# Patient Record
Sex: Male | Born: 1955 | Race: Black or African American | Hispanic: No | Marital: Married | Smoking: Never smoker
Health system: Southern US, Community
[De-identification: ages and names within clinical notes are randomized; demographics above are authoritative.]

## PROBLEM LIST (undated history)

## (undated) DIAGNOSIS — N200 Calculus of kidney: Secondary | ICD-10-CM

## (undated) DIAGNOSIS — R12 Heartburn: Secondary | ICD-10-CM

## (undated) DIAGNOSIS — F419 Anxiety disorder, unspecified: Secondary | ICD-10-CM

## (undated) DIAGNOSIS — M1990 Unspecified osteoarthritis, unspecified site: Secondary | ICD-10-CM

## (undated) DIAGNOSIS — I10 Essential (primary) hypertension: Secondary | ICD-10-CM

## (undated) DIAGNOSIS — R319 Hematuria, unspecified: Secondary | ICD-10-CM

## (undated) DIAGNOSIS — K859 Acute pancreatitis without necrosis or infection, unspecified: Secondary | ICD-10-CM

## (undated) DIAGNOSIS — F329 Major depressive disorder, single episode, unspecified: Secondary | ICD-10-CM

## (undated) DIAGNOSIS — F32A Depression, unspecified: Secondary | ICD-10-CM

## (undated) DIAGNOSIS — E785 Hyperlipidemia, unspecified: Secondary | ICD-10-CM

## (undated) HISTORY — PX: COLONOSCOPY: SHX174

## (undated) HISTORY — DX: Acute pancreatitis without necrosis or infection, unspecified: K85.90

## (undated) HISTORY — DX: Heartburn: R12

## (undated) HISTORY — DX: Calculus of kidney: N20.0

## (undated) HISTORY — DX: Anxiety disorder, unspecified: F41.9

## (undated) HISTORY — DX: Hematuria, unspecified: R31.9

## (undated) HISTORY — PX: NO PAST SURGERIES: SHX2092

## (undated) HISTORY — DX: Hyperlipidemia, unspecified: E78.5

## (undated) HISTORY — DX: Major depressive disorder, single episode, unspecified: F32.9

## (undated) HISTORY — DX: Unspecified osteoarthritis, unspecified site: M19.90

## (undated) HISTORY — DX: Depression, unspecified: F32.A

---

## 2012-03-15 ENCOUNTER — Emergency Department: Payer: Self-pay | Admitting: Emergency Medicine

## 2013-03-08 DIAGNOSIS — N433 Hydrocele, unspecified: Secondary | ICD-10-CM | POA: Insufficient documentation

## 2013-03-08 DIAGNOSIS — N403 Nodular prostate with lower urinary tract symptoms: Secondary | ICD-10-CM | POA: Insufficient documentation

## 2013-03-08 DIAGNOSIS — N138 Other obstructive and reflux uropathy: Secondary | ICD-10-CM | POA: Insufficient documentation

## 2013-03-22 DIAGNOSIS — N411 Chronic prostatitis: Secondary | ICD-10-CM | POA: Insufficient documentation

## 2013-03-22 DIAGNOSIS — K4020 Bilateral inguinal hernia, without obstruction or gangrene, not specified as recurrent: Secondary | ICD-10-CM | POA: Insufficient documentation

## 2014-12-06 DIAGNOSIS — R1906 Epigastric swelling, mass or lump: Secondary | ICD-10-CM | POA: Insufficient documentation

## 2015-08-25 DIAGNOSIS — M19012 Primary osteoarthritis, left shoulder: Secondary | ICD-10-CM

## 2015-08-25 DIAGNOSIS — M19011 Primary osteoarthritis, right shoulder: Secondary | ICD-10-CM | POA: Insufficient documentation

## 2015-08-27 DIAGNOSIS — R7303 Prediabetes: Secondary | ICD-10-CM | POA: Insufficient documentation

## 2015-10-03 DIAGNOSIS — R768 Other specified abnormal immunological findings in serum: Secondary | ICD-10-CM | POA: Insufficient documentation

## 2015-11-29 DIAGNOSIS — R339 Retention of urine, unspecified: Secondary | ICD-10-CM | POA: Insufficient documentation

## 2016-08-12 ENCOUNTER — Emergency Department: Payer: BLUE CROSS/BLUE SHIELD

## 2016-08-12 ENCOUNTER — Encounter: Payer: Self-pay | Admitting: Emergency Medicine

## 2016-08-12 ENCOUNTER — Emergency Department
Admission: EM | Admit: 2016-08-12 | Discharge: 2016-08-12 | Disposition: A | Discharge status: Home / Self Care | Payer: BLUE CROSS/BLUE SHIELD | Attending: Emergency Medicine | Admitting: Emergency Medicine

## 2016-08-12 DIAGNOSIS — I10 Essential (primary) hypertension: Secondary | ICD-10-CM | POA: Insufficient documentation

## 2016-08-12 DIAGNOSIS — F17210 Nicotine dependence, cigarettes, uncomplicated: Secondary | ICD-10-CM | POA: Insufficient documentation

## 2016-08-12 DIAGNOSIS — J390 Retropharyngeal and parapharyngeal abscess: Secondary | ICD-10-CM | POA: Insufficient documentation

## 2016-08-12 DIAGNOSIS — J36 Peritonsillar abscess: Secondary | ICD-10-CM

## 2016-08-12 DIAGNOSIS — Z79899 Other long term (current) drug therapy: Secondary | ICD-10-CM | POA: Insufficient documentation

## 2016-08-12 HISTORY — DX: Essential (primary) hypertension: I10

## 2016-08-12 LAB — CBC WITH DIFFERENTIAL/PLATELET
Basophils Absolute: 0 10*3/uL (ref 0–0.1)
Basophils Relative: 0 %
Eosinophils Absolute: 0.2 10*3/uL (ref 0–0.7)
Eosinophils Relative: 2 %
HCT: 43.6 % (ref 40.0–52.0)
Hemoglobin: 14.9 g/dL (ref 13.0–18.0)
Lymphocytes Relative: 19 %
Lymphs Abs: 1.5 10*3/uL (ref 1.0–3.6)
MCH: 30.3 pg (ref 26.0–34.0)
MCHC: 34.1 g/dL (ref 32.0–36.0)
MCV: 88.8 fL (ref 80.0–100.0)
Monocytes Absolute: 0.9 10*3/uL (ref 0.2–1.0)
Monocytes Relative: 11 %
Neutro Abs: 5.4 10*3/uL (ref 1.4–6.5)
Neutrophils Relative %: 68 %
Platelets: 282 10*3/uL (ref 150–440)
RBC: 4.91 MIL/uL (ref 4.40–5.90)
RDW: 14.2 % (ref 11.5–14.5)
WBC: 8.1 10*3/uL (ref 3.8–10.6)

## 2016-08-12 LAB — COMPREHENSIVE METABOLIC PANEL
ALT: 23 U/L (ref 17–63)
AST: 21 U/L (ref 15–41)
Albumin: 3.8 g/dL (ref 3.5–5.0)
Alkaline Phosphatase: 57 U/L (ref 38–126)
Anion gap: 7 (ref 5–15)
BUN: 17 mg/dL (ref 6–20)
CO2: 26 mmol/L (ref 22–32)
Calcium: 8.9 mg/dL (ref 8.9–10.3)
Chloride: 103 mmol/L (ref 101–111)
Creatinine, Ser: 1.04 mg/dL (ref 0.61–1.24)
GFR calc Af Amer: >60 mL/min (ref >60)
GFR calc non Af Amer: >60 mL/min (ref >60)
Glucose, Bld: 101 mg/dL — ABNORMAL HIGH (ref 65–99)
Potassium: 3.7 mmol/L (ref 3.5–5.1)
Sodium: 136 mmol/L (ref 135–145)
Total Bilirubin: 0.8 mg/dL (ref 0.3–1.2)
Total Protein: 7 g/dL (ref 6.5–8.1)

## 2016-08-12 MED ORDER — METHYLPREDNISOLONE SODIUM SUCC 125 MG IJ SOLR
125.0000 mg | Freq: Once | INTRAMUSCULAR | Status: AC
  Administered 2016-08-12: 125 mg via INTRAVENOUS

## 2016-08-12 MED ORDER — SODIUM CHLORIDE 0.9 % IV SOLN
3.0000 g | Freq: Once | INTRAVENOUS | Status: AC
  Administered 2016-08-12: 3 g via INTRAVENOUS
  Filled 2016-08-12: qty 3

## 2016-08-12 MED ORDER — IOPAMIDOL (ISOVUE-300) INJECTION 61%
75.0000 mL | Freq: Once | INTRAVENOUS | Status: AC | PRN
  Administered 2016-08-12: 75 mL via INTRAVENOUS
  Filled 2016-08-12: qty 75

## 2016-08-12 MED ORDER — AMOXICILLIN-POT CLAVULANATE 875-125 MG PO TABS: 1.0000 | ORAL_TABLET | Freq: Two times a day (BID) | ORAL | 0 refills | Status: AC

## 2016-08-12 MED ORDER — METHYLPREDNISOLONE SODIUM SUCC 125 MG IJ SOLR
125.0000 mg | Freq: Once | INTRAMUSCULAR | Status: DC
  Filled 2016-08-12: qty 2

## 2016-08-12 MED ORDER — AMOXICILLIN 500 MG PO CAPS: 500.0000 mg | ORAL_CAPSULE | Freq: Two times a day (BID) | ORAL | 0 refills | Status: AC

## 2016-08-12 MED ORDER — LIDOCAINE VISCOUS 2 % MT SOLN
15.0000 mL | Freq: Once | OROMUCOSAL | Status: AC
  Administered 2016-08-12: 15 mL via OROMUCOSAL
  Filled 2016-08-12: qty 15

## 2016-08-12 NOTE — ED Triage Notes (Signed)
Patient presents to the ED with severe sore throat since yesterday.  Patient is speaking clearly and maintaining secretions but appears uncomfortable.  Patient states, "it hurts to eat ice cream."

## 2016-08-12 NOTE — ED Provider Notes (Signed)
[facility] Emergency Department Provider Note  ____________________________________________  Time seen: Approximately 11:15 PM  I have reviewed the triage vital signs and the nursing notes.   HISTORY  Chief Complaint Sore Throat    HPI Carlos Reynolds is a 61 y.o. male presents to the emergency department with 10 out of 10 pharyngitis that became noticeable suddenly while patient was driving home last night. Patient states that he has had difficulty swallowing. He has noticed no voice changes. She is speaking in complete sentences and managing his own secretions. Patient's daughter states that patient had chills last night. Patient denies associated chest pain, chest tightness, shortness of breath, nausea, vomiting and abdominal pain. No alleviating measures have been attempted.   Past Medical History:  Diagnosis Date  . Hypertension     There are no active problems to display for this patient.   History reviewed. No pertinent surgical history.  Prior to Admission medications   Medication Sig Start Date End Date Taking? Authorizing Provider  hydrochlorothiazide (HYDRODIURIL) 25 MG tablet Take 25 mg by mouth daily.   Yes [provider]  meloxicam (MOBIC) 7.5 MG tablet Take 7.5 mg by mouth daily.   Yes [provider]  omeprazole (PRILOSEC) 40 MG capsule Take 40 mg by mouth daily.   Yes [provider]  tamsulosin (FLOMAX) 0.4 MG CAPS capsule Take 0.4 mg by mouth.   Yes [provider]  amoxicillin (AMOXIL) 500 MG capsule Take 1 capsule (500 mg total) by mouth 2 (two) times daily. 08/12/16 08/22/16  Orvil Feil, PA-C  amoxicillin-clavulanate (AUGMENTIN) 875-125 MG tablet Take 1 tablet by mouth 2 (two) times daily. 08/12/16 08/22/16  Orvil Feil, PA-C    Allergies Patient has no known allergies.  No family history on file.  Social History Social History  Substance Use Topics  . Smoking status: Current Every  Day Smoker    Packs/day: 0.50    Types: Cigarettes  . Smokeless tobacco: Never Used  . Alcohol use Yes     Comment: occasionally     Review of Systems  Constitutional: Patient has chills.  Eyes: No visual changes. No discharge ENT: Patient has pharyngitis.  Cardiovascular: no chest pain. Respiratory: no cough. No SOB. Gastrointestinal: No abdominal pain.  No nausea, no vomiting.  No diarrhea.  No constipation. Musculoskeletal: Negative for musculoskeletal pain. Skin: Negative for rash, abrasions, lacerations, ecchymosis. Neurological: Negative for headaches, focal weakness or numbness. ____________________________________________   PHYSICAL EXAM:  VITAL SIGNS: ED Triage Vitals  Enc Vitals Group     BP 08/12/16 1845 134/85     Pulse Rate 08/12/16 1845 83     Resp 08/12/16 1845 20     Temp 08/12/16 1845 98.8 F (37.1 C)     Temp Source 08/12/16 1845 Oral     SpO2 08/12/16 1845 98 %     Weight 08/12/16 1832 196 lb (88.9 kg)     Height 08/12/16 1832 5\' 9"  (1.753 m)     Head Circumference --      Peak Flow --      Pain Score 08/12/16 1832 10     Pain Loc --      Pain Edu? --      Excl. in GC? --     Constitutional: Alert and oriented. Well appearing and in no acute distress. Eyes: Conjunctivae are normal. PERRL. EOMI. Head: Atraumatic. ENT:      Mouth/Throat:  Posterior pharynx is mildly erythematous. Patient's left tonsil is  slightly larger than right. Uvula is midline. Cardiovascular: Normal rate, regular rhythm. Normal S1 and S2.  Good peripheral circulation. Respiratory: Normal respiratory effort without tachypnea or retractions. Lungs CTAB. Good air entry to the bases with no decreased or absent breath sounds. Musculoskeletal: Full range of motion to all extremities. No gross deformities appreciated. Neurologic:  Normal speech and language. No gross focal neurologic deficits are appreciated.  Skin:  Skin is warm, dry and intact. No rash noted. Psychiatric: Mood  and affect are normal. Speech and behavior are normal. Patient exhibits appropriate insight and judgement.  ____________________________________________   LABS (all labs ordered are listed, but only abnormal results are displayed)  Labs Reviewed  COMPREHENSIVE METABOLIC PANEL - Abnormal; Notable for the following:       Result Value   Glucose, Bld 101 (*)    All other components within normal limits  CBC WITH DIFFERENTIAL/PLATELET   ____________________________________________  EKG   ____________________________________________  RADIOLOGY Geraldo Pitter, personally viewed and evaluated these images (plain radiographs) as part of my medical decision making, as well as reviewing the written report by the radiologist.  Ct Soft Tissue Neck W Contrast  Result Date: 08/12/2016 CLINICAL DATA:  Severe sore throat for 2 days. Assess for retropharyngeal abscess. EXAM: CT NECK WITH CONTRAST TECHNIQUE: Multidetector CT imaging of the neck was performed using the standard protocol following the bolus administration of intravenous contrast. CONTRAST:  75mL ISOVUE-300 IOPAMIDOL (ISOVUE-300) INJECTION 61% COMPARISON:  None. FINDINGS: PHARYNX AND LARYNX: Enhancing 17 x 18 x 31 mm LEFT palatine tonsil mucosal to submucosal density extending to lingual tonsil. Mild stranding LEFT parapharyngeal fat tissue planes. 6 mm rim enhancing nodule axial 35/106. LEFT hypopharyngeal edema extending to LEFT glossopharyngeal old with effacement LEFT piriform sinus and, LEFT aryepiglottic fold edema. 10 x 11 mm nodule LEFT paraglottic fat. Small LEFT lateral neck and retropharyngeal effusion. Normal larynx. SALIVARY GLANDS: Normal. THYROID: Normal. LYMPH NODES: No lymphadenopathy by CT size criteria. VASCULAR: Mild calcific atherosclerosis of the aortic arch. LIMITED INTRACRANIAL: Normal. VISUALIZED ORBITS: Normal. MASTOIDS AND VISUALIZED PARANASAL SINUSES: Trace paranasal sinus mucosal thickening. Mastoid air cells are  well aerated. SKELETON: Nonacute.  Poor dentition. UPPER CHEST: Centrilobular emphysema, incompletely evaluated. No superior mediastinal lymphadenopathy. OTHER: None. IMPRESSION: LEFT tonsillitis and LEFT pharyngitis. 6 mm LEFT peritonsillar abscess. Small retropharyngeal and LEFT neck effusion. Recommend follow-up to exclude obstructing LEFT tonsillar mass. Patent airway. Electronically Signed   By: Awilda Metro M.D.   On: 08/12/2016 21:32    ____________________________________________    PROCEDURES  Procedure(s) performed:    Procedures    Medications  methylPREDNISolone sodium succinate (SOLU-MEDROL) 125 mg/2 mL injection 125 mg (125 mg Intravenous Given 08/12/16 1933)  iopamidol (ISOVUE-300) 61 % injection 75 mL (75 mLs Intravenous Contrast Given 08/12/16 2039)  lidocaine (XYLOCAINE) 2 % viscous mouth solution 15 mL (15 mLs Mouth/Throat Given 08/12/16 2105)  Ampicillin-Sulbactam (UNASYN) 3 g in sodium chloride 0.9 % 100 mL IVPB (0 g Intravenous Stopped 08/12/16 2249)     ____________________________________________   INITIAL IMPRESSION / ASSESSMENT AND PLAN / ED COURSE  Pertinent labs & imaging results that were available during my care of the patient were reviewed by me and considered in my medical decision making (see chart for details).  Review of the [place] CSRS was performed in accordance of the [facility] prior to dispensing any controlled drugs.     Assessment and plan: Retropharyngeal abscess Peritonsillar abscess Patient presents to the emergency department with pharyngitis. CBC and CMP were  reassuring. CT soft tissue neck reveals small peritonsillar and retropharyngeal abscesses. Dr. Elenore RotaJuengel was consulted. Dr. Elenore RotaJuengel reviewed the patient's case. Patient was discharged with amoxicillin and Augmentin and advised to follow-up with Dr.Juengel in 10 days. Patient was given Unasyn and Solu-Medrol in the emergency department. Vitals were reassuring prior to discharge. All  patient questions were answered.  ____________________________________________  FINAL CLINICAL IMPRESSION(S) / ED DIAGNOSES  Final diagnoses:  Retropharyngeal abscess  Peritonsillar abscess      NEW MEDICATIONS STARTED DURING THIS VISIT:  New Prescriptions   AMOXICILLIN (AMOXIL) 500 MG CAPSULE    Take 1 capsule (500 mg total) by mouth 2 (two) times daily.   AMOXICILLIN-CLAVULANATE (AUGMENTIN) 875-125 MG TABLET    Take 1 tablet by mouth 2 (two) times daily.        This chart was dictated using voice recognition software/Dragon. Despite best efforts to proofread, errors can occur which can change the meaning. Any change was purely unintentional.    Gasper LloydWoods, Maika Kaczmarek M, PA-C 08/12/16 2334    Phineas SemenGoodman, Gray[place], MD 08/12/16 [phone]

## 2016-08-12 NOTE — ED Notes (Signed)
Pt discharged to home.  Family member driving.  Discharge instructions reviewed.  Verbalized understanding.  No questions or concerns at this time.  Teach back verified.  Pt in NAD.  No items left in ED.   

## 2016-08-12 NOTE — ED Notes (Signed)
See triage note.states he developed sore throat since last pm   States pain is worse today  Having some diff swallowing fluids at this time

## 2016-11-18 ENCOUNTER — Ambulatory Visit (INDEPENDENT_AMBULATORY_CARE_PROVIDER_SITE_OTHER): Payer: Self-pay | Admitting: Nurse Practitioner

## 2016-11-18 ENCOUNTER — Encounter: Payer: Self-pay | Admitting: Nurse Practitioner

## 2016-11-18 VITALS — BP 116/71 | HR 59 | Temp 98.1°F | Ht 70.0 in | Wt 209.8 lb

## 2016-11-18 DIAGNOSIS — M19012 Primary osteoarthritis, left shoulder: Secondary | ICD-10-CM

## 2016-11-18 DIAGNOSIS — R3914 Feeling of incomplete bladder emptying: Secondary | ICD-10-CM

## 2016-11-18 DIAGNOSIS — F321 Major depressive disorder, single episode, moderate: Secondary | ICD-10-CM

## 2016-11-18 DIAGNOSIS — L309 Dermatitis, unspecified: Secondary | ICD-10-CM

## 2016-11-18 DIAGNOSIS — N529 Male erectile dysfunction, unspecified: Secondary | ICD-10-CM

## 2016-11-18 DIAGNOSIS — Z7689 Persons encountering health services in other specified circumstances: Secondary | ICD-10-CM

## 2016-11-18 DIAGNOSIS — N401 Enlarged prostate with lower urinary tract symptoms: Secondary | ICD-10-CM

## 2016-11-18 MED ORDER — FLUOXETINE HCL 20 MG PO CAPS: 20.0000 mg | ORAL_CAPSULE | Freq: Every day | ORAL | 3 refills | Status: DC

## 2016-11-18 MED ORDER — TAMSULOSIN HCL 0.4 MG PO CAPS: 0.8000 mg | ORAL_CAPSULE | Freq: Every day | ORAL | 5 refills | Status: DC

## 2016-11-18 MED ORDER — TRIAMCINOLONE ACETONIDE 0.025 % EX OINT: 1.0000 "application " | TOPICAL_OINTMENT | Freq: Two times a day (BID) | CUTANEOUS | 0 refills | Status: DC

## 2016-11-18 NOTE — Patient Instructions (Addendum)
Arnett, Thank you for coming in to clinic today.  1. For your prostate: - I have sent a referral to [facility]. [facility] [address] [place] [address] Phone: [phone]  2. For your shoulder:  - I would recommend orthopedics for consideration of surgery. - In the meantime, we will send you for pain management. [facility] Address: [address] [place], [place], [place] [address] Phone: [phone]  - Continue your diclofenac and acetaminophen only and rubs.  PSYCHIATRY / THERAPY-COUSENLING [facility]) [place] [address], [place], [place] [address] Phone: [phone]  [employer], available walk-in 9am-4pm M-F [address] [place], [place] [address] Hours: 9am - 4pm (M-F, walk in available) [phone]  [facility] Department [facility]   Address: [address] [place], [place], [place] [address] Hours: Tues 4:15PM-8PM // Weds 9am - 12pm // Magdalene Molly 1pm - 8pm (Closed other days) Phone: [phone]  Please schedule a follow-up appointment with Wilhelmina Mcardle, AGNP. Return in about 3 months (around 02/17/2017) for Hypertension.   If you have any other questions or concerns, please feel free to call the clinic or send a message through MyChart. You may also schedule an earlier appointment if necessary.  You will receive a survey after today's visit either digitally by e-mail or paper by [employer]. Your experiences and feedback matter to [place].  Please respond so we know how we are doing as we provide care for you.   Wilhelmina Mcardle, DNP, AGNP-BC Adult Gerontology Nurse Practitioner [facility], [facility]

## 2016-11-18 NOTE — Progress Notes (Signed)
Subjective:    Patient ID: Carlos Reynolds, male    DOB: 1955/07/25, 61 y.o.   MRN: [id number]  Carlos Reynolds is a 61 y.o. male presenting on 11/18/2016 for Establish Care (bilateral shoulder and hand pain, the pt think its associated w/ arthritis ) and Prostatitis (chronic prostatis, BPH. pt would like a referral to Urology )   HPI Establish Care New Provider Pt last seen by PCP about 1 year ago.  Obtain records from [facility].   Shoulder L>R and Hand Pain - Bilateral Pt believes pain is associated w/ his arthritis.  - Cortisone injections not helpful. - Has had difficulty w/ this for several years.   - Something wrong w/ elbow - can hear grinding, has had xray in past w/ "chipped bones." DIfficult to make fist in hands. - Has tried multiple rubs, Icy Hot, voltaren gel, lidocaine creams/sprays/patches.  No relief of pain. 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  He has worked recently as a Health visitor.  He previously worked Engineer, production. - Affects ability to use arms/cannot currently work.  Has not worked in about 10 months.  Has trouble buttoning top collar, doing manual work at home. - Sleep disrupted w/ pain and when moves, his arm pops (w/in joint) not out of socket (a "catch" that prevents movement).  This causes severe pain and takes time to resolve before sleep onset. Often needs wife to massage shoulder for resolution of pain.  Rash on neck: in different places - only during fall/winter (no change in products)  Prostatitis/ED Has had BPH and would like a referral to Urology. No erection w/o pain, and even then symptom onset about 3 years ago.   - After cystoscopy, has had symptoms. - gets up 2-3 times at night to void w/ limited flow, dribbling, double voiding. - Occurs every night. - has frequency and urgency (occasional incontinence) - Wears gym shorts/pajamas at home.     Depression Screening Patient notes  significant difficulty since being out of work related to shoulder pain, w/ ED/painful erections.  Does have days where he feels he would be better off dead, but has not developed a plan to kill himself.  Depression screen PHQ 2/9 11/18/2016  Decreased Interest 3  Down, Depressed, Hopeless 3  PHQ - 2 Score 6  Altered sleeping 3  Tired, decreased energy 3  Change in appetite 3  Feeling bad or failure about yourself  3  Trouble concentrating 3  Moving slowly or fidgety/restless 2  Suicidal thoughts 3  PHQ-9 Score 26  Difficult doing work/chores Very difficult   GAD 7 : Generalized Anxiety Score 11/18/2016  Nervous, Anxious, on Edge 3  Control/stop worrying 3  Worry too much - different things 3  Trouble relaxing 3  Restless 3  Easily annoyed or irritable 3  Afraid - awful might happen 3  Total GAD 7 Score 21  Anxiety Difficulty Very difficult    Past Medical History:  Diagnosis Date  . Hypertension    History reviewed. No pertinent surgical history. Social History   Social History  . Marital status: Married    Spouse name: N/A  . Number of children: N/A  . Years of education: N/A   Occupational History  . Not on file.   Social History Main Topics  . Smoking status: Current Every Day Smoker    Packs/day: 0.50    Years: 40.00    Types: Cigarettes  . Smokeless tobacco: Never Used  .  Alcohol use Yes     Comment: occasionally  . Drug use: No  . Sexual activity: Not Currently   Other Topics Concern  . Not on file   Social History Narrative  . No narrative on file   Family History  Problem Relation Age of Onset  . Heart disease Mother   . Colon cancer Mother   . Diabetes Father   . Prostate cancer Father   . Colon cancer Brother   . Heart disease Brother    Current Outpatient Prescriptions on File Prior to Visit  Medication Sig  . hydrochlorothiazide (HYDRODIURIL) 25 MG tablet Take 25 mg by mouth daily.   No current facility-administered medications on  file prior to visit.     Review of Systems  All other systems reviewed and are negative.  Per HPI unless specifically indicated above     Objective:    BP 116/71 (BP Location: Right Arm, Patient Position: Sitting, Cuff Size: Normal)   Pulse (!) 59   Temp 98.1 F (36.7 C) (Oral)   Ht  (1.778 m)   Wt 209 lb 12.8 oz (95.2 kg)   BMI 30.10 kg/m    Wt Readings from Last 3 Encounters:  11/18/16 209 lb 12.8 oz (95.2 kg)  08/12/16 196 lb (88.9 kg)    Physical Exam General - healthy, well-appearing, NAD HEENT - Normocephalic, atraumatic, PERRL, EOMI, patent nares w/o congestion, oropharynx clear, MMM Neck - supple, non-tender, no LAD, pustular rash w/ flakiness on left neck (c/w eczema) Heart - RRR, no murmurs heard Lungs - Clear throughout all lobes, no wheezing, crackles, or rhonchi. Normal work of breathing. Musculoskeletal - Bilateral Shoulder Inspection: Left shoulder w/ muscle atrophy (at rest, left shoulder lower than right)  Palpation: Non-tender to palpation over anterior, lateral, or posterior shoulder ROM: left shoulder w/ severely limited  ROM forward flexion/abduction at 90 degrees, posterior flexion 0 degrees, internal / external rotation normal.  Right shoulder w/ minimal limitation of ROM in all directions, but pain at extremes. Strength: Normal strength 4/5 flex/ext, ext rot / int rot, grip, pincer grasp 5/5 strength. Neurovascular: Distally intact pulses, sensation to light touch Extremeties - non-tender, no edema, cap refill < 2 seconds, peripheral pulses intact +2 bilaterally Skin - warm, dry Neuro - awake, alert, oriented x3, CN II-X intact, intact muscle strength 5/5 bilaterally, intact distal sensation to light touch, normal coordination, normal gait Psych - Depressed mood and normal affect, normal behavior   Results for orders placed or performed during the hospital encounter of 08/12/16  CBC with Differential  Result Value Ref Range   WBC 8.1 3.8 -  10.6 K/uL   RBC 4.91 4.40 - 5.90 MIL/uL   Hemoglobin 14.9 13.0 - 18.0 g/dL   HCT 16.1 09.6 - 04.5 %   MCV 88.8 80.0 - 100.0 fL   MCH 30.3 26.0 - 34.0 pg   MCHC 34.1 32.0 - 36.0 g/dL   RDW 40.9 81.1 - 91.4 %   Platelets 282 150 - 440 K/uL   Neutrophils Relative % 68 %   Neutro Abs 5.4 1.4 - 6.5 K/uL   Lymphocytes Relative 19 %   Lymphs Abs 1.5 1.0 - 3.6 K/uL   Monocytes Relative 11 %   Monocytes Absolute 0.9 0.2 - 1.0 K/uL   Eosinophils Relative 2 %   Eosinophils Absolute 0.2 0 - 0.7 K/uL   Basophils Relative 0 %   Basophils Absolute 0.0 0 - 0.1 K/uL  Comprehensive metabolic panel  Result  Value Ref Range   Sodium 136 135 - 145 mmol/L   Potassium 3.7 3.5 - 5.1 mmol/L   Chloride 103 101 - 111 mmol/L   CO2 26 22 - 32 mmol/L   Glucose, Bld 101 (H) 65 - 99 mg/dL   BUN 17 6 - 20 mg/dL   Creatinine, Ser 1.61 0.61 - 1.24 mg/dL   Calcium 8.9 8.9 - 09.6 mg/dL   Total Protein 7.0 6.5 - 8.1 g/dL   Albumin 3.8 3.5 - 5.0 g/dL   AST 21 15 - 41 U/L   ALT 23 17 - 63 U/L   Alkaline Phosphatase 57 38 - 126 U/L   Total Bilirubin 0.8 0.3 - 1.2 mg/dL   GFR calc non Af Amer >60 >60 mL/min   GFR calc Af Amer >60 >60 mL/min   Anion gap 7 5 - 15      Assessment & Plan:   Problem List Items Addressed This Visit      Musculoskeletal and Integument   Osteoarthritis of left shoulder - Primary    Severe arthritis in left shoulder w/ severely limited ROM and constant pain preventing activity and interfering with sleep.  Patient is likely out of viable treatment options for pain outside of shoulder joint replacement.  Has previously been evaluated by orthopedics and is not currently desiring surgery.  Plan: 1. Pain management clinic referral.  Pt currently declines orthopedic management.  May benefit from epidural procedures for pain. 2. Continue oral diclofenac and acetaminophen.  Advised to avoid all other NSAIDs. 3. Continue topical diclofenac.  4. Follow up as needed.      Relevant  Medications   diclofenac (VOLTAREN) 75 MG EC tablet   Other Relevant Orders   Ambulatory referral to Pain Clinic   Eczema    Rash and skin changes c/w eczema.  Mild reaction at this time.    Plan: 1. Start triamcinolone ointment 0.025% topically bid prn for eruptions of eczema. 2. Follow up as needed.        Genitourinary   Benign prostatic hyperplasia with incomplete bladder emptying    Worsening of symptoms since last urology visit.  Pt now w/ LUTS symptoms persistently throughout day and at night.  Has incomplete emptying, urgency, frequency, occasional incontinence, nocturia.  Plan: 1. Continue flomax. 2. Referral to [facility] to Dr. Apolinar Junes.  Pt requested male provider.  Has previously been seen by [facility], but desires care closer to his home in [place]. 3. Followup as needed.      Relevant Medications   tamsulosin (FLOMAX) 0.4 MG CAPS capsule   Other Relevant Orders   Ambulatory referral to Urology   Erectile dysfunction    Pt w/ painful erection and incomplete ability to obtain erection.  Is significant dissatisfier to his wife for sexual intimacy.   Plan: 1. Referral to urology.  Encouraged ongoing management of BPH and depression as possible causes.        Other   Current moderate episode of major depressive disorder without prior episode (HCC)    Moderate depression.  Complicating factors are inability to work r/t left shoulder arthritis, ED, BPH and incontinence symptoms. Pt states he has experienced SI, but has had no plan to carry out.  Significant anhedonia expressed w/ sleep disruption from multiple factors. PHQ9 and GAD7 scores indicate severe depression and severe anxiety.  Plan: 1. START fluoxetine 20 mg once daily. 2. Encouraged stress management strategies. 3. Recommended psychiatry for walk-in if experiencing SI regularly.  Also discussed  need to call 9-1-1 if develops SI w/ plan. 4. Recommend counseling also to assist pt w/ coping strategies for  life stressors - references provided. 5. Follow up 4 weeks.      Relevant Medications   FLUoxetine (PROZAC) 20 MG capsule    Other Visit Diagnoses    Encounter to establish care       Patient history reviewed.  Recent records available in [facility] Everywhere.      Meds ordered this encounter  Medications  . diclofenac (VOLTAREN) 75 MG EC tablet    Sig: TAKE 1 TABLET BY MOUTH TWICE DAILY WITH FOOD AS NEEDED FOR PAIN  . omeprazole (PRILOSEC) 20 MG capsule    Sig: TAKE 1 CAPSULE (20 MG) BY MOUTH ONCE DAILY  . DISCONTD: tamsulosin (FLOMAX) 0.4 MG CAPS capsule    Sig: Take 2 capsules (0.8 mg total) by mouth daily.    Dispense:  60 capsule    Refill:  5  . tamsulosin (FLOMAX) 0.4 MG CAPS capsule    Sig: Take 2 capsules (0.8 mg total) by mouth daily.    Dispense:  60 capsule    Refill:  5  . triamcinolone (KENALOG) 0.025 % ointment    Sig: Apply 1 application topically 2 (two) times daily.    Dispense:  30 g    Refill:  0  . FLUoxetine (PROZAC) 20 MG capsule    Sig: Take 1 capsule (20 mg total) by mouth daily.    Dispense:  30 capsule    Refill:  3     Follow up plan: Return in about 3 months (around 02/17/2017) for Hypertension. and 4 weeks depression.   Wilhelmina Mcardle, DNP, AGPCNP-BC Adult Gerontology Primary Care Nurse Practitioner [facility] 11/21/2016, 11:56 PM

## 2016-11-21 ENCOUNTER — Other Ambulatory Visit: Payer: Self-pay

## 2016-11-21 DIAGNOSIS — N401 Enlarged prostate with lower urinary tract symptoms: Secondary | ICD-10-CM | POA: Insufficient documentation

## 2016-11-21 DIAGNOSIS — L309 Dermatitis, unspecified: Secondary | ICD-10-CM | POA: Insufficient documentation

## 2016-11-21 DIAGNOSIS — R3914 Feeling of incomplete bladder emptying: Principal | ICD-10-CM

## 2016-11-21 DIAGNOSIS — F321 Major depressive disorder, single episode, moderate: Secondary | ICD-10-CM | POA: Insufficient documentation

## 2016-11-21 DIAGNOSIS — N529 Male erectile dysfunction, unspecified: Secondary | ICD-10-CM | POA: Insufficient documentation

## 2016-11-21 DIAGNOSIS — M19012 Primary osteoarthritis, left shoulder: Secondary | ICD-10-CM | POA: Insufficient documentation

## 2016-11-21 NOTE — Assessment & Plan Note (Addendum)
Rash and skin changes c/w eczema.  Mild reaction at this time.    Plan: 1. Start triamcinolone ointment 0.025% topically bid prn for eruptions of eczema. 2. Follow up as needed.

## 2016-11-21 NOTE — Assessment & Plan Note (Signed)
Worsening of symptoms since last urology visit.  Pt now w/ LUTS symptoms persistently throughout day and at night.  Has incomplete emptying, urgency, frequency, occasional incontinence, nocturia.  Plan: 1. Continue flomax. 2. Referral to [facility] to Dr. Apolinar Junes.  Pt requested male provider.  Has previously been seen by [facility], but desires care closer to his home in [place]. 3. Followup as needed.

## 2016-11-21 NOTE — Assessment & Plan Note (Signed)
Moderate depression.  Complicating factors are inability to work r/t left shoulder arthritis, ED, BPH and incontinence symptoms. Pt states he has experienced SI, but has had no plan to carry out.  Significant anhedonia expressed w/ sleep disruption from multiple factors. PHQ9 and GAD7 scores indicate severe depression and severe anxiety.  Plan: 1. START fluoxetine 20 mg once daily. 2. Encouraged stress management strategies. 3. Recommended psychiatry for walk-in if experiencing SI regularly.  Also discussed need to call 9-1-1 if develops SI w/ plan. 4. Recommend counseling also to assist pt w/ coping strategies for life stressors - references provided. 5. Follow up 4 weeks.

## 2016-11-21 NOTE — Telephone Encounter (Signed)
Open in error

## 2016-11-21 NOTE — Assessment & Plan Note (Signed)
Severe arthritis in left shoulder w/ severely limited ROM and constant pain preventing activity and interfering with sleep.  Patient is likely out of viable treatment options for pain outside of shoulder joint replacement.  Has previously been evaluated by orthopedics and is not currently desiring surgery.  Plan: 1. Pain management clinic referral.  Pt currently declines orthopedic management.  May benefit from epidural procedures for pain. 2. Continue oral diclofenac and acetaminophen.  Advised to avoid all other NSAIDs. 3. Continue topical diclofenac.  4. Follow up as needed.

## 2016-11-21 NOTE — Assessment & Plan Note (Signed)
Pt w/ painful erection and incomplete ability to obtain erection.  Is significant dissatisfier to his wife for sexual intimacy.   Plan: 1. Referral to urology.  Encouraged ongoing management of BPH and depression as possible causes.

## 2016-11-29 NOTE — Progress Notes (Signed)
12/02/2016 2:22 PM   Carlos Reynolds [birth year] [id number]  Referring provider: Galen Manila, NP [address] [place], [place] [address]  Chief Complaint  Patient presents with  . New Patient (Initial Visit)    BPH referred by Wilhelmina Mcardle NP    HPI: Patient is a 61 year old African-American male with BPH and LUT S and erectile dysfunction who is referred by Orson Ape NP for further evaluation and management.  BPH WITH LUTS  (prostate and/or bladder) His IPSS score today is 17, which is moderate lower urinary tract symptomatology.  He is unhappy with his quality life due to his urinary symptoms.  His PVR is 27 mL.  His major complaints today are frequency, urgency, nocturia x 2-3, urge incontinence, intermittency, hesitancy, straining to urinate and a weak urinary stream  He has had these symptoms for two months.  He denies any dysuria, hematuria or suprapubic pain.   He currently taking tamsulosin 0.4 mg.   He also denies any recent fevers, chills, nausea or vomiting.  Father was was 53 yo when diagnosed with fatal prostate cancer.        IPSS    Row Name 12/02/16 1300         International Prostate Symptom Score   How often have you had the sensation of not emptying your bladder? About half the time     How often have you had to urinate less than every two hours? Less than half the time     How often have you found you stopped and started again several times when you urinated? Less than half the time     How often have you found it difficult to postpone urination? Almost always     How often have you had a weak urinary stream? About half the time     How often have you had to strain to start urination? Not at All     How many times did you typically get up at night to urinate? 2 Times     Total IPSS Score 17       Quality of Life due to urinary symptoms   If you were to spend the rest of your life with your urinary condition just the way it is now how  would you feel about that? Unhappy        Score:  1-7 Mild 8-19 Moderate 20-35 Severe   Erectile dysfunction His SHIM score is 17, which is mild ED.   He has been having difficulty with erections for two months.   His major complaint is no erections.  His libido is preserved.   His risk factors for ED are age, BPH, HTN, depression and smoking.   He endorses painful erections.   The pain is located in the suprapubic region.  He is not having curvatures with his erections.   He is no longer having spontaneous erections.       SHIM    Row Name 12/02/16 1333         SHIM: Over the last 6 months:   How do you rate your confidence that you could get and keep an erection? Low     When you had erections with sexual stimulation, how often were your erections hard enough for penetration (entering your partner)? Sometimes (about half the time)     During sexual intercourse, how often were you able to maintain your erection after you had penetrated (entered) your partner? Sometimes (  about half the time)     During sexual intercourse, how difficult was it to maintain your erection to completion of intercourse? Slightly Difficult     When you attempted sexual intercourse, how often was it satisfactory for you? Almost Always or Always       SHIM Total Score   SHIM 17        Score: 1-7 Severe ED 8-11 Moderate ED 12-16 Mild-Moderate ED 17-21 Mild ED 22-25 No ED    Reviewed referral notes.    Worsening of symptoms since last urology visit.  Pt now w/ LUTS symptoms persistently throughout day and at night.  Has incomplete emptying, urgency, frequency, occasional incontinence, nocturia.  Plan: 1. Continue Flomax. 2. Referral to BUA to Dr. Apolinar Junes.  Pt requested male provider.  Has previously been seen by [facility], but desires care closer to his home in [place]. 3. Followup as needed.  Pt w/ painful erection and incomplete ability to obtain erection.  Is significant dissatisfier  to his wife for sexual intimacy.   Plan: 1. Referral to urology.  Encouraged ongoing management of BPH and depression as possible causes.  Last saw Dr. Achilles Dunk in 11/2015 He has continued to do very well with the use of the alpha-blocker. Ongoing use the medication is all that is indicated at this point. A follow-up has been scheduled in one year with repeat DRE and PSA as well as bladder scan. If he develops any significant worsening or change of voiding symptoms in the meantime, he is to contact us for further intervention. He was encouraged to maintain fluid intake. He is to notify us if there are any further problems or questions in the interim.  The primary encounter diagnosis was Nodular prostate with urinary obstruction. Diagnoses of Chronic prostatitis, Orchalgia, and Incomplete bladder emptying were also pertinent to this visit.     PMH: Past Medical History:  Diagnosis Date  . Anxiety   . Arthritis   . Depression   . Heartburn   . HLD (hyperlipidemia)   . Hypertension   . Kidney stone     Surgical History: History reviewed. No pertinent surgical history.  Home Medications:  Allergies as of 12/02/2016      Reactions   Shellfish Allergy Hives   Amlodipine Other (See Comments)   Possible hand swelling   Lisinopril Other (See Comments)   Cough      Medication List       Accurate as of 12/02/16  2:22 PM. Always use your most recent med list.          diclofenac 75 MG EC tablet Commonly known as:  VOLTAREN TAKE 1 TABLET BY MOUTH TWICE DAILY WITH FOOD AS NEEDED FOR PAIN   FLUoxetine 20 MG capsule Commonly known as:  PROZAC Take 1 capsule (20 mg total) by mouth daily.   hydrochlorothiazide 25 MG tablet Commonly known as:  HYDRODIURIL Take 25 mg by mouth daily.   omeprazole 20 MG capsule Commonly known as:  PRILOSEC TAKE 1 CAPSULE (20 MG) BY MOUTH ONCE DAILY   sildenafil 20 MG tablet Commonly known as:  REVATIO Take 3 to 5 tablets two hours before  intercouse on an empty stomach.  Do not take with nitrates.   tamsulosin 0.4 MG Caps capsule Commonly known as:  FLOMAX Take 2 capsules (0.8 mg total) by mouth daily.   triamcinolone 0.025 % ointment Commonly known as:  KENALOG Apply 1 application topically 2 (two) times daily.  Allergies:  Allergies  Allergen Reactions  . Shellfish Allergy Hives  . Amlodipine Other (See Comments)    Possible hand swelling  . Lisinopril Other (See Comments)    Cough    Family History: Family History  Problem Relation Age of Onset  . Heart disease Mother   . Colon cancer Mother   . Diabetes Father   . Prostate cancer Father   . Colon cancer Brother   . Heart disease Brother   . Kidney cancer Neg Hx   . Bladder Cancer Neg Hx     Social History:  reports that he has been smoking Cigarettes.  He has a 20.00 pack-year smoking history. He has never used smokeless tobacco. He reports that he drinks alcohol. He reports that he does not use drugs.  ROS: UROLOGY Frequent Urination?: Yes Hard to postpone urination?: Yes Burning/pain with urination?: No Get up at night to urinate?: Yes Leakage of urine?: Yes Urine stream starts and stops?: Yes Trouble starting stream?: Yes Do you have to strain to urinate?: Yes Blood in urine?: No Urinary tract infection?: No Sexually transmitted disease?: No Injury to kidneys or bladder?: No Painful intercourse?: Yes Weak stream?: Yes Erection problems?: Yes Penile pain?: Yes  Gastrointestinal Nausea?: No Vomiting?: No Indigestion/heartburn?: Yes Diarrhea?: No Constipation?: Yes  Constitutional Fever: No Night sweats?: No Weight loss?: No Fatigue?: Yes  Skin Skin rash/lesions?: Yes Itching?: Yes  Eyes Blurred vision?: Yes Double vision?: No  Ears/Nose/Throat Sore throat?: No Sinus problems?: Yes  Hematologic/Lymphatic Swollen glands?: No Easy bruising?: No  Cardiovascular Leg swelling?: No Chest pain?:  No  Respiratory Cough?: No Shortness of breath?: Yes  Endocrine Excessive thirst?: Yes  Musculoskeletal Back pain?: Yes Joint pain?: Yes  Neurological Headaches?: Yes Dizziness?: No  Psychologic Depression?: Yes Anxiety?: Yes  Physical Exam: BP 130/82   Pulse 80   Ht  (1.778 m)   Wt 210 lb 8 oz (95.5 kg)   BMI 30.20 kg/m   Constitutional: Well nourished. Alert and oriented, No acute distress. HEENT: [place] AT, moist mucus membranes. Trachea midline, no masses. Cardiovascular: No clubbing, cyanosis, or edema. Respiratory: Normal respiratory effort, no increased work of breathing. GI: Abdomen is soft, non tender, non distended, no abdominal masses. Liver and spleen not palpable.  No hernias appreciated.  Stool sample for occult testing is not indicated.   GU: No CVA tenderness.  No bladder fullness or masses.  Patient with circumcised phallus.   Urethral meatus is patent.  No penile discharge. No penile lesions or rashes. Scrotum without lesions, cysts, rashes and/or edema.  Testicles are located scrotally bilaterally. No masses are appreciated in the testicles. Left and right epididymis are normal. Rectal: Patient with  normal sphincter tone. Anus and perineum without scarring or rashes. No rectal masses are appreciated. Prostate is approximately 45 grams, no nodules are appreciated. Seminal vesicles are normal. Skin: No rashes, bruises or suspicious lesions. Lymph: No cervical or inguinal adenopathy. Neurologic: Grossly intact, no focal deficits, moving all 4 extremities. Psychiatric: Normal mood and affect.  Laboratory Data: PSA History  1.1 in 01/15  0.6 in 10/15 Lab Results  Component Value Date   WBC 8.1 08/12/2016   HGB 14.9 08/12/2016   HCT 43.6 08/12/2016   MCV 88.8 08/12/2016   PLT 282 08/12/2016    Lab Results  Component Value Date   CREATININE 1.04 08/12/2016    Lab Results  Component Value Date   AST 21 08/12/2016   Lab Results  Component  Value  Date   ALT 23 08/12/2016    I have reviewed the labs.   Pertinent Imaging: Results for JAHZIAH, SIMONIN (MRN [id number]) as of 12/02/2016 14:14  Ref. Range 12/02/2016 13:42  Scan Result Unknown 27   I have independently reviewed the films.    Assessment & Plan:    1. BPH with LUTS  - IPSS score is 17/5  - Continue conservative management, avoiding bladder irritants and timed voiding's  - most bothersome symptoms is/are urgency  - Continue tamsulosin 0.4 mg daily  - RTC pending PSA  2. Erectile dysfunction  - SHIM score is 17  - I explained to the patient that in order to achieve an erection it takes good functioning of the nervous system (parasympathetic and rs, sympathetic, sensory and motor), good blood flow into the erectile tissue of the penis and a desire to have sex  - I explained that conditions like diabetes, hypertension, coronary artery disease, peripheral vascular disease, smoking, alcohol consumption, age, sleep apnea and BPH can diminish the ability to have an erection  - we will obtain a serum testosterone level at this time; if it is abnormal we will need to repeat the study for confirmation  - A recent study published in Sex Med 2018 Apr 13 revealed moderate to vigorous aerobic exercise for 40 minutes 4 times per week can decrease erectile problems caused by physical inactivity, obesity, hypertension, metabolic syndrome and/or cardiovascular diseases  - We discussed trying a PDE5 inhibitor - he would like to try Sildenafil 20 mg, 3 to 5 tablets two hours prior to intercourse on an empty stomach, # 50; he is warned not to take medications that contain nitrates.  I also advised him of the side effects, such as: headache, flushing, dyspepsia, abnormal vision, nasal congestion, back pain, myalgia, nausea, dizziness, and rash.  - RTC pending testosterone level  3. Pelvic pain  - will reassess after blood work has returned  Return for pending PSA and testosterone  results.  These notes generated with voice recognition software. I apologize for typographical errors.  Michiel Cowboy, PA-C  [facility] [address], [address] [place], [place] [address] [phone]

## 2016-12-02 ENCOUNTER — Ambulatory Visit (INDEPENDENT_AMBULATORY_CARE_PROVIDER_SITE_OTHER): Payer: Self-pay | Admitting: Urology

## 2016-12-02 ENCOUNTER — Encounter: Payer: Self-pay | Admitting: Urology

## 2016-12-02 VITALS — BP 130/82 | HR 80 | Ht 70.0 in | Wt 210.5 lb

## 2016-12-02 DIAGNOSIS — R102 Pelvic and perineal pain: Secondary | ICD-10-CM

## 2016-12-02 DIAGNOSIS — N138 Other obstructive and reflux uropathy: Secondary | ICD-10-CM

## 2016-12-02 DIAGNOSIS — N529 Male erectile dysfunction, unspecified: Secondary | ICD-10-CM

## 2016-12-02 DIAGNOSIS — N401 Enlarged prostate with lower urinary tract symptoms: Secondary | ICD-10-CM

## 2016-12-02 LAB — BLADDER SCAN AMB NON-IMAGING: Scan Result: 27

## 2016-12-02 MED ORDER — SILDENAFIL CITRATE 20 MG PO TABS: ORAL_TABLET | ORAL | 3 refills | Status: DC

## 2016-12-03 ENCOUNTER — Telehealth: Payer: Self-pay

## 2016-12-03 DIAGNOSIS — E291 Testicular hypofunction: Secondary | ICD-10-CM

## 2016-12-03 LAB — TESTOSTERONE: TESTOSTERONE: 208 ng/dL — AB (ref 264–916)

## 2016-12-03 LAB — PSA: Prostate Specific Ag, Serum: 1.1 ng/mL (ref 0.0–4.0)

## 2016-12-03 NOTE — Telephone Encounter (Signed)
-----   Message from Harle Battiest, PA-C sent at 12/03/2016  7:38 AM EDT ----- Please let Mr. Dileonardo know that his PSA is normal, but his testosterone is low.  He will need two morning testosterone draws at least two days apart before 10 AM for confirmation.

## 2016-12-03 NOTE — Telephone Encounter (Signed)
Spoke with pt in reference to lab results. Made pt aware will need 2 more testosterones. Pt voiced understanding. Lab appt made and orders placed.

## 2016-12-04 ENCOUNTER — Other Ambulatory Visit: Payer: Self-pay

## 2016-12-04 DIAGNOSIS — E291 Testicular hypofunction: Secondary | ICD-10-CM

## 2016-12-05 ENCOUNTER — Other Ambulatory Visit: Payer: Self-pay

## 2016-12-05 LAB — TESTOSTERONE: Testosterone: 439 ng/dL (ref 264–916)

## 2016-12-11 ENCOUNTER — Telehealth: Payer: Self-pay

## 2016-12-11 NOTE — Telephone Encounter (Signed)
-----   Message from Harle BattiestShannon A McGowan, PA-C sent at 12/05/2016  7:45 AM EDT ----- Please let Mr. Sitar know that his testosterone is normal.

## 2016-12-11 NOTE — Telephone Encounter (Signed)
LMOM- labs are normal. 

## 2016-12-12 ENCOUNTER — Telehealth: Payer: Self-pay | Admitting: Urology

## 2016-12-12 NOTE — Telephone Encounter (Signed)
Please let Carlos Reynolds know that I would like to see him in one month for his ED and LU TS.

## 2016-12-13 NOTE — Telephone Encounter (Signed)
App made and mailed  Called patient and had to leave a message for him to call back  michelle

## 2016-12-19 ENCOUNTER — Ambulatory Visit: Payer: No Typology Code available for payment source | Admitting: Physical Therapy

## 2016-12-19 ENCOUNTER — Telehealth: Payer: Self-pay | Admitting: Nurse Practitioner

## 2016-12-19 NOTE — Telephone Encounter (Signed)
Pt called to check status of referral to pain clinic [phone]

## 2017-01-01 ENCOUNTER — Telehealth: Payer: Self-pay | Admitting: Nurse Practitioner

## 2017-01-01 NOTE — Telephone Encounter (Signed)
Pt called to check status of referral to pain management [phone]

## 2017-01-01 NOTE — Telephone Encounter (Signed)
The pt was notified that pain management have attempted to contact him several time. I gave him all the contact information so he could reach back out to them.

## 2017-01-14 NOTE — Progress Notes (Signed)
01/15/2017 11:40 AM   Carlos Reynolds 1956/02/14 [id number]  Referring provider: Galen Reynolds, Carlos Renee, NP [address] [place], [place] [id number]  Chief Complaint  Patient presents with  . Benign Prostatic Hypertrophy    HPI: Patient is a 61 year old African-American male with BPH and LUT S and erectile dysfunction and chronic pelvic pain who presents for a one month follow up with his wife, Carlos FurbishShante.    BPH WITH LUTS  (prostate and/or bladder) His IPSS score today is 16, which is moderate lower urinary tract symptomatology.  He is unhappy with his quality life due to his urinary symptoms.  His PVR is 55 mL.  His major complaints today are frequency, urgency, nocturia x 2-3, urge incontinence, intermittency and hesitancy.  He has had these symptoms for two months.  He denies any dysuria, hematuria or suprapubic pain.   He currently taking tamsulosin 0.4 mg.   He also denies any recent fevers, chills, nausea or vomiting.  Father was 61 yo when diagnosed with fatal prostate cancer.  His UA is negative.   IPSS    Row Name 12/02/16 1300 01/15/17 1000       International Prostate Symptom Score   How often have you had the sensation of not emptying your bladder?  About half the time  Less than half the time    How often have you had to urinate less than every two hours?  Less than half the time  About half the time    How often have you found you stopped and started again several times when you urinated?  Less than half the time  More than half the time    How often have you found it difficult to postpone urination?  Almost always  Not at All    How often have you had a weak urinary stream?  About half the time  About half the time    How often have you had to strain to start urination?  Not at All  Less than half the time    How many times did you typically get up at night to urinate?  2 Times  2 Times    Total IPSS Score  17  16      Quality of Life due to urinary symptoms   If you were to  spend the rest of your life with your urinary condition just the way it is now how would you feel about that?  Unhappy  Unhappy       Score:  1-7 Mild 8-19 Moderate 20-35 Severe   Erectile dysfunction His SHIM score is 12, which is mild to moderate ED.  His previous SHIM score was 17.  He has been having difficulty with erections for two months.   His major complaint is no erections.  His libido is preserved.   His risk factors for ED are age, BPH, HTN, depression and smoking.   He endorses painful erections.   The pain is located in the suprapubic region.  He is not having curvatures with his erections.   He is no longer having spontaneous erections.  His testosterone was found to be normal.  He was given a script for sildenafil one month ago.  He has not taken the medication as he states his testicles hurt too badly to have sexual intercourse.  His testosterone is normal.   SHIM    Row Name 12/02/16 1333 01/15/17 1001       SHIM: Over  the last 6 months:   How do you rate your confidence that you could get and keep an erection?  Low  Low    When you had erections with sexual stimulation, how often were your erections hard enough for penetration (entering your partner)?  Sometimes (about half the time)  Sometimes (about half the time)    During sexual intercourse, how often were you able to maintain your erection after you had penetrated (entered) your partner?  Sometimes (about half the time)  A Few Times (much less than half the time)    During sexual intercourse, how difficult was it to maintain your erection to completion of intercourse?  Slightly Difficult  Very Difficult    When you attempted sexual intercourse, how often was it satisfactory for you?  Almost Always or Always  Sometimes (about half the time)      SHIM Total Score   SHIM  17  12       Score: 1-7 Severe ED 8-11 Moderate ED 12-16 Mild-Moderate ED 17-21 Mild ED 22-25 No ED    Reviewed referral notes.     Worsening of symptoms since last urology visit.  Pt now w/ LUTS symptoms persistently throughout day and at night.  Has incomplete emptying, urgency, frequency, occasional incontinence, nocturia.  Plan: 1. Continue Flomax. 2. Referral to BUA to Dr. Apolinar Junes.  Pt requested male provider.  Has previously been seen by [facility], but desires care closer to his home in [place]. 3. Followup as needed.  Pt w/ painful erection and incomplete ability to obtain erection.  Is significant dissatisfier to his wife for sexual intimacy.   Plan: 1. Referral to urology.  Encouraged ongoing management of BPH and depression as possible causes.  Last saw Dr. Achilles Dunk in 11/2015 He has continued to do very well with the use of the alpha-blocker. Ongoing use the medication is all that is indicated at this point. A follow-up has been scheduled in one year with repeat DRE and PSA as well as bladder scan. If he develops any significant worsening or change of voiding symptoms in the meantime, he is to contact us for further intervention. He was encouraged to maintain fluid intake. He is to notify us if there are any further problems or questions in the interim.  The primary encounter diagnosis was Nodular prostate with urinary obstruction. Diagnoses of Chronic prostatitis, Orchalgia, and Incomplete bladder emptying were also pertinent to this visit.     PMH: Past Medical History:  Diagnosis Date  . Anxiety   . Arthritis   . Depression   . Heartburn   . HLD (hyperlipidemia)   . Hypertension   . Kidney stone     Surgical History: Past Surgical History:  Procedure Laterality Date  . NO PAST SURGERIES     verified with patient on 01/15/17    Home Medications:  Allergies as of 01/15/2017      Reactions   Shellfish Allergy Hives   Amlodipine Other (See Comments)   Possible hand swelling   Lisinopril Other (See Comments)   Cough      Medication List        Accurate as of 01/15/17 11:40 AM.  Always use your most recent med list.          diclofenac 75 MG EC tablet Commonly known as:  VOLTAREN TAKE 1 TABLET BY MOUTH TWICE DAILY WITH FOOD AS NEEDED FOR PAIN   FLUoxetine 20 MG capsule Commonly known as:  PROZAC Take 1  capsule (20 mg total) by mouth daily.   hydrochlorothiazide 25 MG tablet Commonly known as:  HYDRODIURIL Take 25 mg by mouth daily.   naproxen 250 MG tablet Commonly known as:  NAPROSYN Take 250 mg by mouth 2 (two) times daily with a meal.   omeprazole 20 MG capsule Commonly known as:  PRILOSEC TAKE 1 CAPSULE (20 MG) BY MOUTH ONCE DAILY   sildenafil 20 MG tablet Commonly known as:  REVATIO Take 3 to 5 tablets two hours before intercouse on an empty stomach.  Do not take with nitrates.   tamsulosin 0.4 MG Caps capsule Commonly known as:  FLOMAX Take 2 capsules (0.8 mg total) by mouth daily.       Allergies:  Allergies  Allergen Reactions  . Shellfish Allergy Hives  . Amlodipine Other (See Comments)    Possible hand swelling  . Lisinopril Other (See Comments)    Cough    Family History: Family History  Problem Relation Age of Onset  . Heart disease Mother   . Colon cancer Mother   . Diabetes Father   . Prostate cancer Father   . Colon cancer Brother   . Heart disease Brother   . Kidney cancer Neg Hx   . Bladder Cancer Neg Hx     Social History:  reports that he has been smoking cigarettes.  He has a 23.00 pack-year smoking history. he has never used smokeless tobacco. He reports that he drinks alcohol. He reports that he does not use drugs.  ROS: UROLOGY Frequent Urination?: Yes Hard to postpone urination?: Yes Burning/pain with urination?: No Get up at night to urinate?: Yes Leakage of urine?: No Urine stream starts and stops?: Yes Trouble starting stream?: Yes Do you have to strain to urinate?: No Blood in urine?: No Urinary tract infection?: No Sexually transmitted disease?: No Injury to kidneys or bladder?:  No Painful intercourse?: Yes Weak stream?: No Erection problems?: Yes Penile pain?: No  Gastrointestinal Nausea?: No Vomiting?: No Indigestion/heartburn?: Yes Diarrhea?: No Constipation?: No  Constitutional Fever: No Night sweats?: Yes Weight loss?: No Fatigue?: Yes  Skin Skin rash/lesions?: Yes Itching?: Yes  Eyes Blurred vision?: No Double vision?: No  Ears/Nose/Throat Sore throat?: No Sinus problems?: Yes  Hematologic/Lymphatic Swollen glands?: No Easy bruising?: No  Cardiovascular Leg swelling?: No Chest pain?: No  Respiratory Cough?: No Shortness of breath?: Yes  Endocrine Excessive thirst?: No  Musculoskeletal Back pain?: Yes Joint pain?: Yes  Neurological Headaches?: No Dizziness?: No  Psychologic Depression?: Yes Anxiety?: Yes  Physical Exam: BP 137/89   Pulse 60   Ht 5\' 10"  (1.778 m)   Wt 215 lb 11.2 oz (97.8 kg)   BMI 30.95 kg/m   Constitutional: Well nourished. Alert and oriented, No acute distress. HEENT: [place] AT, moist mucus membranes. Trachea midline, no masses. Cardiovascular: No clubbing, cyanosis, or edema. Respiratory: Normal respiratory effort, no increased work of breathing. Skin: No rashes, bruises or suspicious lesions. Lymph: No cervical or inguinal adenopathy. Neurologic: Grossly intact, no focal deficits, moving all 4 extremities. Psychiatric: Normal mood and affect.  Laboratory Data: PSA History  1.1 in 02/2013  0.6 in 11/2013  1.1 in 11/2016 Lab Results  Component Value Date   WBC 8.1 08/12/2016   HGB 14.9 08/12/2016   HCT 43.6 08/12/2016   MCV 88.8 08/12/2016   PLT 282 08/12/2016    Lab Results  Component Value Date   CREATININE 1.04 08/12/2016    Lab Results  Component Value Date   AST 21  08/12/2016   Lab Results  Component Value Date   ALT 23 08/12/2016    Urinalysis Negative.  See Epic.   Results for orders placed or performed in visit on 01/15/17  Bladder Scan (Post Void Residual)  in office  Result Value Ref Range   Scan Result 55 ml     I have reviewed the labs.  Assessment & Plan:    1. BPH with LUTS  - IPSS score is 16/5, slight improvement  - Continue conservative management, avoiding bladder irritants and timed voiding's  - most bothersome symptoms is/are urgency  - Continue tamsulosin 0.4 mg daily    2. Erectile dysfunction  - SHIM score is 12, it is worsening  - patient did not take the sildenafil due to pelvic pain  3. Pelvic pain  - will reassess after blood work has returned  - scrotal ultrasound is pending  Return for scrotal ultrasound reprort.  These notes generated with voice recognition software. I apologize for typographical errors.  Michiel Cowboy, PA-C  [facility] [address] [place], [place] [address] [phone]

## 2017-01-15 ENCOUNTER — Ambulatory Visit (INDEPENDENT_AMBULATORY_CARE_PROVIDER_SITE_OTHER): Payer: Self-pay | Admitting: Urology

## 2017-01-15 ENCOUNTER — Encounter: Payer: Self-pay | Admitting: Urology

## 2017-01-15 VITALS — BP 137/89 | HR 60 | Ht 70.0 in | Wt 215.7 lb

## 2017-01-15 DIAGNOSIS — R102 Pelvic and perineal pain: Secondary | ICD-10-CM

## 2017-01-15 DIAGNOSIS — N138 Other obstructive and reflux uropathy: Secondary | ICD-10-CM

## 2017-01-15 DIAGNOSIS — N529 Male erectile dysfunction, unspecified: Secondary | ICD-10-CM

## 2017-01-15 DIAGNOSIS — N401 Enlarged prostate with lower urinary tract symptoms: Secondary | ICD-10-CM

## 2017-01-15 DIAGNOSIS — N503 Cyst of epididymis: Secondary | ICD-10-CM

## 2017-01-15 DIAGNOSIS — N5082 Scrotal pain: Secondary | ICD-10-CM

## 2017-01-15 LAB — BLADDER SCAN AMB NON-IMAGING

## 2017-01-21 ENCOUNTER — Ambulatory Visit
Payer: Self-pay | Attending: Student in an Organized Health Care Education/Training Program | Admitting: Student in an Organized Health Care Education/Training Program

## 2017-01-21 ENCOUNTER — Ambulatory Visit
Admission: RE | Admit: 2017-01-21 | Discharge: 2017-01-21 | Disposition: A | Discharge status: Home / Self Care | Payer: No Typology Code available for payment source | Source: Ambulatory Visit | Attending: Urology | Admitting: Urology

## 2017-01-21 ENCOUNTER — Telehealth: Payer: Self-pay | Admitting: Urology

## 2017-01-21 ENCOUNTER — Encounter: Payer: Self-pay | Admitting: Student in an Organized Health Care Education/Training Program

## 2017-01-21 ENCOUNTER — Other Ambulatory Visit: Payer: Self-pay

## 2017-01-21 VITALS — BP 143/83 | HR 63 | Temp 97.8°F | Resp 16 | Ht 70.0 in | Wt 215.0 lb

## 2017-01-21 DIAGNOSIS — Z800 Family history of malignant neoplasm of digestive organs: Secondary | ICD-10-CM | POA: Insufficient documentation

## 2017-01-21 DIAGNOSIS — M25512 Pain in left shoulder: Secondary | ICD-10-CM | POA: Insufficient documentation

## 2017-01-21 DIAGNOSIS — Z888 Allergy status to other drugs, medicaments and biological substances status: Secondary | ICD-10-CM | POA: Insufficient documentation

## 2017-01-21 DIAGNOSIS — Z8042 Family history of malignant neoplasm of prostate: Secondary | ICD-10-CM | POA: Insufficient documentation

## 2017-01-21 DIAGNOSIS — Z8249 Family history of ischemic heart disease and other diseases of the circulatory system: Secondary | ICD-10-CM | POA: Insufficient documentation

## 2017-01-21 DIAGNOSIS — M19012 Primary osteoarthritis, left shoulder: Secondary | ICD-10-CM | POA: Insufficient documentation

## 2017-01-21 DIAGNOSIS — F419 Anxiety disorder, unspecified: Secondary | ICD-10-CM | POA: Insufficient documentation

## 2017-01-21 DIAGNOSIS — I10 Essential (primary) hypertension: Secondary | ICD-10-CM | POA: Insufficient documentation

## 2017-01-21 DIAGNOSIS — M1990 Unspecified osteoarthritis, unspecified site: Secondary | ICD-10-CM | POA: Insufficient documentation

## 2017-01-21 DIAGNOSIS — G894 Chronic pain syndrome: Secondary | ICD-10-CM | POA: Insufficient documentation

## 2017-01-21 DIAGNOSIS — Z79899 Other long term (current) drug therapy: Secondary | ICD-10-CM | POA: Insufficient documentation

## 2017-01-21 DIAGNOSIS — F329 Major depressive disorder, single episode, unspecified: Secondary | ICD-10-CM | POA: Insufficient documentation

## 2017-01-21 DIAGNOSIS — N5082 Scrotal pain: Secondary | ICD-10-CM | POA: Insufficient documentation

## 2017-01-21 DIAGNOSIS — Z91013 Allergy to seafood: Secondary | ICD-10-CM | POA: Insufficient documentation

## 2017-01-21 DIAGNOSIS — Z833 Family history of diabetes mellitus: Secondary | ICD-10-CM | POA: Insufficient documentation

## 2017-01-21 DIAGNOSIS — Z87442 Personal history of urinary calculi: Secondary | ICD-10-CM | POA: Insufficient documentation

## 2017-01-21 DIAGNOSIS — E785 Hyperlipidemia, unspecified: Secondary | ICD-10-CM | POA: Insufficient documentation

## 2017-01-21 DIAGNOSIS — N400 Enlarged prostate without lower urinary tract symptoms: Secondary | ICD-10-CM | POA: Insufficient documentation

## 2017-01-21 DIAGNOSIS — G8929 Other chronic pain: Secondary | ICD-10-CM

## 2017-01-21 DIAGNOSIS — Z87891 Personal history of nicotine dependence: Secondary | ICD-10-CM | POA: Insufficient documentation

## 2017-01-21 DIAGNOSIS — N529 Male erectile dysfunction, unspecified: Secondary | ICD-10-CM | POA: Insufficient documentation

## 2017-01-21 DIAGNOSIS — F32A Depression, unspecified: Secondary | ICD-10-CM

## 2017-01-21 DIAGNOSIS — I861 Scrotal varices: Secondary | ICD-10-CM | POA: Insufficient documentation

## 2017-01-21 DIAGNOSIS — N503 Cyst of epididymis: Secondary | ICD-10-CM | POA: Insufficient documentation

## 2017-01-21 MED ORDER — GABAPENTIN 300 MG PO CAPS: ORAL_CAPSULE | ORAL | 1 refills | Status: DC

## 2017-01-21 MED ORDER — DICLOFENAC SODIUM 75 MG PO TBEC: 75.0000 mg | DELAYED_RELEASE_TABLET | Freq: Two times a day (BID) | ORAL | 2 refills | Status: DC

## 2017-01-21 NOTE — Patient Instructions (Addendum)
-  Urine drug screen today. -Referral to pain psychology regarding risk of SUD -Start gabapentin 300 mg nightly times 2 weeks and increase to 300 mg twice daily. -Increase diclofenac to 75 mg twice daily.  Stop naproxen -We will consider left shoulder MRI in the future.  Patient wants to hold off at this time.  May need sedation for this-okay to write for Ativan or Valium prior to MRI.

## 2017-01-21 NOTE — Progress Notes (Signed)
Safety precautions to be maintained throughout the outpatient stay will include: orient to surroundings, keep bed in low position, maintain call bell within reach at all times, provide assistance with transfer out of bed and ambulation.  

## 2017-01-21 NOTE — Telephone Encounter (Signed)
Pt came in and left urine sample.  He stated he couldn't go when he was here for appt, but had been told to come back by to leave sample.

## 2017-01-21 NOTE — Progress Notes (Signed)
Patient's Name: Carlos Reynolds  MRN: [id number]  Referring Provider: Mikey College, *  DOB: 1955/04/16  PCP: Mikey College, NP  DOS: 01/21/2017  Note by: Gillis Santa, MD  Service setting: Ambulatory outpatient  Specialty: Interventional Pain Management  Location: [facility] (AMB) Pain Management Facility  Visit type: Initial Patient Evaluation  Patient type: New Patient   Primary Reason(s) for Visit: Encounter for initial evaluation of one or more chronic problems (new to examiner) potentially causing chronic pain, and posing a threat to normal musculoskeletal function. (Level of risk: High) CC: Shoulder Pain (right)  HPI  Carlos Reynolds is a 61 y.o. year old, male patient, who comes today to see [place] for the first time for an initial evaluation of his chronic pain. He has Osteoarthritis of left shoulder; Benign prostatic hyperplasia with incomplete bladder emptying; Erectile dysfunction; Current moderate episode of major depressive disorder without prior episode ([place]); Eczema; Depression; Chronic left shoulder pain; and Chronic pain syndrome on their problem list. Today he comes in for evaluation of his Shoulder Pain (right)  Pain Assessment: Location: Left Shoulder Radiating: left arm to elbow and joints in fingers Onset: More than a month ago Duration: Chronic pain Quality: Throbbing, Constant(grinding) Severity: 10-Worst pain ever/10 (self-reported pain score)  Note: Reported level is inconsistent with clinical observations. Clinically the patient looks like a 3/10 A 3/10 is viewed as "Moderate" and described as significantly interfering with activities of daily living (ADL). It becomes difficult to feed, bathe, get dressed, get on and off the toilet or to perform personal hygiene functions. Difficult to get in and out of bed or a chair without assistance. Very distracting. With effort, it can be ignored when deeply involved in activities.       When using our objective Pain Scale, levels  between 6 and 10/10 are said to belong in an emergency room, as it progressively worsens from a 6/10, described as severely limiting, requiring emergency care not usually available at an outpatient pain management facility. At a 6/10 level, communication becomes difficult and requires great effort. Assistance to reach the emergency department may be required. Facial flushing and profuse sweating along with potentially dangerous increases in heart rate and blood pressure will be evident. Effect on ADL: cant do anything.  Timing: Constant Modifying factors:  heating pad naproxin   Onset and Duration: Date of onset: 4 years Cause of pain: Unknown Severity: Getting worse, NAS-11 at its worse: 10/10, NAS-11 at its best: 8/10, NAS-11 now: 10/10 and NAS-11 on the average: 8/10 Timing: Night Aggravating Factors: Motion Alleviating Factors: Medications Associated Problems: Erectile dysfunction, Numbness and Pain that wakes patient up Quality of Pain: Aching Previous Examinations or Tests: X-rays Previous Treatments: Epidural steroid injections and Physical Therapy  The patient comes into the clinics today for the first time for a chronic pain management evaluation.   61 year old male who presents with left shoulder pain that is severe in nature secondary to left shoulder osteoarthritis, left shoulder rotator cuff tendinitis with acromioclavicular and humeral arthritis.  Patient has severely limited range of motion of his left shoulder with limited left shoulder abduction and internal rotation.  He states that his left shoulder pain impacts his sleeping, ability to perform activities of daily living.  This is resulted in sadness and depression because he feels guilty that he cannot help his wife with work and paying the bills.  Patient has been told that he may need a shoulder replacement surgery.  He has tried shoulder joint injections with  cortisone which were not effective.  Patient also tried physical  therapy which was not effective for his left shoulder.  In terms of pain medications, patient is currently on diclofenac 75 mg daily along with naproxen.  He takes Prozac 20 mg for his depression.  Today I took the time to provide the patient with information regarding my pain practice. The patient was informed that my practice is divided into two sections: an interventional pain management section, as well as a completely separate and distinct medication management section. I explained that I have procedure days for my interventional therapies, and evaluation days for follow-ups and medication management. Because of the amount of documentation required during both, they are kept separated. This means that there is the possibility that he may be scheduled for a procedure on one day, and medication management the next. I have also informed him that because of staffing and facility limitations, I no longer take patients for medication management only. To illustrate the reasons for this, I gave the patient the example of surgeons, and how inappropriate it would be to refer a patient to his/her care, just to write for the post-surgical antibiotics on a surgery done by a different surgeon.   Because interventional pain management is my board-certified specialty, the patient was informed that joining my practice means that they are open to any and all interventional therapies. I made it clear that this does not mean that they will be forced to have any procedures done. What this means is that I believe interventional therapies to be essential part of the diagnosis and proper management of chronic pain conditions. Therefore, patients not interested in these interventional alternatives will be better served under the care of a different practitioner.  The patient was also made aware of my Comprehensive Pain Management Safety Guidelines where by joining my practice, they limit all of their nerve blocks and joint  injections to those done by our practice, for as long as we are retained to manage their care.   Historic Controlled Substance Pharmacotherapy Review  PMP and historical list of controlled substances: Percocet 5 g, quantity 60, last filled 07/14/2015.  Not on any opioid therapy. Highest recorded MME/day: 30 mg/day Medications: Patient brought medications to be checked, as requested Pharmacodynamics: Desired effects: Analgesia: The patient reports 50% benefit. Reported improvement in function: The patient reports medication allows him to have a normal productive life, including accomplishing all ADLs. Clinically meaningful improvement in function (CMIF): Sustained CMIF goals met Perceived effectiveness: Described as relatively effective but with some room for improvement Undesirable effects: Side-effects or Adverse reactions: None reported Historical Monitoring: The patient  reports that he does not use drugs. List of all UDS Test(s): No results found for: MDMA, COCAINSCRNUR, [place], [place], CANNABQUANT, [place], [place] List of other Serum/Urine Drug Screening Test(s):  No results found for: AMPHSCRSER, BARBSCRSER, BENZOSCRSER, COCAINSCRSER, COCAINSCRNUR, PCPSCRSER, PCPQUANT, THCSCRSER, THCU, CANNABQUANT, OPIATESCRSER, OXYSCRSER, PROPOXSCRSER, ETH Historical Background Evaluation: [place] PMP: Six (6) year initial data search conducted.             PMP NARX Score Report:  Narcotic: 80 Sedative: 30 Stimulant: 0 [place] Department of public safety, offender search: Editor, commissioning Information) Non-contributory Risk Assessment Profile: Aberrant behavior: None observed or detected today Risk factors for fatal opioid overdose: None identified today PMP NARX Overdose Risk Score: 190 Fatal overdose hazard ratio (HR): Calculation deferred Non-fatal overdose hazard ratio (HR): Calculation deferred Risk of opioid abuse or dependence: 0.7-3.0% with doses ? 36 MME/day and 6.1-26% with  doses ? 120 MME/day. Substance  use disorder (SUD) risk level: Pending results of Medical Psychology Evaluation for SUD Opioid risk tool (ORT) (Total Score): 4 Opioid Risk Tool - 01/21/17 1313      Family History of Substance Abuse   Alcohol  Positive Male    Illegal Drugs  Negative    Rx Drugs  Negative      Personal History of Substance Abuse   Alcohol  Negative    Illegal Drugs  Negative    Rx Drugs  Negative      Age   Age between 54-45 years   No      History of Preadolescent Sexual Abuse   History of Preadolescent Sexual Abuse  Negative or Male      Psychological Disease   Psychological Disease  Negative    Depression  Positive      Total Score   Opioid Risk Tool Scoring  4    Opioid Risk Interpretation  Moderate Risk      ORT Scoring interpretation table:  Score <3 = Low Risk for SUD  Score between 4-7 = Moderate Risk for SUD  Score >8 = High Risk for Opioid Abuse   PHQ-2 Depression Scale:  Total score: 6  PHQ-2 Scoring interpretation table: (Score and probability of major depressive disorder)  Score 0 = No depression  Score 1 = 15.4% Probability  Score 2 = 21.1% Probability  Score 3 = 38.4% Probability  Score 4 = 45.5% Probability  Score 5 = 56.4% Probability  Score 6 = 78.6% Probability   PHQ-9 Depression Scale:  Total score: 24  PHQ-9 Scoring interpretation table:  Score 0-4 = No depression  Score 5-9 = Mild depression  Score 10-14 = Moderate depression  Score 15-19 = Moderately severe depression  Score 20-27 = Severe depression (2.4 times higher risk of SUD and 2.89 times higher risk of overuse)   Pharmacologic Plan: Pending ordered tests and/or consults            Initial impression: Pending review of available data and ordered tests.  Meds   Current Outpatient Medications:  .  diclofenac (VOLTAREN) 75 MG EC tablet, Take 1 tablet (75 mg total) by mouth 2 (two) times daily., Disp: 60 tablet, Rfl: 2 .  FLUoxetine (PROZAC) 20 MG capsule, Take 1 capsule (20 mg total) by mouth  daily., Disp: 30 capsule, Rfl: 3 .  hydrochlorothiazide (HYDRODIURIL) 25 MG tablet, Take 25 mg by mouth daily., Disp: , Rfl:  .  omeprazole (PRILOSEC) 20 MG capsule, TAKE 1 CAPSULE (20 MG) BY MOUTH ONCE DAILY, Disp: , Rfl:  .  tamsulosin (FLOMAX) 0.4 MG CAPS capsule, Take 2 capsules (0.8 mg total) by mouth daily., Disp: 60 capsule, Rfl: 5 .  gabapentin (NEURONTIN) 300 MG capsule, 300 mg qhs for 2 weeks then 300 mg BID, Disp: 60 capsule, Rfl: 1 .  sildenafil (REVATIO) 20 MG tablet, Take 3 to 5 tablets two hours before intercouse on an empty stomach.  Do not take with nitrates. (Patient not taking: Reported on 01/15/2017), Disp: 50 tablet, Rfl: 3  Imaging Review   Left shoulder x-ray: 10/14/2015 No acute fracture or dislocation is identified. Mild acromioclavicular and glenohumeral joint narrowing with marginal osteophytosis and additional mild irregularity of the left greater tuberosity is present, similar to prior examination. No evidence of joint effusion, soft tissue abnormality, or radiopaque foreign body. Visualized left hemithorax is unremarkable.  Complexity Note: Imaging results reviewed. Results shared with Carlos Reynolds, using Layman's terms.  ROS  Cardiovascular History: High blood pressure Pulmonary or Respiratory History: Shortness of breath Neurological History: No reported neurological signs or symptoms such as seizures, abnormal skin sensations, urinary and/or fecal incontinence, being born with an abnormal open spine and/or a tethered spinal cord Review of Past Neurological Studies: No results found for this or any previous visit. Psychological-Psychiatric History: Depressed Gastrointestinal History: No reported gastrointestinal signs or symptoms such as vomiting or evacuating blood, reflux, heartburn, alternating episodes of diarrhea and constipation, inflamed or scarred liver, or pancreas or irrregular and/or infrequent bowel movements Genitourinary History:  No reported renal or genitourinary signs or symptoms such as difficulty voiding or producing urine, peeing blood, non-functioning kidney, kidney stones, difficulty emptying the bladder, difficulty controlling the flow of urine, or chronic kidney disease Hematological History: No reported hematological signs or symptoms such as prolonged bleeding, low or poor functioning platelets, bruising or bleeding easily, hereditary bleeding problems, low energy levels due to low hemoglobin or being anemic Endocrine History: No reported endocrine signs or symptoms such as high or low blood sugar, rapid heart rate due to high thyroid levels, obesity or weight gain due to slow thyroid or thyroid disease Rheumatologic History: No reported rheumatological signs and symptoms such as fatigue, joint pain, tenderness, swelling, redness, heat, stiffness, decreased range of motion, with or without associated rash Musculoskeletal History: Negative for myasthenia gravis, muscular dystrophy, multiple sclerosis or malignant hyperthermia Work History: Disabled  Allergies  Carlos Reynolds is allergic to shellfish allergy; amlodipine; and lisinopril.  Laboratory Chemistry  Inflammation Markers (CRP: Acute Phase) (ESR: Chronic Phase) No results found for: CRP, ESRSEDRATE               Renal Function Markers Lab Results  Component Value Date   BUN 17 08/12/2016   CREATININE 1.04 08/12/2016   GFRAA >60 08/12/2016   GFRNONAA >60 08/12/2016                 Hepatic Function Markers Lab Results  Component Value Date   AST 21 08/12/2016   ALT 23 08/12/2016   ALBUMIN 3.8 08/12/2016   ALKPHOS 57 08/12/2016                 Electrolytes Lab Results  Component Value Date   NA 136 08/12/2016   K 3.7 08/12/2016   CL 103 08/12/2016   CALCIUM 8.9 08/12/2016                 Neuropathy Markers No results found for: VOHYWVPX10               Bone Pathology Markers Lab Results  Component Value Date   ALKPHOS 57 08/12/2016    CALCIUM 8.9 08/12/2016   TESTOSTERONE 439 12/04/2016                 Rheumatology Markers No results found for: LABURIC, URICUR              Coagulation Parameters Lab Results  Component Value Date   PLT 282 08/12/2016                 Cardiovascular Markers Lab Results  Component Value Date   HGB 14.9 08/12/2016   HCT 43.6 08/12/2016                 CA Markers No results found for: CEA, CA125, LABCA2               Note: Lab results reviewed.  PFSH  Drug: Mr.  Reynolds  reports that he does not use drugs. Alcohol:  reports that he drinks alcohol. Tobacco:  reports that he has been smoking cigarettes.  He has a 23.00 pack-year smoking history. he has never used smokeless tobacco. Medical:  has a past medical history of Anxiety, Arthritis, Depression, Heartburn, HLD (hyperlipidemia), Hypertension, and Kidney stone. Family: family history includes Colon cancer in his brother and mother; Diabetes in his father; Heart disease in his brother and mother; Prostate cancer in his father.  Past Surgical History:  Procedure Laterality Date  . NO PAST SURGERIES     verified with patient on 01/15/17   Active Ambulatory Problems    Diagnosis Date Noted  . 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 Benign prostatic hyperplasia with incomplete bladder emptying 11/21/2016  . Erectile dysfunction 11/21/2016  . Current moderate episode of major depressive disorder without prior episode ([place]) 11/21/2016  . Eczema 11/21/2016  . Depression 01/21/2017  . Chronic left shoulder pain 01/21/2017  . Chronic pain syndrome 01/21/2017   Resolved Ambulatory Problems    Diagnosis Date Noted  . No Resolved Ambulatory Problems   Past Medical History:  Diagnosis Date  . Anxiety   . Arthritis   . Depression   . Heartburn   . HLD (hyperlipidemia)   . Hypertension   . Kidney stone    Constitutional Exam  General appearance: Well nourished, well developed, and well hydrated. In no apparent acute  distress Vitals:   01/21/17 1305  BP: (!) 143/83  Pulse: 63  Resp: 16  Temp: 97.8 F (36.6 C)  TempSrc: Oral  SpO2: 100%  Weight: 215 lb (97.5 kg)  Height: '5\' 10"'$  (1.778 m)   BMI Assessment: Estimated body mass index is 30.85 kg/m as calculated from the following:   Height as of this encounter: '5\' 10"'$  (1.778 m).   Weight as of this encounter: 215 lb (97.5 kg).  BMI interpretation table: BMI level Category Range association with higher incidence of chronic pain  <18 kg/m2 Underweight   18.5-24.9 kg/m2 Ideal body weight   25-29.9 kg/m2 Overweight Increased incidence by 20%  30-34.9 kg/m2 Obese (Class I) Increased incidence by 68%  35-39.9 kg/m2 Severe obesity (Class II) Increased incidence by 136%  >40 kg/m2 Extreme obesity (Class III) Increased incidence by 254%   BMI Readings from Last 4 Encounters:  01/21/17 30.85 kg/m  01/15/17 30.95 kg/m  12/02/16 30.20 kg/m  11/18/16 30.10 kg/m   Wt Readings from Last 4 Encounters:  01/21/17 215 lb (97.5 kg)  01/15/17 215 lb 11.2 oz (97.8 kg)  12/02/16 210 lb 8 oz (95.5 kg)  11/18/16 209 lb 12.8 oz (95.2 kg)  Psych/Mental status: Alert, oriented x 3 (person, place, & time)       Eyes: PERLA Respiratory: No evidence of acute respiratory distress  Cervical Spine Area Exam  Skin & Axial Inspection: No masses, redness, edema, swelling, or associated skin lesions Alignment: Symmetrical Functional ROM: Unrestricted ROM      Stability: No instability detected Muscle Tone/Strength: Functionally intact. No obvious neuro-muscular anomalies detected. Sensory (Neurological): Unimpaired Palpation: No palpable anomalies              Upper Extremity (UE) Exam    Side: Right upper extremity  Side: Left upper extremity  Skin & Extremity Inspection: Skin color, temperature, and hair growth are WNL. No peripheral edema or cyanosis. No masses, redness, swelling, asymmetry, or associated skin lesions. No contractures.  Skin & Extremity  Inspection: Degenerative deforming arthropathy  Functional  ROM: Unrestricted ROM          Functional ROM: Decreased ROM limited shoulder abduction.  Unable to extend beyond 90 degrees          Muscle Tone/Strength: Functionally intact. No obvious neuro-muscular anomalies detected.  Muscle Tone/Strength: Functionally intact. No obvious neuro-muscular anomalies detected.  Sensory (Neurological): Unimpaired          Sensory (Neurological): Arthropathic arthralgia          Palpation: No palpable anomalies              Palpation: Complains of area being tender to palpation              Specialized Test(s): Deferred         Specialized Test(s): Deferred          Thoracic Spine Area Exam  Skin & Axial Inspection: No masses, redness, or swelling Alignment: Symmetrical Functional ROM: Unrestricted ROM Stability: No instability detected Muscle Tone/Strength: Functionally intact. No obvious neuro-muscular anomalies detected. Sensory (Neurological): Unimpaired Muscle strength & Tone: No palpable anomalies  Lumbar Spine Area Exam  Skin & Axial Inspection: No masses, redness, or swelling Alignment: Symmetrical Functional ROM: Unrestricted ROM      Stability: No instability detected Muscle Tone/Strength: Functionally intact. No obvious neuro-muscular anomalies detected. Sensory (Neurological): Unimpaired Palpation: No palpable anomalies       Provocative Tests: Lumbar Hyperextension and rotation test: evaluation deferred today       Lumbar Lateral bending test: evaluation deferred today       Patrick's Maneuver: evaluation deferred today                    Gait & Posture Assessment  Ambulation: Unassisted Gait: Relatively normal for age and body habitus Posture: WNL   Lower Extremity Exam    Side: Right lower extremity  Side: Left lower extremity  Skin & Extremity Inspection: Skin color, temperature, and hair growth are WNL. No peripheral edema or cyanosis. No masses, redness, swelling,  asymmetry, or associated skin lesions. No contractures.  Skin & Extremity Inspection: Skin color, temperature, and hair growth are WNL. No peripheral edema or cyanosis. No masses, redness, swelling, asymmetry, or associated skin lesions. No contractures.  Functional ROM: Unrestricted ROM          Functional ROM: Unrestricted ROM          Muscle Tone/Strength: Functionally intact. No obvious neuro-muscular anomalies detected.  Muscle Tone/Strength: Functionally intact. No obvious neuro-muscular anomalies detected.  Sensory (Neurological): Unimpaired  Sensory (Neurological): Unimpaired  Palpation: No palpable anomalies  Palpation: No palpable anomalies   Assessment  Primary Diagnosis & Pertinent Problem List: The primary encounter diagnosis was Primary osteoarthritis of left shoulder. Diagnoses of Chronic left shoulder pain, Depression, unspecified depression type, and Chronic pain syndrome were also pertinent to this visit.  Visit Diagnosis (New problems to examiner): 1. Primary osteoarthritis of left shoulder   2. Chronic left shoulder pain   3. Depression, unspecified depression type   4. Chronic pain syndrome    Plan of Care (Initial workup plan)  Note: Please be advised that as per protocol, today's visit has been an evaluation only. We have not taken over the patient's controlled substance management.  61 year old male with a history of left shoulder pain secondary to left shoulder osteoarthritis and left rotator cuff dysfunction who is tried physical therapy as well as left shoulder steroid injections which were not helpful for his shoulder pain.  Patient notes significant pain in  his shoulder that limits his ability to perform activities of daily living has also resulted in depression.  Patient has not obtained an MRI of his left shoulder because he states he is claustrophobic and wanted to avoid an MRI.  Patient has been told that he may need left shoulder surgery however wants to hold off  and explore nonoperative therapeutic options.  Because left shoulder joint injections have been unhelpful, we will not plan on repeating.  Treatment plan were primarily consist of medication management since patient has tried interventional therapies along with physical therapy and they have not been very effective.  For depression, patient is on Prozac.  Plan: -Urine drug screen today. -Referral to pain psychology regarding risk of SUD -Start gabapentin 300 mg nightly times 2 weeks and increase to 300 mg twice daily. -Increase diclofenac to 75 mg twice daily.  Stop naproxen -We will consider left shoulder MRI in the future.  Patient wants to hold off at this time.  May need sedation for this-okay to write for Ativan or Valium prior to MRI.   Ordered Lab-work, Procedure(s), Referral(s), & Consult(s): Orders Placed This Encounter  Procedures  . Compliance Drug Analysis, Ur  . Ambulatory referral to Psychology   Pharmacotherapy (current): Medications ordered:  Meds ordered this encounter  Medications  . DISCONTD: gabapentin (NEURONTIN) 300 MG capsule    Sig: 300 mg qhs for 2 weeks then 300 mg BID    Dispense:  60 capsule    Refill:  1  . DISCONTD: diclofenac (VOLTAREN) 75 MG EC tablet    Sig: Take 1 tablet (75 mg total) by mouth 2 (two) times daily.    Dispense:  60 tablet    Refill:  2  . diclofenac (VOLTAREN) 75 MG EC tablet    Sig: Take 1 tablet (75 mg total) by mouth 2 (two) times daily.    Dispense:  60 tablet    Refill:  2  . gabapentin (NEURONTIN) 300 MG capsule    Sig: 300 mg qhs for 2 weeks then 300 mg BID    Dispense:  60 capsule    Refill:  1   Medications administered during this visit: Carlos Reynolds had no medications administered during this visit.   Pharmacological management options:  Opioid Analgesics: The patient was informed that there is no guarantee that he would be a candidate for opioid analgesics. The decision will be made following CDC guidelines. This  decision will be based on the results of diagnostic studies, as well as Carlos Reynolds's risk profile.   Membrane stabilizer: To be determined at a later time  Muscle relaxant: To be determined at a later time  NSAID: To be determined at a later time  Other analgesic(s): To be determined at a later time   Provider-requested follow-up: Return in about 4 weeks (around 02/18/2017) for Medication Management.  Future Appointments  Date Time Provider [place]  01/21/2017  4:00 PM [facility]  01/23/2017  3:15 PM McGowan, Larene Beach A, PA-C [facility] None  01/27/2017  1:00 PM Aurea Graff, Shin-Yiing, PT [facility] None  02/10/2017  1:00 PM Aurea Graff, Shin-Yiing, PT [facility] None  02/13/2017  1:30 PM Gillis Santa, MD [facility] None  03/03/2017  1:00 PM Jerl Mina, PT [facility] None  03/10/2017 11:00 AM Jerl Mina, PT [facility] None  03/24/2017  1:00 PM Jerl Mina, PT [facility] None  04/07/2017  1:00 PM Jerl Mina, PT [facility] None  04/21/2017 11:00 AM Jerl Mina, PT [facility] None    Primary  Care Physician: Mikey College, NP Location: [facility] Note by: Gillis Santa, M.D, Date: 01/21/2017; Time: 2:19 PM  Patient Instructions  1.

## 2017-01-22 LAB — URINALYSIS, COMPLETE
Bilirubin, UA: NEGATIVE
GLUCOSE, UA: NEGATIVE
LEUKOCYTES UA: NEGATIVE
Nitrite, UA: NEGATIVE
PH UA: 6 (ref 5.0–7.5)
RBC, UA: NEGATIVE
Specific Gravity, UA: 1.02 (ref 1.005–1.030)
Urobilinogen, Ur: 1 mg/dL (ref 0.2–1.0)

## 2017-01-22 LAB — MICROSCOPIC EXAMINATION
BACTERIA UA: NONE SEEN
Epithelial Cells (non renal): NONE SEEN /hpf (ref 0–10)
WBC, UA: NONE SEEN /hpf (ref 0–?)

## 2017-01-22 NOTE — Progress Notes (Deleted)
01/23/2017 10:21 PM   Carlos Reynolds 06-Oct-1955 [id number]  Referring provider: Galen Manila, NP [address] [place], [place] [address]  No chief complaint on file.   HPI: Patient is a 61 year old African-American male with BPH and LUT S and erectile dysfunction and chronic pelvic pain who presents for a one month follow up with his wife, [name].    BPH WITH LUTS  (prostate and/or bladder) His IPSS score today is 16, which is moderate lower urinary tract symptomatology.  He is unhappy with his quality life due to his urinary symptoms.  His PVR is 55 mL.  His major complaints today are frequency, urgency, nocturia x 2-3, urge incontinence, intermittency and hesitancy.  He has had these symptoms for two months.  He denies any dysuria, hematuria or suprapubic pain.   He currently taking tamsulosin 0.4 mg.   He also denies any recent fevers, chills, nausea or vomiting.  Father was 73 yo when diagnosed with fatal prostate cancer.  His UA is negative.   IPSS    Row Name 12/02/16 1300 01/15/17 1000       International Prostate Symptom Score   How often have you had the sensation of not emptying your bladder?  About half the time  Less than half the time    How often have you had to urinate less than every two hours?  Less than half the time  About half the time    How often have you found you stopped and started again several times when you urinated?  Less than half the time  More than half the time    How often have you found it difficult to postpone urination?  Almost always  Not at All    How often have you had a weak urinary stream?  About half the time  About half the time    How often have you had to strain to start urination?  Not at All  Less than half the time    How many times did you typically get up at night to urinate?  2 Times  2 Times    Total IPSS Score  17  16      Quality of Life due to urinary symptoms   If you were to spend the rest of your life with your urinary  condition just the way it is now how would you feel about that?  Unhappy  Unhappy       Score:  1-7 Mild 8-19 Moderate 20-35 Severe   Erectile dysfunction His SHIM score is 12, which is mild to moderate ED.  His previous SHIM score was 17.  He has been having difficulty with erections for two months.   His major complaint is no erections.  His libido is preserved.   His risk factors for ED are age, BPH, HTN, depression and smoking.   He endorses painful erections.   The pain is located in the suprapubic region.  He is not having curvatures with his erections.   He is no longer having spontaneous erections.  His testosterone was found to be normal.  He was given a script for sildenafil one month ago.  He has not taken the medication as he states his testicles hurt too badly to have sexual intercourse.  His testosterone is normal.   SHIM    Row Name 12/02/16 1333 01/15/17 1001       SHIM: Over the last 6 months:   How  do you rate your confidence that you could get and keep an erection?  Low  Low    When you had erections with sexual stimulation, how often were your erections hard enough for penetration (entering your partner)?  Sometimes (about half the time)  Sometimes (about half the time)    During sexual intercourse, how often were you able to maintain your erection after you had penetrated (entered) your partner?  Sometimes (about half the time)  A Few Times (much less than half the time)    During sexual intercourse, how difficult was it to maintain your erection to completion of intercourse?  Slightly Difficult  Very Difficult    When you attempted sexual intercourse, how often was it satisfactory for you?  Almost Always or Always  Sometimes (about half the time)      SHIM Total Score   SHIM  17  12       Score: 1-7 Severe ED 8-11 Moderate ED 12-16 Mild-Moderate ED 17-21 Mild ED 22-25 No ED    Reviewed referral notes.    Worsening of symptoms since last urology visit.  Pt  now w/ LUTS symptoms persistently throughout day and at night.  Has incomplete emptying, urgency, frequency, occasional incontinence, nocturia.  Plan: 1. Continue Flomax. 2. Referral to [facility] to Dr. Apolinar JunesBrandon.  Pt requested male provider.  [facility], but desires care closer to his home in [place]. 3. Followup as needed.  Pt w/ painful erection and incomplete ability to obtain erection.  Is significant dissatisfier to his wife for sexual intimacy.   Plan: 1. Referral to urology.  Encouraged ongoing management of BPH and depression as possible causes.  Last saw Dr. Achilles Dunkope in 11/2015 He has continued to do very well with the use of the alpha-blocker. Ongoing use the medication is all that is indicated at this point. A follow-up has been scheduled in one year with repeat DRE and PSA as well as bladder scan. If he develops any significant worsening or change of voiding symptoms in the meantime, he is to contact us for further intervention. He was encouraged to maintain fluid intake. He is to notify us if there are any further problems or questions in the interim.  The primary encounter diagnosis was Nodular prostate with urinary obstruction. Diagnoses of Chronic prostatitis, Orchalgia, and Incomplete bladder emptying were also pertinent to this visit.     PMH: Past Medical History:  Diagnosis Date  . Anxiety   . Arthritis   . Depression   . Heartburn   . HLD (hyperlipidemia)   . Hypertension   . Kidney stone     Surgical History: Past Surgical History:  Procedure Laterality Date  . NO PAST SURGERIES     verified with patient on 01/15/17    Home Medications:  Allergies as of 01/23/2017      Reactions   Shellfish Allergy Hives   Amlodipine Other (See Comments)   Possible hand swelling   Lisinopril Other (See Comments)   Cough      Medication List        Accurate as of 01/22/17 10:21 PM. Always use your most recent med list.            diclofenac 75 MG EC tablet Commonly known as:  VOLTAREN Take 1 tablet (75 mg total) by mouth 2 (two) times daily.   FLUoxetine 20 MG capsule Commonly known as:  PROZAC Take 1 capsule (20 mg total) by mouth daily.  gabapentin 300 MG capsule Commonly known as:  NEURONTIN 300 mg qhs for 2 weeks then 300 mg BID   hydrochlorothiazide 25 MG tablet Commonly known as:  HYDRODIURIL Take 25 mg by mouth daily.   omeprazole 20 MG capsule Commonly known as:  PRILOSEC TAKE 1 CAPSULE (20 MG) BY MOUTH ONCE DAILY   sildenafil 20 MG tablet Commonly known as:  REVATIO Take 3 to 5 tablets two hours before intercouse on an empty stomach.  Do not take with nitrates.   tamsulosin 0.4 MG Caps capsule Commonly known as:  FLOMAX Take 2 capsules (0.8 mg total) by mouth daily.       Allergies:  Allergies  Allergen Reactions  . Shellfish Allergy Hives  . Amlodipine Other (See Comments)    Possible hand swelling  . Lisinopril Other (See Comments)    Cough    Family History: Family History  Problem Relation Age of Onset  . Heart disease Mother   . Colon cancer Mother   . Diabetes Father   . Prostate cancer Father   . Colon cancer Brother   . Heart disease Brother   . Kidney cancer Neg Hx   . Bladder Cancer Neg Hx     Social History:  reports that he has been smoking cigarettes.  He has a 23.00 pack-year smoking history. he has never used smokeless tobacco. He reports that he drinks alcohol. He reports that he does not use drugs.  ROS:                                        Physical Exam: There were no vitals taken for this visit.  Constitutional: Well nourished. Alert and oriented, No acute distress. HEENT: [place] AT, moist mucus membranes. Trachea midline, no masses. Cardiovascular: No clubbing, cyanosis, or edema. Respiratory: Normal respiratory effort, no increased work of breathing. Skin: No rashes, bruises or suspicious lesions. Lymph: No cervical or  inguinal adenopathy. Neurologic: Grossly intact, no focal deficits, moving all 4 extremities. Psychiatric: Normal mood and affect.  Laboratory Data: PSA History  1.1 in 02/2013  0.6 in 11/2013  1.1 in 11/2016 Lab Results  Component Value Date   WBC 8.1 08/12/2016   HGB 14.9 08/12/2016   HCT 43.6 08/12/2016   MCV 88.8 08/12/2016   PLT 282 08/12/2016    Lab Results  Component Value Date   CREATININE 1.04 08/12/2016    Lab Results  Component Value Date   AST 21 08/12/2016   Lab Results  Component Value Date   ALT 23 08/12/2016    Urinalysis Negative.  See Epic.   Results for orders placed or performed in visit on 01/15/17  Microscopic Examination  Result Value Ref Range   WBC, UA None seen 0 - 5 /hpf   RBC, UA 0-2 0 - 2 /hpf   Epithelial Cells (non renal) None seen 0 - 10 /hpf   Casts Present (A) None seen /lpf   Cast Type Hyaline casts N/A   Mucus, UA Present (A) Not Estab.   Bacteria, UA None seen None seen/Few  Urinalysis, Complete  Result Value Ref Range   Specific Gravity, UA 1.020 1.005 - 1.030   pH, UA 6.0 5.0 - 7.5   Color, UA Yellow Yellow   Appearance Ur Clear Clear   Leukocytes, UA Negative Negative   Protein, UA Trace (A) Negative/Trace   Glucose, UA Negative Negative  Ketones, UA Trace (A) Negative   RBC, UA Negative Negative   Bilirubin, UA Negative Negative   Urobilinogen, Ur 1.0 0.2 - 1.0 mg/dL   Nitrite, UA Negative Negative   Microscopic Examination See below:   Bladder Scan (Post Void Residual) in office  Result Value Ref Range   Scan Result 55 ml     I have reviewed the labs.  Assessment & Plan:    1. BPH with LUTS  - IPSS score is 16/5, slight improvement  - Continue conservative management, avoiding bladder irritants and timed voiding's  - most bothersome symptoms is/are urgency  - Continue tamsulosin 0.4 mg daily    2. Erectile dysfunction  - SHIM score is 12, it is worsening  - patient did not take the sildenafil due  to pelvic pain  3. Pelvic pain  - will reassess after blood work has returned  - scrotal ultrasound is pending  No Follow-up on file.  These notes generated with voice recognition software. I apologize for typographical errors.  Michiel Cowboy, PA-C  [facility] [address], [address] [place], [place] [address] [phone]

## 2017-01-23 ENCOUNTER — Ambulatory Visit: Payer: Self-pay | Admitting: Urology

## 2017-01-23 ENCOUNTER — Encounter: Payer: Self-pay | Admitting: Urology

## 2017-01-26 LAB — COMPLIANCE DRUG ANALYSIS, UR

## 2017-01-27 ENCOUNTER — Ambulatory Visit: Payer: No Typology Code available for payment source | Admitting: Physical Therapy

## 2017-01-27 ENCOUNTER — Ambulatory Visit (INDEPENDENT_AMBULATORY_CARE_PROVIDER_SITE_OTHER): Payer: Self-pay | Admitting: Urology

## 2017-01-27 ENCOUNTER — Encounter: Payer: Self-pay | Admitting: Urology

## 2017-01-27 ENCOUNTER — Other Ambulatory Visit: Payer: Self-pay

## 2017-01-27 ENCOUNTER — Ambulatory Visit: Payer: No Typology Code available for payment source | Attending: Urology | Admitting: Physical Therapy

## 2017-01-27 VITALS — BP 124/86 | HR 85 | Ht 70.0 in | Wt 219.0 lb

## 2017-01-27 DIAGNOSIS — M533 Sacrococcygeal disorders, not elsewhere classified: Secondary | ICD-10-CM | POA: Insufficient documentation

## 2017-01-27 DIAGNOSIS — M2170 Unequal limb length (acquired), unspecified site: Secondary | ICD-10-CM | POA: Insufficient documentation

## 2017-01-27 DIAGNOSIS — M791 Myalgia, unspecified site: Secondary | ICD-10-CM | POA: Insufficient documentation

## 2017-01-27 DIAGNOSIS — R102 Pelvic and perineal pain: Secondary | ICD-10-CM

## 2017-01-27 DIAGNOSIS — G8929 Other chronic pain: Secondary | ICD-10-CM | POA: Insufficient documentation

## 2017-01-27 DIAGNOSIS — M25512 Pain in left shoulder: Secondary | ICD-10-CM | POA: Insufficient documentation

## 2017-01-27 NOTE — Therapy (Signed)
[facility] [address] [place], [place], [address] Phone: [phone]   Fax:  [phone]  Physical Therapy Evaluation  Patient Details  Name: Carlos BustardWilbert Hakala MRN: [id number] Date of Birth: 1955/10/24 No Data Recorded  Encounter Date: 01/27/2017    Past Medical History:  Diagnosis Date  . Anxiety   . Arthritis   . Depression   . Heartburn   . HLD (hyperlipidemia)   . Hypertension   . Kidney stone     Past Surgical History:  Procedure Laterality Date  . NO PAST SURGERIES     verified with patient on 01/15/17    There were no vitals filed for this visit.   Subjective Assessment - 01/27/17 1314    Subjective  1) Scrotum pain: Pt reports this pain has been going on for 4 years. It came on gradually and got worsen as time passed.  L scrotum is more painful than R.  Pt notices there is excess tissue on teh side of the L testicle. Tests have been done. Dr. Achilles Dunkope explained his pain is due to pt's prostate being swollen. US was done and he is waiting on the results. The pain is a constant ache. 10/10.  Pain interferes with sexual function, but not urination, nor bowel movements.  The pain also occurs when his testicules are not in a certain position. Pt's wife brought him some briefs which has helped him instead of boxers. Pain is not has painful at night because pain is more when he is active.  Occuptaion: unemployed since April 2018. 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 Pt's tasks involved: repeated heavy lifting 50lbs every other day, pounding a wedge, driving a forklift. Bowel movements: 2x day. Pt notices his bowels are becoming harder. Pt had to take a laxative. Denied a fall onto his tailbone.      2) L shoulder pain started last 7 years. Pt has arthritis in it and he hears his elbow grinding when he moves his hand. In the morning, he also hears it grinding which will go away after he uses it and  moves around. 10 /10.  Pt is left handed. Pt is not able to hold his grandchildren nor play basketball. Denied radiating pain. The pain makes him move alot at night which has led his wife to request that he sleeps on the sofa   Pertinent History  Pt recently signed up for disability. Pt would love to go back to work. Pt would like to return to working with fiberglass.     Patient Stated Goals  comfort , relief          [facility] PT Assessment - 01/27/17 1348      Functional Tests   Functional tests  -- reach behind back Ldigit L1, R T12       Single Leg Stance   Comments  decreased balance B       AROM   Overall AROM Comments  HIP FADDIR B, hip IR B limited ( increased post Tx)       Palpation   SI assessment   L ASIS/medial malleoli  more superior, ( levelled post Tx)  L 93 cm, R 94 cm malleoli, ASIS      Palpation comment  Pain B proximal attachment of ischiocavernosus ( decreased post Tx)               Objective measurements completed on examination: See above  findings.      [facility] Adult PT Treatment/Exercise - 01/27/17 1517      Exercises   Exercises  -- see pt instructions, shoe lift fitting on L foot       Manual Therapy   Manual therapy comments  long axis distraction B, AP mob hip flex, FADDIR / ER grade III  mob AP              PT Education - 01/27/17 1519    Education provided  Yes    Education Details  POC, anatomy/ physiology, goals, HEP     Person(s) Educated  Patient    Methods  Explanation;Demonstration;Tactile cues;Verbal cues;Handout    Comprehension  Returned demonstration;Verbalized understanding          PT Long Term Goals - 01/27/17 1405      PT LONG TERM GOAL #1   Title  Pt will report decreased scrotum pain from 10/10 to < 5/10 in order to improve QOL     Time  4    Period  Weeks    Status  New    Target Date  02/24/17      PT LONG TERM GOAL #2   Title  Pt will return to regular bowel movements 2x/ day instead of needing a  laxative in order to restore pelvic floor function    Time  10    Period  Weeks    Status  New    Target Date  04/07/17      PT LONG TERM GOAL #3   Title  Pt will report no tenderness/ and demo no tensions at ischiocavernosus B across 2 visits in order to progress to deep core exercises     Time  2    Period  Weeks    Status  New    Target Date  02/10/17      PT LONG TERM GOAL #4   Title  Pt will decrease QUICK DASH score  from % to < % in order to restore L shoulder function to sleep in the same bed as his wife, pull doors, open jars, and lift grandchildren      Time  12    Period  Weeks    Status  New    Target Date  04/21/17      PT LONG TERM GOAL #5   Title  Pt will decrease his PDI score from % to % in order to improve QOL related to scrotal pain    Time  12    Period  Weeks    Status  New    Target Date  04/21/17      Additional Long Term Goals   Additional Long Term Goals  Yes      PT LONG TERM GOAL #6   Title  Pt will demo proper lifting mechanics with 40 lbs without shoulder/ pelvic pain in order to lift grandchildren     Time  12    Period  Weeks    Status  New    Target Date  04/21/17             Plan - 01/27/17 1450    Clinical Impression Statement  Pt is a 61 yr old male who complains of chronic scrotal pain L > R and L shoulder pain which impacts his ADLs and QOL. Pt's clinical presentations include pelvic obliquties, leg length difference( L shorter than R), limited hip/ SIJ mobility,  pelvic floor tenderness/tightness at ischiocavernosus  B, and limited shoulder ROM.  Pt showed improved SIJ/ hip mobility and less pelvic floor tightenss/ tenderness post Tx. Pt was fitted a shoe lift in L shoe to account for leg length difference. Pt reported his scrotal pain felt better following Tx, decreased by 15-20%. Plan to assess shoulder at upcoming sessions. Pt was provided education on the impact of cigarettes/ alcohol on cardiovascular/ sexual function.  Pt was  recommended to increase water intake gradually because pt reported he does not drink any water and does not like the taste.   Pt voiced understanding.   History and Personal Factors relevant to plan of care:  arthritis, heavy lifting at previous job, smoker, consuming alcohol 5 cans/ day ( pt reports for pain management. PT provided education to decrease consumption for overall health and sexual health. PT provided motivional interviewing and encourgement on pain management).      Clinical Presentation  Evolving    Clinical Decision Making  Moderate    Rehab Potential  Good    PT Frequency  2x / week    PT Duration  12 weeks    PT Treatment/Interventions  Manual lymph drainage;Neuromuscular re-education;Moist Heat;Therapeutic activities;Functional mobility training;Stair training;Gait training;Patient/family education;Passive range of motion;Manual techniques;Therapeutic exercise    Consulted and Agree with Plan of Care  Patient       Patient will benefit from skilled therapeutic intervention in order to improve the following deficits and impairments:  Decreased activity tolerance, Decreased mobility, Difficulty walking, Increased muscle spasms, Decreased strength, Pain, Decreased range of motion, Decreased safety awareness, Decreased coordination, Postural dysfunction, Improper body mechanics, Decreased endurance, Hypomobility  Visit Diagnosis: Sacrococcygeal disorders, not elsewhere classified  Leg length discrepancy  Chronic left shoulder pain  Myalgia     Problem List Patient Active Problem List   Diagnosis Date Noted  . Depression 01/21/2017  . Chronic left shoulder pain 01/21/2017  . Chronic pain syndrome 01/21/2017  . 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 Benign prostatic hyperplasia with incomplete bladder emptying 11/21/2016  . Erectile dysfunction 11/21/2016  . Current moderate episode of major depressive disorder without prior episode (HCC) 11/21/2016  . Eczema  11/21/2016    Mariane Masters ,PT, DPT, E-RYT  01/27/2017, 3:45 PM   [facility] MAIN [facility] SERVICES [address] [place], [place], [address] Phone: [phone]   Fax:  [phone]  Name: Andrik Sandt MRN: [id number] Date of Birth: 07/17/1955

## 2017-01-27 NOTE — Patient Instructions (Signed)
    Decrease alcohol, cigarettes for less inflammable and for better blood circulation for aerobic and sexual health  Water increase 24 fl oz this week, gradually increase to 48 fl oz    _____    Standing:  10 reps on both sides x 3 x day     3 point tap   Feet are hip width Tap forward, center under hip not feet next to each other  Tap middle\, center  Tap back       _Figure-4 and then toe touch to the ground behind you along a diagonal    ________   Wear the shoe lift on L shoe

## 2017-01-27 NOTE — Progress Notes (Signed)
01/27/2017 3:29 PM   Carlos Reynolds 23-Jun-1955 [id number]  Referring provider: Galen Manila, NP [address] [place], [place] [address]  Chief Complaint  Patient presents with  . Benign Prostatic Hypertrophy    HPI: Patient is a 61 year old African-American male with BPH and LUT S and erectile dysfunction and chronic pelvic pain who presents to discuss his scrotal ultrasound results.  At his last visit, patient was complaining of bilateral testicular pain.  He stated the pain was so severe he could not have intercourse.  Scrotal ultrasound performed on 01/21/2017 noted a 8 mm mildly heterogeneous left epididymal cyst.  Small left varicocele.  Otherwise normal testicular ultrasound.  He is currently undergoing PT with Dr. Mariane Masters.   He has already seen an improvement in his testicular pain.  He is not experiencing fevers, chills, nausea or vomiting.  He is not having dysuria, gross hematuria or suprapubic pain.   PMH: Past Medical History:  Diagnosis Date  . Anxiety   . Arthritis   . Depression   . Heartburn   . HLD (hyperlipidemia)   . Hypertension   . Kidney stone     Surgical History: Past Surgical History:  Procedure Laterality Date  . NO PAST SURGERIES     verified with patient on 01/15/17    Home Medications:  Allergies as of 01/27/2017      Reactions   Shellfish Allergy Hives   Amlodipine Other (See Comments)   Possible hand swelling   Lisinopril Other (See Comments)   Cough      Medication List        Accurate as of 01/27/17  3:29 PM. Always use your most recent med list.          diclofenac 75 MG EC tablet Commonly known as:  VOLTAREN Take 1 tablet (75 mg total) by mouth 2 (two) times daily.   FLUoxetine 20 MG capsule Commonly known as:  PROZAC Take 1 capsule (20 mg total) by mouth daily.   gabapentin 300 MG capsule Commonly known as:  NEURONTIN 300 mg qhs for 2 weeks then 300 mg BID   hydrochlorothiazide 25 MG  tablet Commonly known as:  HYDRODIURIL Take 25 mg by mouth daily.   omeprazole 20 MG capsule Commonly known as:  PRILOSEC TAKE 1 CAPSULE (20 MG) BY MOUTH ONCE DAILY   tamsulosin 0.4 MG Caps capsule Commonly known as:  FLOMAX Take 2 capsules (0.8 mg total) by mouth daily.       Allergies:  Allergies  Allergen Reactions  . Shellfish Allergy Hives  . Amlodipine Other (See Comments)    Possible hand swelling  . Lisinopril Other (See Comments)    Cough    Family History: Family History  Problem Relation Age of Onset  . Heart disease Mother   . Colon cancer Mother   . Diabetes Father   . Prostate cancer Father   . Colon cancer Brother   . Heart disease Brother   . Kidney cancer Neg Hx   . Bladder Cancer Neg Hx     Social History:  reports that he has been smoking cigarettes.  He has a 23.00 pack-year smoking history. he has never used smokeless tobacco. He reports that he drinks alcohol. He reports that he does not use drugs.  ROS: UROLOGY Frequent Urination?: No Hard to postpone urination?: No Burning/pain with urination?: No Get up at night to urinate?: No Leakage of urine?: No Urine stream starts and stops?: No  Trouble starting stream?: No Do you have to strain to urinate?: No Blood in urine?: No Urinary tract infection?: No Sexually transmitted disease?: No Injury to kidneys or bladder?: No Painful intercourse?: No Weak stream?: No Erection problems?: No Penile pain?: No  Gastrointestinal Nausea?: No Vomiting?: No Indigestion/heartburn?: No Diarrhea?: No Constipation?: No  Constitutional Fever: No Night sweats?: No Weight loss?: No Fatigue?: No  Skin Skin rash/lesions?: Yes Itching?: No  Eyes Blurred vision?: Yes Double vision?: No  Ears/Nose/Throat Sore throat?: No Sinus problems?: No  Hematologic/Lymphatic Swollen glands?: No Easy bruising?: No  Cardiovascular Leg swelling?: No Chest pain?: No  Respiratory Cough?:  No Shortness of breath?: Yes  Endocrine Excessive thirst?: No  Musculoskeletal Back pain?: No Joint pain?: Yes  Neurological Headaches?: No Dizziness?: No  Psychologic Depression?: No Anxiety?: No  Physical Exam: BP 124/86   Pulse 85   Ht 5\' 10"  (1.778 m)   Wt 219 lb (99.3 kg)   BMI 31.42 kg/m   Constitutional: Well nourished. Alert and oriented, No acute distress. HEENT: [place] AT, moist mucus membranes. Trachea midline, no masses. Cardiovascular: No clubbing, cyanosis, or edema. Respiratory: Normal respiratory effort, no increased work of breathing. Skin: No rashes, bruises or suspicious lesions. Lymph: No cervical or inguinal adenopathy. Neurologic: Grossly intact, no focal deficits, moving all 4 extremities. Psychiatric: Normal mood and affect.  Laboratory Data: PSA History  1.1 in 02/2013  0.6 in 11/2013  1.1 in 11/2016 Lab Results  Component Value Date   WBC 8.1 08/12/2016   HGB 14.9 08/12/2016   HCT 43.6 08/12/2016   MCV 88.8 08/12/2016   PLT 282 08/12/2016    Lab Results  Component Value Date   CREATININE 1.04 08/12/2016    Lab Results  Component Value Date   AST 21 08/12/2016   Lab Results  Component Value Date   ALT 23 08/12/2016    Urinalysis Negative.  See Epic.   Results for orders placed or performed in visit on 01/21/17  Compliance Drug Analysis, Ur  Result Value Ref Range   Summary FINAL     I have reviewed the labs.  Pertinent imaging CLINICAL DATA:  Initial evaluation for scrotal pain, epididymal cyst.  EXAM: SCROTAL ULTRASOUND  DOPPLER ULTRASOUND OF THE TESTICLES  TECHNIQUE: Complete ultrasound examination of the testicles, epididymis, and other scrotal structures was performed. Color and spectral Doppler ultrasound were also utilized to evaluate blood flow to the testicles.  COMPARISON:  None.  FINDINGS: Right testicle  Measurements: 4.6 x 3.6 x 3.4 cm. No mass or microlithiasis visualized.  Left  testicle  Measurements: 4.4 x 2.9 x 3.9 cm. No mass or microlithiasis visualized.  Right epididymis:  Normal in size and appearance.  Left epididymis: 8 mm exophytic mildly heterogeneous left epididymal cyst. Left epididymis otherwise normal size in appearance.  Hydrocele:  None visualized.  Varicocele:  Small left varicocele.  Pulsed Doppler interrogation of both testes demonstrates normal low resistance arterial and venous waveforms bilaterally.  IMPRESSION: 1. 8 mm mildly heterogeneous left epididymal cyst. 2. Small left varicocele. 3. Otherwise normal testicular ultrasound.   Electronically Signed    By: Rise MuBenjamin  McClintock M.D.   On: 01/21/2017 21:53  I have reviewed the labs.  Assessment & Plan:    1. Pelvic pain  - will reassess after blood work has returned  - scrotal ultrasound did not identify any etiology for his pain  - seeing improvement with PT  - RTC in 3 months  Return in about 3 months (around  04/27/2017) for IPSS, SHIM and exam.  These notes generated with voice recognition software. I apologize for typographical errors.  Michiel CowboySHANNON Anika Shore, PA-C  [facility] [address], [address] [place], [place] [address] [phone]

## 2017-02-10 ENCOUNTER — Ambulatory Visit: Payer: No Typology Code available for payment source | Admitting: Physical Therapy

## 2017-02-13 ENCOUNTER — Other Ambulatory Visit: Payer: Self-pay

## 2017-02-13 ENCOUNTER — Encounter: Payer: Self-pay | Admitting: Student in an Organized Health Care Education/Training Program

## 2017-02-13 ENCOUNTER — Ambulatory Visit
Payer: Self-pay | Attending: Student in an Organized Health Care Education/Training Program | Admitting: Student in an Organized Health Care Education/Training Program

## 2017-02-13 VITALS — BP 130/88 | HR 70 | Temp 97.7°F | Ht 69.0 in | Wt 215.0 lb

## 2017-02-13 DIAGNOSIS — F329 Major depressive disorder, single episode, unspecified: Secondary | ICD-10-CM | POA: Insufficient documentation

## 2017-02-13 DIAGNOSIS — F17210 Nicotine dependence, cigarettes, uncomplicated: Secondary | ICD-10-CM | POA: Insufficient documentation

## 2017-02-13 DIAGNOSIS — G8929 Other chronic pain: Secondary | ICD-10-CM | POA: Insufficient documentation

## 2017-02-13 DIAGNOSIS — N401 Enlarged prostate with lower urinary tract symptoms: Secondary | ICD-10-CM | POA: Insufficient documentation

## 2017-02-13 DIAGNOSIS — Z91013 Allergy to seafood: Secondary | ICD-10-CM | POA: Insufficient documentation

## 2017-02-13 DIAGNOSIS — Z87442 Personal history of urinary calculi: Secondary | ICD-10-CM | POA: Insufficient documentation

## 2017-02-13 DIAGNOSIS — I10 Essential (primary) hypertension: Secondary | ICD-10-CM | POA: Insufficient documentation

## 2017-02-13 DIAGNOSIS — R338 Other retention of urine: Secondary | ICD-10-CM | POA: Insufficient documentation

## 2017-02-13 DIAGNOSIS — F419 Anxiety disorder, unspecified: Secondary | ICD-10-CM | POA: Insufficient documentation

## 2017-02-13 DIAGNOSIS — F32A Depression, unspecified: Secondary | ICD-10-CM

## 2017-02-13 DIAGNOSIS — Z79899 Other long term (current) drug therapy: Secondary | ICD-10-CM | POA: Insufficient documentation

## 2017-02-13 DIAGNOSIS — E785 Hyperlipidemia, unspecified: Secondary | ICD-10-CM | POA: Insufficient documentation

## 2017-02-13 DIAGNOSIS — M25512 Pain in left shoulder: Secondary | ICD-10-CM

## 2017-02-13 DIAGNOSIS — M19012 Primary osteoarthritis, left shoulder: Secondary | ICD-10-CM | POA: Insufficient documentation

## 2017-02-13 DIAGNOSIS — N529 Male erectile dysfunction, unspecified: Secondary | ICD-10-CM | POA: Insufficient documentation

## 2017-02-13 MED ORDER — HYDROCODONE-ACETAMINOPHEN 5-325 MG PO TABS: 1.0000 | ORAL_TABLET | Freq: Two times a day (BID) | ORAL | 0 refills | Status: DC | PRN

## 2017-02-13 NOTE — Patient Instructions (Addendum)
1. MRI of left shoulder 2. Sign opioid contract 3. Rx for Norco for 1 month Prescription given for [employer] and contract signed

## 2017-02-13 NOTE — Progress Notes (Signed)
Patient's Name: Carlos Reynolds  MRN: [id number]  Referring Provider: Mikey College, *  DOB: [birth year]  PCP: Mikey College, NP  DOS: 02/13/2017  Note by: Gillis Santa, MD  Service setting: Ambulatory outpatient  Specialty: Interventional Pain Management  Location: [facility] (AMB) Pain Management Facility    Patient type: Established   Primary Reason(s) for Visit: Encounter for evaluation before starting new chronic pain management plan of care (Level of risk: moderate) CC: Shoulder Pain (left)  HPI  Carlos Reynolds is a 60 y.o. year old, male patient, who comes today for a follow-up evaluation to review the test results and decide on a treatment plan. He has Osteoarthritis of left shoulder; Benign prostatic hyperplasia with incomplete bladder emptying; Erectile dysfunction; Current moderate episode of major depressive disorder without prior episode ([place]); Eczema; Depression; Chronic left shoulder pain; and Chronic pain syndrome on their problem list. His primarily concern today is the Shoulder Pain (left)  Pain Assessment: Location: Left Shoulder Radiating: Radiates down middle of arm Onset: More than a month ago Duration: Chronic pain Quality: Aching Severity: 10-Worst pain ever/10 (self-reported pain score)  Note: Reported level is inconsistent with clinical observations. Clinically the patient looks like a 5/10 A 5/10 is viewed as "Severe" and described as intense and extremely unpleasant. Associated with frowning face and frequent crying. Pain overwhelms the senses.  Ability to do any activity or maintain social relationships becomes significantly limited. Conversation becomes difficult. Pacing back and forth is common, as getting into a comfortable position is nearly impossible. Pain wakes you up from deep sleep. Physical signs will be obvious: pupillary dilation; increased sweating; goosebumps; brisk reflexes; cold, clammy hands and feet; nausea, vomiting or dry heaves; loss of  appetite; significant sleep disturbance with inability to fall asleep or to remain asleep. When persistent, significant weight loss is observed due to the complete loss of appetite and sleep deprivation.  Blood pressure and heart rate becomes significantly elevated.       When using our objective Pain Scale, levels between 6 and 10/10 are said to belong in an emergency room, as it progressively worsens from a 6/10, described as severely limiting, requiring emergency care not usually available at an outpatient pain management facility. At a 6/10 level, communication becomes difficult and requires great effort. Assistance to reach the emergency department may be required. Facial flushing and profuse sweating along with potentially dangerous increases in heart rate and blood pressure will be evident. Effect on ADL:  Severely limits Timing: Constant Modifying factors: Rest in a position with arms up  Carlos Reynolds comes in today for a follow-up visit after his initial evaluation on 01/21/2017. Today we went over the results of his tests. These were explained in "Layman's terms". During today's appointment we went over my diagnostic impression, as well as the proposed treatment plan.  Patient returns today for follow-up.  He continues to endorse very severe left shoulder pain.  He states that it is very debilitating and it limits his ability to perform activities of daily living.  He is requesting to obtain a MRI of his left shoulder.  He is frustrated that nothing is helping.  He was started on gabapentin 300 mg twice daily which he states is not helping.  He is not endorsing any side effects of sedation, confusion, leg swelling.  In considering the treatment plan options, Carlos Reynolds was reminded that I no longer take patients for medication management only. I asked him to let me know if he had no  intention of taking advantage of the interventional therapies, so that we could make arrangements to provide this  space to someone interested. I also made it clear that undergoing interventional therapies for the purpose of getting pain medications is very inappropriate on the part of a patient, and it will not be tolerated in this practice. This type of behavior would suggest true addiction and therefore it requires referral to an addiction specialist.   Further details on both, my assessment(s), as well as the proposed treatment plan, please see below.  Controlled Substance Pharmacotherapy Assessment REMS (Risk Evaluation and Mitigation Strategy)  Analgesic: Hydrocodone 5 mg twice daily as needed, quantity 17-monthMME/day: 10 mg/day. Pill Count: None expected due to no prior prescriptions written by our practice. No notes on file Pharmacokinetics: Liberation and absorption (onset of action): WNL Distribution (time to peak effect): WNL Metabolism and excretion (duration of action): WNL         Pharmacodynamics: Desired effects: Analgesia: Carlos Reynolds >50% benefit. Functional ability: Patient reports that medication allows him to accomplish basic ADLs Clinically meaningful improvement in function (CMIF): Sustained CMIF goals met Perceived effectiveness: Described as relatively effective, allowing for increase in activities of daily living (ADL) Undesirable effects: Side-effects or Adverse reactions: None reported Monitoring: [place] PMP: Online review of the past 127-montheriod previously conducted. Not applicable at this point since we have not taken over the patient's medication management yet. List of other Serum/Urine Drug Screening Test(s):  No results found for: AMPHSCRSER, BARBSCRSER, BENZOSCRSER, COCAINSCRSER, COCAINSCRNUR, PCCarnegieTHCSCRSER, THCU, CAN[place] of all UDS test(s) done:  Lab Results  Component Value Date   SUMMARY FINAL 01/21/2017   Last UDS on record: Summary  Date Value Ref Range Status  01/21/2017 FINAL  Final     Comment:    ==================================================================== TOXASSURE COMP DRUG ANALYSIS,UR ==================================================================== Test                             Result       Flag       Units Drug Present and Declared for Prescription Verification   Fluoxetine                     PRESENT      EXPECTED   Norfluoxetine                  PRESENT      EXPECTED    Norfluoxetine is an expected metabolite of fluoxetine. Drug Absent but Declared for Prescription Verification   Gabapentin                     Not Detected UNEXPECTED   Diclofenac                     Not Detected UNEXPECTED    Diclofenac, as indicated in the declared medication list, is not    always detected even when used as directed. ==================================================================== Test                      Result    Flag   Units      Ref Range   Creatinine              57               mg/dL      >=20 ==================================================================== Declared Medications:  The  flagging and interpretation on this report are based on the  following declared medications.  Unexpected results may arise from  inaccuracies in the declared medications.  **Note: The testing scope of this panel includes these medications:  Fluoxetine  Gabapentin  **Note: The testing scope of this panel does not include small to  moderate amounts of these reported medications:  Diclofenac  **Note: The testing scope of this panel does not include following  reported medications:  Hydrochlorothiazide  Omeprazole  Sildenafil  Tamsulosin ==================================================================== For clinical consultation, please call [phone]. ====================================================================    UDS interpretation: No unexpected findings.          Medication Assessment Form: Patient introduced to form today Treatment  compliance: Treatment may start today if patient agrees with proposed plan. Evaluation of compliance is not applicable at this point Risk Assessment Profile: Aberrant behavior: See initial evaluations. None observed or detected today Comorbid factors increasing risk of overdose: See initial evaluation. No additional risks detected today Medical Psychology Evaluation: Low Risk Opioid Risk Tool - 02/13/17 1329      Family History of Substance Abuse   Alcohol  Negative    Illegal Drugs  Negative    Rx Drugs  Negative      Personal History of Substance Abuse   Alcohol  Negative    Illegal Drugs  Negative    Rx Drugs  Negative      Age   Age between 41-45 years   No      History of Preadolescent Sexual Abuse   History of Preadolescent Sexual Abuse  Negative or Male      Psychological Disease   Psychological Disease  Negative    Depression  Positive      Total Score   Opioid Risk Tool Scoring  1    Opioid Risk Interpretation  Low Risk      ORT Scoring interpretation table:  Score <3 = Low Risk for SUD  Score between 4-7 = Moderate Risk for SUD  Score >8 = High Risk for Opioid Abuse   Risk Mitigation Strategies:  Patient opioid safety counseling: Completed today. Counseling provided to patient as per "Patient Counseling Document". Document signed by patient, attesting to counseling and understanding Patient-Prescriber Agreement (PPA): Obtained today.  Controlled substance notification to other providers: Written and sent today.  Pharmacologic Plan: Today we may be taking over the patient's pharmacological regimen. See below             Laboratory Chemistry  Inflammation Markers (CRP: Acute Phase) (ESR: Chronic Phase) No results found for: CRP, ESRSEDRATE, LATICACIDVEN               Rheumatology Markers No results found for: RF, ANA, Therisa [place], [facility]              Renal Function Markers Lab Results  Component Value Date   BUN 17 08/12/2016    CREATININE 1.04 08/12/2016   GFRAA >60 08/12/2016   GFRNONAA >60 08/12/2016                 Hepatic Function Markers Lab Results  Component Value Date   AST 21 08/12/2016   ALT 23 08/12/2016   ALBUMIN 3.8 08/12/2016   ALKPHOS 57 08/12/2016                 Electrolytes Lab Results  Component Value Date   NA 136 08/12/2016   K 3.7 08/12/2016   CL 103 08/12/2016   CALCIUM 8.9 08/12/2016  Neuropathy Markers No results found for: VITAMINB12, FOLATE, HGBA1C, HIV               Bone Pathology Markers Lab Results  Component Value Date   TESTOSTERONE 439 12/04/2016                 Coagulation Parameters Lab Results  Component Value Date   PLT 282 08/12/2016                 Cardiovascular Markers Lab Results  Component Value Date   HGB 14.9 08/12/2016   HCT 43.6 08/12/2016                 CA Markers No results found for: CEA, CA125, LABCA2               Note: Lab results reviewed.   Meds   Current Outpatient Medications:  .  diclofenac (VOLTAREN) 75 MG EC tablet, Take 1 tablet (75 mg total) by mouth 2 (two) times daily., Disp: 60 tablet, Rfl: 2 .  FLUoxetine (PROZAC) 20 MG capsule, Take 1 capsule (20 mg total) by mouth daily., Disp: 30 capsule, Rfl: 3 .  gabapentin (NEURONTIN) 300 MG capsule, 300 mg qhs for 2 weeks then 300 mg BID, Disp: 60 capsule, Rfl: 1 .  hydrochlorothiazide (HYDRODIURIL) 25 MG tablet, Take 25 mg by mouth daily., Disp: , Rfl:  .  omeprazole (PRILOSEC) 20 MG capsule, TAKE 1 CAPSULE (20 MG) BY MOUTH ONCE DAILY, Disp: , Rfl:  .  tamsulosin (FLOMAX) 0.4 MG CAPS capsule, Take 2 capsules (0.8 mg total) by mouth daily., Disp: 60 capsule, Rfl: 5 .  HYDROcodone-acetaminophen (NORCO/VICODIN) 5-325 MG tablet, Take 1 tablet by mouth 2 (two) times daily as needed for moderate pain., Disp: 60 tablet, Rfl: 0  ROS  Constitutional: Denies any fever or chills Gastrointestinal: No reported hemesis, hematochezia, vomiting, or acute GI  distress Musculoskeletal: Denies any acute onset joint swelling, redness, loss of ROM, or weakness Neurological: No reported episodes of acute onset apraxia, aphasia, dysarthria, agnosia, amnesia, paralysis, loss of coordination, or loss of consciousness  Allergies  Carlos Reynolds is allergic to shellfish allergy; amlodipine; and lisinopril.  PFSH  Drug: Carlos Reynolds  reports that he does not use drugs. Alcohol:  reports that he drinks alcohol. Tobacco:  reports that he has been smoking cigarettes.  He has a 23.00 pack-year smoking history. he has never used smokeless tobacco. Medical:  has a past medical history of Anxiety, Arthritis, Depression, Heartburn, HLD (hyperlipidemia), Hypertension, and Kidney stone. Surgical: Carlos Reynolds  has a past surgical history that includes No past surgeries. Family: family history includes Colon cancer in his brother and mother; Diabetes in his father; Heart disease in his brother and mother; Prostate cancer in his father.  Constitutional Exam  General appearance: Well nourished, well developed, and well hydrated. In no apparent acute distress Vitals:   02/13/17 1310  BP: 130/88  Pulse: 70  Temp: 97.7 F (36.5 C)  SpO2: 96%  Weight: 215 lb (97.5 kg)  Height: 5' 9" (1.753 m)   BMI Assessment: Estimated body mass index is 31.75 kg/m as calculated from the following:   Height as of this encounter: 5' 9" (1.753 m).   Weight as of this encounter: 215 lb (97.5 kg).  BMI interpretation table: BMI level Category Range association with higher incidence of chronic pain  <18 kg/m2 Underweight   18.5-24.9 kg/m2 Ideal body weight   25-29.9 kg/m2 Overweight Increased incidence by 20%  30-34.9  kg/m2 Obese (Class I) Increased incidence by 68%  35-39.9 kg/m2 Severe obesity (Class II) Increased incidence by 136%  >40 kg/m2 Extreme obesity (Class III) Increased incidence by 254%   BMI Readings from Last 4 Encounters:  02/13/17 31.75 kg/m  01/27/17 31.42 kg/m   01/21/17 30.85 kg/m  01/15/17 30.95 kg/m   Wt Readings from Last 4 Encounters:  02/13/17 215 lb (97.5 kg)  01/27/17 219 lb (99.3 kg)  01/21/17 215 lb (97.5 kg)  01/15/17 215 lb 11.2 oz (97.8 kg)  Psych/Mental status: Alert, oriented x 3 (person, place, & time)       Eyes: PERLA Respiratory: No evidence of acute respiratory distress  Cervical Spine Area Exam  Skin & Axial Inspection: No masses, redness, edema, swelling, or associated skin lesions Alignment: Symmetrical Functional ROM: Unrestricted ROM      Stability: No instability detected Muscle Tone/Strength: Functionally intact. No obvious neuro-muscular anomalies detected. Sensory (Neurological): Unimpaired Palpation: No palpable anomalies              Upper Extremity (UE) Exam    Side: Right upper extremity  Side: Left upper extremity   Skin & Extremity Inspection: Skin color, temperature, and hair growth are WNL. No peripheral edema or cyanosis. No masses, redness, swelling, asymmetry, or associated skin lesions. No contractures.  Skin & Extremity Inspection: Degenerative deforming arthropathy   Functional ROM: Unrestricted ROM          Functional ROM: Decreased ROM limited shoulder abduction.  Unable to extend beyond 90 degrees, secondary to pain          Muscle Tone/Strength: Functionally intact. No obvious neuro-muscular anomalies detected.  Muscle Tone/Strength: Functionally intact. No obvious neuro-muscular anomalies detected.   Sensory (Neurological): Unimpaired          Sensory (Neurological): Arthropathic arthralgia           Palpation: No palpable anomalies              Palpation: Complains of area being tender to palpation               Specialized Test(s): Deferred         Specialized Test(s): Deferred            Thoracic Spine Area Exam  Skin & Axial Inspection: No masses, redness, or swelling Alignment: Symmetrical Functional ROM: Unrestricted ROM Stability: No instability detected Muscle  Tone/Strength: Functionally intact. No obvious neuro-muscular anomalies detected. Sensory (Neurological): Unimpaired Muscle strength & Tone: No palpable anomalies  Lumbar Spine Area Exam  Skin & Axial Inspection: No masses, redness, or swelling Alignment: Symmetrical Functional ROM: Unrestricted ROM      Stability: No instability detected Muscle Tone/Strength: Functionally intact. No obvious neuro-muscular anomalies detected. Sensory (Neurological): Unimpaired Palpation: No palpable anomalies       Provocative Tests: Lumbar Hyperextension and rotation test: evaluation deferred today       Lumbar Lateral bending test: evaluation deferred today       Patrick's Maneuver: evaluation deferred today                    Gait & Posture Assessment  Ambulation: Unassisted Gait: Relatively normal for age and body habitus Posture: WNL   Lower Extremity Exam    Side: Right lower extremity  Side: Left lower extremity  Skin & Extremity Inspection: Skin color, temperature, and hair growth are WNL. No peripheral edema or cyanosis. No masses, redness, swelling, asymmetry, or associated skin lesions. No contractures.  Skin & Extremity  Inspection: Skin color, temperature, and hair growth are WNL. No peripheral edema or cyanosis. No masses, redness, swelling, asymmetry, or associated skin lesions. No contractures.  Functional ROM: Unrestricted ROM          Functional ROM: Unrestricted ROM          Muscle Tone/Strength: Functionally intact. No obvious neuro-muscular anomalies detected.  Muscle Tone/Strength: Functionally intact. No obvious neuro-muscular anomalies detected.  Sensory (Neurological): Unimpaired  Sensory (Neurological): Unimpaired  Palpation: No palpable anomalies  Palpation: No palpable anomalies   Assessment & Plan  Primary Diagnosis & Pertinent Problem List: The primary encounter diagnosis was Primary osteoarthritis of left shoulder. Diagnoses of Chronic left shoulder pain and Depression,  unspecified depression type were also pertinent to this visit.  Visit Diagnosis: 1. Primary osteoarthritis of left shoulder   2. Chronic left shoulder pain   3. Depression, unspecified depression type    61 year old male with a history of left shoulder pain secondary to left shoulder osteoarthritis and left rotator cuff dysfunction who is tried physical therapy as well as left shoulder steroid injections which were not helpful for his shoulder pain.  Patient notes significant pain in his shoulder that limits his ability to perform activities of daily living has also resulted in depression. Because left shoulder joint injections have been unhelpful, we will not plan on repeating.  Treatment plan were primarily consist of medication management since patient has tried interventional therapies along with physical therapy and they have not been very effective.  For depression, patient is on Prozac.  At the patient's last visit, he was started on gabapentin 300 mg twice daily and diclofenac 75 mg twice daily.  He does not endorse any significant benefit from the addition of these 2 medications.  He also does not endorse any side effects from the gabapentin either.  Because the patient's left shoulder pain is worsening and he is having significant disability, we discussed performing a left shoulder MRI.  Patient is in agreement with the plan.  Furthermore I will have the patient signed an opiate agreement with our clinic today and provided a prescription of hydrocodone 5 mg twice daily as needed for breakthrough pain.  Patient instructed to continue gabapentin 300 mg twice daily, diclofenac 75 mill grams twice daily, as well as his antidepressant Prozac.  Plan: -Left shoulder MRI without contrast to evaluate worsening left shoulder pain -Sign opioid contract -Prescription for hydrocodone as below. -Continue gabapentin and diclofenac as prescribed -Continue application of Voltaren gel to left shoulder  region    Plan of Care  Pharmacotherapy (Medications Ordered): Meds ordered this encounter  Medications  . HYDROcodone-acetaminophen (NORCO/VICODIN) 5-325 MG tablet    Sig: Take 1 tablet by mouth 2 (two) times daily as needed for moderate pain.    Dispense:  60 tablet    Refill:  0    Do not place this medication, or any other prescription from our practice, on "Automatic Refill". Patient may have prescription filled one day early if pharmacy is closed on scheduled refill date.  For chronic pain, to last 30 days from fill date   Lab-work, procedure(s), and/or referral(s): Orders Placed This Encounter  Procedures  . MR SHOULDER LEFT WO CONTRAST   Time Note: Greater than 50% of the 25 minute(s) of face-to-face time spent with Carlos Reynolds, was spent in counseling/coordination of care regarding: Carlos Reynolds primary cause of pain, the treatment plan, treatment alternatives, medication side effects, the opioid analgesic risks and possible complications and the medication agreement.  Provider-requested follow-up: Return in about 4 weeks (around 03/13/2017) for Medication Management, After Imaging.  Future Appointments  Date Time Provider [place]  03/04/2017  1:30 PM Jerl Mina, PT [facility] None  03/10/2017  1:30 PM Gillis Santa, MD [facility] None  03/11/2017 11:00 AM Jerl Mina, PT [facility] None  03/25/2017  1:00 PM Jerl Mina, PT [facility] None  04/08/2017  1:30 PM Jerl Mina, PT [facility] None  04/22/2017  1:00 PM Jerl Mina, PT [facility] None  05/01/2017  4:15 PM McGowan, Hunt Oris, PA-C [facility] None    Primary Care Physician: Mikey College, NP Location: [facility] Note by: Gillis Santa, M.D Date: 02/13/2017; Time: 3:12 PM  Patient Instructions  1. MRI of left shoulder 2. Sign opioid contract 3. Rx for Norco for 1 month Prescription given for [employer] and contract signed

## 2017-03-03 ENCOUNTER — Encounter: Payer: No Typology Code available for payment source | Admitting: Physical Therapy

## 2017-03-04 ENCOUNTER — Ambulatory Visit: Payer: Self-pay | Attending: Urology | Admitting: Physical Therapy

## 2017-03-04 DIAGNOSIS — M2170 Unequal limb length (acquired), unspecified site: Secondary | ICD-10-CM | POA: Insufficient documentation

## 2017-03-04 DIAGNOSIS — G8929 Other chronic pain: Secondary | ICD-10-CM | POA: Insufficient documentation

## 2017-03-04 DIAGNOSIS — M25512 Pain in left shoulder: Secondary | ICD-10-CM | POA: Insufficient documentation

## 2017-03-04 DIAGNOSIS — M791 Myalgia, unspecified site: Secondary | ICD-10-CM | POA: Insufficient documentation

## 2017-03-04 DIAGNOSIS — M533 Sacrococcygeal disorders, not elsewhere classified: Secondary | ICD-10-CM | POA: Insufficient documentation

## 2017-03-04 NOTE — Patient Instructions (Addendum)
In bed   Pelvic tilts 10 reps without pushing the belly    Wobble knees side to side  10 reps     Figure -4 stretch and take 5 breaths    __________  Seated: figure 4 stretch with hand propped by your side  5 breaths    Standing by wall, hand on wall:  ski track ( feet are hip width apart )  Mini-lunges with front knee above ankle and not moving     One foot back: toes tucked, heels and toes point forward    bend the back knee and straighten   Have 50% weight int he front and 50% in the back leg   10 reps  Each    ___________ L shoulder:  Shoulder squeeze together and down  5 sec counts x 10 reps   X 2 x day laying on back

## 2017-03-06 NOTE — Therapy (Signed)
[place] MAIN [facility] SERVICES [address] [place], [place], [address] Phone: [phone]   Fax:  [phone]  Physical Therapy Treatment  Patient Details  Name: Carlos Reynolds MRN: [id number] Date of Birth: 03/01/1955 No Data Recorded  Encounter Date: 03/04/2017  PT End of Session - 03/06/17 0851    Visit Number  2    Number of Visits  12    Date for PT Re-Evaluation  04/21/17    PT Start Time  1302    PT Stop Time  1404    PT Time Calculation (min)  62 min    Activity Tolerance  Patient tolerated treatment well    Behavior During Therapy  [facility] for tasks assessed/performed       Past Medical History:  Diagnosis Date  . Anxiety   . Arthritis   . Depression   . Heartburn   . HLD (hyperlipidemia)   . Hypertension   . Kidney stone     Past Surgical History:  Procedure Laterality Date  . NO PAST SURGERIES     verified with patient on 01/15/17    There were no vitals filed for this visit.  Subjective Assessment - 03/06/17 0911    Subjective  Pt felt good and less scrotal pain by 60% after last session one month ago. Pt The pain did not hurt as much  and was less with erections.  Pt kept doing his exercises without problems . But this morning the pain came back when he woke up and it was aweful and was worst than before. It lasted 2-3 hours.  his wife was going to take him tot he ER but pt waited out.  Pt reports his pain is 8/10.  Pt is taking pain medication for his L shoulder.       Pertinent History  Pt recently signed up for disability. Pt would love to go back to work. Pt would like to return to working with [employer].     Patient Stated Goals  comfort , relief,          [facility] PT Assessment - 03/06/17 0901      Palpation   Spinal mobility  firest rib elevation B, reported tingling to elbow crease ( decreased post Tx) , increased medial scapula mm tensions L      Palpation comment   ischicavernosus B with tensions and  tenderness                  [facility] Adult PT Treatment/Exercise - 03/06/17 0901      Exercises   Exercises  -- see pt instructions, shoe lift fitting on L foot       Manual Therapy   Manual therapy comments  STM along medial scapula, STM at ischicavernosus B , depression of 1st rib Grade III . median nn glides on L UE                   PT Long Term Goals - 01/27/17 1405      PT LONG TERM GOAL #1   Title  Pt will report decreased scrotum pain from 10/10 to < 5/10 in order to improve QOL     Time  4    Period  Weeks    Status  New    Target Date  02/24/17      PT LONG TERM GOAL #2   Title  Pt will return to regular bowel movements 2x/ day instead of needing  a laxative in order to restore pelvic floor function    Time  10    Period  Weeks    Status  New    Target Date  04/07/17      PT LONG TERM GOAL #3   Title  Pt will report no tenderness/ and demo no tensions at ischiocavernosus B across 2 visits in order to progress to deep core exercises     Time  2    Period  Weeks    Status  New    Target Date  02/10/17      PT LONG TERM GOAL #4   Title  Pt will decrease QUICK DASH score  from % to < % in order to restore L shoulder function to sleep in the same bed as his wife, pull doors, open jars, and lift grandchildren      Time  12    Period  Weeks    Status  New    Target Date  04/21/17      PT LONG TERM GOAL #5   Title  Pt will decrease his PDI score from % to % in order to improve QOL related to scrotal pain    Time  12    Period  Weeks    Status  New    Target Date  04/21/17      Additional Long Term Goals   Additional Long Term Goals  Yes      PT LONG TERM GOAL #6   Title  Pt will demo proper lifting mechanics with 40 lbs without shoulder/ pelvic pain in order to lift grandchildren     Time  12    Period  Weeks    Status  New    Target Date  04/21/17            Plan - 03/06/17 0900    Clinical Impression Statement  Pt demo'd  decreased pelvic floor mm tensions and hip extension mobility.  Pt reported decreased pelvic pain and showed increased hip extension mobility following Tx. Pt also showed decreased mm tensions medial to L scapular and improved scapular mobility.  Plan to assess L shoulder more thoroughly next session. Pt continues to benefit from skilled PT.     Rehab Potential  Good    PT Frequency  2x / week    PT Duration  12 weeks    PT Treatment/Interventions  Manual lymph drainage;Neuromuscular re-education;Moist Heat;Therapeutic activities;Functional mobility training;Stair training;Gait training;Patient/family education;Passive range of motion;Manual techniques;Therapeutic exercise    Consulted and Agree with Plan of Care  Patient       Patient will benefit from skilled therapeutic intervention in order to improve the following deficits and impairments:  Decreased activity tolerance, Decreased mobility, Difficulty walking, Increased muscle spasms, Decreased strength, Pain, Decreased range of motion, Decreased safety awareness, Decreased coordination, Postural dysfunction, Improper body mechanics, Decreased endurance, Hypomobility  Visit Diagnosis: Sacrococcygeal disorders, not elsewhere classified  Leg length discrepancy  Chronic left shoulder pain  Myalgia     Problem List Patient Active Problem List   Diagnosis Date Noted  . Depression 01/21/2017  . Chronic left shoulder pain 01/21/2017  . Chronic pain syndrome 01/21/2017  . 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 Benign prostatic hyperplasia with incomplete bladder emptying 11/21/2016  . Erectile dysfunction 11/21/2016  . Current moderate episode of major depressive disorder without prior episode (HCC) 11/21/2016  . Eczema 11/21/2016    Mariane MastersYeung,Shin Yiing ,PT, DPT, E-RYT  03/06/2017, 9:11 AM  [place] [facility] MAIN [facility] SERVICES [address] [place], [place], [address] Phone: [phone]   Fax:   [phone]  Name: Carlos Reynolds MRN: [id number] Date of Birth: 1955-10-05

## 2017-03-10 ENCOUNTER — Encounter: Payer: No Typology Code available for payment source | Admitting: Physical Therapy

## 2017-03-10 ENCOUNTER — Encounter
Payer: No Typology Code available for payment source | Admitting: Student in an Organized Health Care Education/Training Program

## 2017-03-11 ENCOUNTER — Ambulatory Visit: Payer: Self-pay | Admitting: Physical Therapy

## 2017-03-11 VITALS — BP 132/80

## 2017-03-11 DIAGNOSIS — M791 Myalgia, unspecified site: Secondary | ICD-10-CM

## 2017-03-11 DIAGNOSIS — M25512 Pain in left shoulder: Secondary | ICD-10-CM

## 2017-03-11 DIAGNOSIS — M2170 Unequal limb length (acquired), unspecified site: Secondary | ICD-10-CM

## 2017-03-11 DIAGNOSIS — M533 Sacrococcygeal disorders, not elsewhere classified: Secondary | ICD-10-CM

## 2017-03-11 DIAGNOSIS — G8929 Other chronic pain: Secondary | ICD-10-CM

## 2017-03-11 NOTE — Patient Instructions (Addendum)
Reposition yourself onto pillows with 3/4 turn to keep shoulders and chest open not rounded and not head forward     For shoulder blade  strengthen  Add to the seated shoulders back and down and chin tuck and ears lined with shoulders  5 sec holds, 10 reps x 3 x day   For rotator cuff ( shoulder joint)  Stand perpendicular to wall Push rag and had to wall  10 reps counter clock wise and 10 clockwise 10 Left and right  WITHIN THE RADIUS OF PALM  X 3    To overall aerobic health  During commercials: standing marching 2 min x 5 x day ,

## 2017-03-11 NOTE — Therapy (Signed)
[place] MAIN [facility] SERVICES [address] [place], [place], [address] Phone: [phone]   Fax:  [phone]  Physical Therapy Treatment  Patient Details  Name: Carlos Reynolds MRN: [id number] Date of Birth: 1956-02-24 No Data Recorded  Encounter Date: 03/11/2017    Past Medical History:  Diagnosis Date  . Anxiety   . Arthritis   . Depression   . Heartburn   . HLD (hyperlipidemia)   . Hypertension   . Kidney stone     Past Surgical History:  Procedure Laterality Date  . NO PAST SURGERIES     verified with patient on 01/15/17    Vitals:   03/11/17 1150  BP: 132/80    Subjective Assessment - 03/13/17 1025    Subjective  Pt reported he felt for a couple of days after last session. Scrotal pain is still at 60% improvement. Him and his wife are both happy that he only has slight pain with erections and ejaculation.  He notices a slight improvement in his L shoudler since last Tx    Pertinent History  Pt recently signed up for disability. Pt would love to go back to work. Pt would like to return to working with [employer].     Patient Stated Goals  comfort , relief,          [facility] PT Assessment - 03/13/17 1025      AROM   Overall AROM Comments  standing shoulder abd 80 deg L pre Tx, 90 deg post Tx .    hips:  no limitation B       PROM   Overall PROM Comments  hip ext L normal ROM       Palpation   Spinal mobility  no increased tightness medial to L scapula     Palpation comment  L ischiocavernosus, adductor on L                   [facility] Adult PT Treatment/Exercise - 03/13/17 1025      Exercises   Exercises  -- see pt instructions, sleeping modifications       Manual Therapy   Manual therapy comments  long axis distraction a LLE, STM at ischiocevernosus. deep transverse perineal L.  gentle distraction at L [facility] joint/ posterior glide to facilitate proximal activation/ shoudler abd/ L     Kinesiotex  -- over B  shoulders: supraspinatus. midtrap, low trap                   PT Long Term Goals - 01/27/17 1405      PT LONG TERM GOAL #1   Title  Pt will report decreased scrotum pain from 10/10 to < 5/10 in order to improve QOL     Time  4    Period  Weeks    Status  New    Target Date  02/24/17      PT LONG TERM GOAL #2   Title  Pt will return to regular bowel movements 2x/ day instead of needing a laxative in order to restore pelvic floor function    Time  10    Period  Weeks    Status  New    Target Date  04/07/17      PT LONG TERM GOAL #3   Title  Pt will report no tenderness/ and demo no tensions at ischiocavernosus B across 2 visits in order to progress to deep core exercises  Time  2    Period  Weeks    Status  New    Target Date  02/10/17      PT LONG TERM GOAL #4   Title  Pt will decrease QUICK DASH score  from % to < % in order to restore L shoulder function to sleep in the same bed as his wife, pull doors, open jars, and lift grandchildren      Time  12    Period  Weeks    Status  New    Target Date  04/21/17      PT LONG TERM GOAL #5   Title  Pt will decrease his PDI score from % to % in order to improve QOL related to scrotal pain    Time  12    Period  Weeks    Status  New    Target Date  04/21/17      Additional Long Term Goals   Additional Long Term Goals  Yes      PT LONG TERM GOAL #6   Title  Pt will demo proper lifting mechanics with 40 lbs without shoulder/ pelvic pain in order to lift grandchildren     Time  12    Period  Weeks    Status  New    Target Date  04/21/17            Plan - 03/11/17 1309    Clinical Impression Statement Pt is progressing well with report of 60% improvement to his pelvic pain. Addressed remaining L pelvic floor tightness with manual Tx after which pt demo'd less tensions and decreased report of pain.  Pt is also making gradual progress with his L shoulder pain and limited ROM.  Pt demo'd increased active  shoulder abduction of LUE, less forward head posture, and increased downward rotation of scapula  post Tx. Pt tolerated today's Tx without complaints. Pt continues to benefits from skilled PT.    Rehab Potential  Good    PT Frequency  2x / week    PT Duration  12 weeks    PT Treatment/Interventions  Manual lymph drainage;Neuromuscular re-education;Moist Heat;Therapeutic activities;Functional mobility training;Stair training;Gait training;Patient/family education;Passive range of motion;Manual techniques;Therapeutic exercise    Consulted and Agree with Plan of Care  Patient       Patient will benefit from skilled therapeutic intervention in order to improve the following deficits and impairments:  Decreased activity tolerance, Decreased mobility, Difficulty walking, Increased muscle spasms, Decreased strength, Pain, Decreased range of motion, Decreased safety awareness, Decreased coordination, Postural dysfunction, Improper body mechanics, Decreased endurance, Hypomobility  Visit Diagnosis: Sacrococcygeal disorders, not elsewhere classified  Leg length discrepancy  Chronic left shoulder pain  Myalgia     Problem List Patient Active Problem List   Diagnosis Date Noted  . Depression 01/21/2017  . Chronic left shoulder pain 01/21/2017  . Chronic pain syndrome 01/21/2017  . 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 Benign prostatic hyperplasia with incomplete bladder emptying 11/21/2016  . Erectile dysfunction 11/21/2016  . Current moderate episode of major depressive disorder without prior episode (HCC) 11/21/2016  . Eczema 11/21/2016    Mariane MastersYeung,Shin Yiing ,PT, DPT, E-RYT  03/13/2017, 10:32 AM  [place] [facility]IN [facility] [address] [place], [place], [address] Phone: [phone]   Fax:  [phone]  Name: Carlos Reynolds MRN: [id number] Date of Birth: 1955-06-24

## 2017-03-18 ENCOUNTER — Ambulatory Visit: Payer: Self-pay | Admitting: Physical Therapy

## 2017-03-18 ENCOUNTER — Other Ambulatory Visit: Payer: Self-pay

## 2017-03-18 ENCOUNTER — Ambulatory Visit
Payer: No Typology Code available for payment source | Attending: Student in an Organized Health Care Education/Training Program | Admitting: Student in an Organized Health Care Education/Training Program

## 2017-03-18 ENCOUNTER — Encounter: Payer: Self-pay | Admitting: Nurse Practitioner

## 2017-03-18 ENCOUNTER — Encounter: Payer: Self-pay | Admitting: Student in an Organized Health Care Education/Training Program

## 2017-03-18 ENCOUNTER — Ambulatory Visit (INDEPENDENT_AMBULATORY_CARE_PROVIDER_SITE_OTHER): Payer: No Typology Code available for payment source | Admitting: Nurse Practitioner

## 2017-03-18 VITALS — BP 123/71 | HR 63 | Temp 97.6°F | Ht 70.0 in | Wt 226.0 lb

## 2017-03-18 VITALS — BP 152/87 | HR 63 | Temp 97.4°F | Resp 16 | Ht 70.0 in | Wt 215.0 lb

## 2017-03-18 DIAGNOSIS — R3914 Feeling of incomplete bladder emptying: Secondary | ICD-10-CM

## 2017-03-18 DIAGNOSIS — G894 Chronic pain syndrome: Secondary | ICD-10-CM | POA: Insufficient documentation

## 2017-03-18 DIAGNOSIS — G8929 Other chronic pain: Secondary | ICD-10-CM

## 2017-03-18 DIAGNOSIS — M2170 Unequal limb length (acquired), unspecified site: Secondary | ICD-10-CM

## 2017-03-18 DIAGNOSIS — F419 Anxiety disorder, unspecified: Secondary | ICD-10-CM | POA: Insufficient documentation

## 2017-03-18 DIAGNOSIS — N529 Male erectile dysfunction, unspecified: Secondary | ICD-10-CM | POA: Insufficient documentation

## 2017-03-18 DIAGNOSIS — N5082 Scrotal pain: Secondary | ICD-10-CM | POA: Insufficient documentation

## 2017-03-18 DIAGNOSIS — M19012 Primary osteoarthritis, left shoulder: Secondary | ICD-10-CM

## 2017-03-18 DIAGNOSIS — F321 Major depressive disorder, single episode, moderate: Secondary | ICD-10-CM

## 2017-03-18 DIAGNOSIS — L309 Dermatitis, unspecified: Secondary | ICD-10-CM | POA: Insufficient documentation

## 2017-03-18 DIAGNOSIS — F17210 Nicotine dependence, cigarettes, uncomplicated: Secondary | ICD-10-CM | POA: Insufficient documentation

## 2017-03-18 DIAGNOSIS — N401 Enlarged prostate with lower urinary tract symptoms: Secondary | ICD-10-CM

## 2017-03-18 DIAGNOSIS — Z87442 Personal history of urinary calculi: Secondary | ICD-10-CM | POA: Insufficient documentation

## 2017-03-18 DIAGNOSIS — M25512 Pain in left shoulder: Secondary | ICD-10-CM

## 2017-03-18 DIAGNOSIS — Z79899 Other long term (current) drug therapy: Secondary | ICD-10-CM | POA: Insufficient documentation

## 2017-03-18 DIAGNOSIS — M533 Sacrococcygeal disorders, not elsewhere classified: Secondary | ICD-10-CM

## 2017-03-18 DIAGNOSIS — I861 Scrotal varices: Secondary | ICD-10-CM | POA: Insufficient documentation

## 2017-03-18 DIAGNOSIS — E785 Hyperlipidemia, unspecified: Secondary | ICD-10-CM | POA: Insufficient documentation

## 2017-03-18 DIAGNOSIS — F329 Major depressive disorder, single episode, unspecified: Secondary | ICD-10-CM | POA: Insufficient documentation

## 2017-03-18 DIAGNOSIS — Z1211 Encounter for screening for malignant neoplasm of colon: Secondary | ICD-10-CM

## 2017-03-18 DIAGNOSIS — I10 Essential (primary) hypertension: Secondary | ICD-10-CM

## 2017-03-18 DIAGNOSIS — F32A Depression, unspecified: Secondary | ICD-10-CM

## 2017-03-18 DIAGNOSIS — M791 Myalgia, unspecified site: Secondary | ICD-10-CM

## 2017-03-18 DIAGNOSIS — K219 Gastro-esophageal reflux disease without esophagitis: Secondary | ICD-10-CM

## 2017-03-18 MED ORDER — TAMSULOSIN HCL 0.4 MG PO CAPS: 0.8000 mg | ORAL_CAPSULE | Freq: Every day | ORAL | 1 refills | Status: DC

## 2017-03-18 MED ORDER — OMEPRAZOLE 20 MG PO CPDR: DELAYED_RELEASE_CAPSULE | ORAL | 1 refills | Status: DC

## 2017-03-18 MED ORDER — DICLOFENAC SODIUM 75 MG PO TBEC: 75.0000 mg | DELAYED_RELEASE_TABLET | Freq: Two times a day (BID) | ORAL | 1 refills | Status: DC

## 2017-03-18 MED ORDER — HYDROCODONE-ACETAMINOPHEN 5-325 MG PO TABS: 1.0000 | ORAL_TABLET | Freq: Two times a day (BID) | ORAL | 0 refills | Status: DC | PRN

## 2017-03-18 MED ORDER — HYDROCHLOROTHIAZIDE 25 MG PO TABS: 25.0000 mg | ORAL_TABLET | Freq: Every day | ORAL | 1 refills | Status: DC

## 2017-03-18 MED ORDER — FLUOXETINE HCL 20 MG PO CAPS: 20.0000 mg | ORAL_CAPSULE | Freq: Every day | ORAL | 1 refills | Status: AC

## 2017-03-18 NOTE — Progress Notes (Signed)
Nursing Pain Medication Assessment:  Safety precautions to be maintained throughout the outpatient stay will include: orient to surroundings, keep bed in low position, maintain call bell within reach at all times, provide assistance with transfer out of bed and ambulation.  Medication Inspection Compliance: Pill count conducted under aseptic conditions, in front of the patient. Neither the pills nor the bottle was removed from the patient's sight at any time. Once count was completed pills were immediately returned to the patient in their original bottle.  Medication: Hydrocodone/APAP Pill/Patch Count: 0 of 60 pills remain Pill/Patch Appearance: Markings consistent with prescribed medication Bottle Appearance: Standard pharmacy container. Clearly labeled. Filled Date: 5312 / 21 / 2018 Last Medication intake:  Today

## 2017-03-18 NOTE — Progress Notes (Signed)
Subjective:    Patient ID: Carlos Reynolds, male    DOB: Jul 04, 1955, 62 y.o.   MRN: [id number]  Carlos Reynolds is a 62 y.o. male presenting on 03/18/2017 for Depression (medication refill)   HPI Hypertension - He is not checking BP at home or outside of clinic.    - Current medications: hydrochlorothiazide 25 mg once daily, tolerating well without side effects - He is not currently symptomatic. - Pt denies headache, lightheadedness, dizziness, changes in vision, chest tightness/pressure, palpitations, leg swelling, sudden loss of speech or loss of consciousness. - He  reports no regular exercise routine. - His diet is moderate in salt, moderate in fat, and moderate in carbohydrates.  Depression Pt was started on fluoxetime 20 mg once daily at his last visit and notes he is feeling much better.  PHQ 9 scoring is also improved and pt states he has had no more suicidal ideation.  He still has feelings of being down about his physical limitations, but these thoughts and feelings are not affecting his ability to function on a daily basis anymore.  GERD - Pt is currently taking omeprazole for GERD symptoms and is tolerating well without side effects. He is not currently symptomatic.  - He denies melena, hematochezia, hematemesis, and coffee ground emesis.  - Risk factors present for GERD include obesity.  - No prior EGD.  Depression screen [facility] 2/9 03/18/2017 03/18/2017 01/21/2017 11/18/2016  Decreased Interest 1 0 3 3  Down, Depressed, Hopeless 3 0 3 3  PHQ - 2 Score 4 0 6 6  Altered sleeping 3 3 3 3   Tired, decreased energy 3 3 3 3   Change in appetite 0 0 0 3  Feeling bad or failure about yourself  2 3 3 3   Trouble concentrating 0 0 3 3  Moving slowly or fidgety/restless 0 0 3 2  Suicidal thoughts 0 0 3 3  PHQ-9 Score 12 9 24 26   Difficult doing work/chores Not difficult at all Not difficult at all Very difficult Very difficult    Social History   Tobacco Use  . Smoking status:  Current Every Day Smoker    Packs/day: 0.50    Years: 46.00    Pack years: 23.00    Types: Cigarettes  . Smokeless tobacco: Never Used  Substance Use Topics  . Alcohol use: Yes    Comment: 2 drinks of Liquor daily  . Drug use: No    Review of Systems Per HPI unless specifically indicated above     Objective:    BP 123/71 (BP Location: Right Arm, Patient Position: Sitting, Cuff Size: Large)   Pulse 63   Temp 97.6 F (36.4 C) (Oral)   Ht 5\' 10"  (1.778 m)   Wt 226 lb (102.5 kg)   BMI 32.43 kg/m   Wt Readings from Last 3 Encounters:  03/18/17 226 lb (102.5 kg)  03/18/17 215 lb (97.5 kg)  02/13/17 215 lb (97.5 kg)    Physical Exam  General - obese, well-appearing, NAD HEENT - Normocephalic, atraumatic Neck - supple, non-tender, no LAD, no thyromegaly, no carotid bruit Heart - RRR, no murmurs heard Lungs - Clear throughout all lobes, no wheezing, crackles, or rhonchi. Normal work of breathing. Extremeties - non-tender, no edema, cap refill < 2 seconds, peripheral pulses intact +2 bilaterally Skin - warm, dry Neuro - awake, alert, oriented x3, normal gait Psych - Normal mood and affect, normal behavior    Results for orders placed or performed in visit on 01/21/17  Compliance Drug Analysis, Ur  Result Value Ref Range   Summary FINAL       Assessment & Plan:   Problem List Items Addressed This Visit      Cardiovascular and Mediastinum   Essential hypertension - Primary    Currently controlled hypertension.  BP at goal.  Pt is working on lifestyle modifications.  Taking medications tolerating well without side effects. No currently identified complications.  Plan: 1. Continue taking hydrochlorothiazide 25 mg once daily 2. Encouraged heart healthy diet and increasing exercise to 30 minutes most days of the week. 4. Check BP 1-2 x per week at home, keep log, and bring to clinic at next appointment. 5. Follow up 6 months.        Relevant Medications    hydrochlorothiazide (HYDRODIURIL) 25 MG tablet     Digestive   Gastroesophageal reflux disease    Currently well controlled on omeprazole 20 mg once daily.  Plan: 1. Continue omeprazole 20 mg once daily. Side effects discussed. Pt wants to continue med. 2. Avoid diet triggers. Reviewed need to seek care if globus sensation, difficulty swallowing, s/sx of GI bleed. 3. Follow up as needed and in 6 months.       Relevant Medications   omeprazole (PRILOSEC) 20 MG capsule     Musculoskeletal and Integument   Osteoarthritis of left shoulder    Pt requests refill on diclofenac for shoulder pain.  Encouraged pt to get additional refills from Pain management.  Refill provided today.  Continue treatment with pain management.      Relevant Medications   diclofenac (VOLTAREN) 75 MG EC tablet     Genitourinary   Benign prostatic hyperplasia with incomplete bladder emptying    Stabilization of symptoms.  Is now reconnected with urology office.  Pt has been seen recently by Michiel Cowboy.  Pt notes will run out of tamsulosin prior to his next urology visit.  Will provide refill today, but should transition prescriptions to urology.      Relevant Medications   tamsulosin (FLOMAX) 0.4 MG CAPS capsule     Other   Current moderate episode of major depressive disorder without prior episode (HCC)    Mild depression.  Complicating factors are inability to work r/t left shoulder arthritis, ED, BPH and incontinence symptoms. Pt states he has not experienced SI since starting fluoxetine and feels improvement overall. PHQ9 and GAD7 scores indicated severe depression and severe anxiety and are now significantly improved by > 50%.  Plan: 1. CONTINUE fluoxetine 20 mg once daily.  Encouraged pt to consider increasing to 40 mg, but pt declines. 2. Encouraged stress management strategies. 3. Recommended psychiatry for walk-in if experiencing SI regularly.  Also discussed need to call 9-1-1 if develops SI w/  plan. 4. Recommend counseling also to assist pt w/ coping strategies for life stressors - references provided. 5. Follow up 6 months or sooner if needed.      Relevant Medications   FLUoxetine (PROZAC) 20 MG capsule    Other Visit Diagnoses    Colon cancer screening     Pt requiring colon cancer screening via colonoscopy.  Family history of colon cancer in mother and brother.  Plan: - Mutual decision making discussion for options of colonoscopy vs cologuard.  Pt prefers colonoscopy with positive family history.  - Referral to GI placed.   Relevant Orders   Ambulatory referral to Gastroenterology      Meds ordered this encounter  Medications  . FLUoxetine (PROZAC) 20 MG  capsule    Sig: Take 1 capsule (20 mg total) by mouth daily.    Dispense:  90 capsule    Refill:  1    Order Specific Question:   Supervising Provider    Answer:   Smitty CordsKARAMALEGOS, ALEXANDER J [[id number]]  . hydrochlorothiazide (HYDRODIURIL) 25 MG tablet    Sig: Take 1 tablet (25 mg total) by mouth daily.    Dispense:  90 tablet    Refill:  1    Order Specific Question:   Supervising Provider    Answer:   Smitty CordsKARAMALEGOS, ALEXANDER J [[id number]]  . diclofenac (VOLTAREN) 75 MG EC tablet    Sig: Take 1 tablet (75 mg total) by mouth 2 (two) times daily.    Dispense:  180 tablet    Refill:  1    Order Specific Question:   Supervising Provider    Answer:   Smitty CordsKARAMALEGOS, ALEXANDER J [[id number]]  . tamsulosin (FLOMAX) 0.4 MG CAPS capsule    Sig: Take 2 capsules (0.8 mg total) by mouth daily.    Dispense:  180 capsule    Refill:  1    Order Specific Question:   Supervising Provider    Answer:   Smitty CordsKARAMALEGOS, ALEXANDER J [[id number]]  . omeprazole (PRILOSEC) 20 MG capsule    Sig: TAKE 1 CAPSULE (20 MG) BY MOUTH ONCE DAILY    Dispense:  90 capsule    Refill:  1    Order Specific Question:   Supervising Provider    Answer:   Smitty CordsKARAMALEGOS, ALEXANDER J [[id number]]    Follow up plan: Return in about 6 months (around 09/15/2017) for depression,  hypertension, GERD.  Wilhelmina McardleLauren Raylene Carmickle, DNP, AGPCNP-BC Adult Gerontology Primary Care Nurse Practitioner [facility] 03/25/2017, 1:00 PM

## 2017-03-18 NOTE — Progress Notes (Signed)
Patient's Name: Carlos Reynolds  MRN: [id number]  Referring Provider: Mikey College, *  DOB: [birth year]  PCP: Mikey College, NP  DOS: 03/18/2017  Note by: Gillis Santa, MD  Service setting: Ambulatory outpatient  Specialty: Interventional Pain Management  Location: [facility] (AMB) Pain Management Facility    Patient type: Established   Primary Reason(s) for Visit: Encounter for prescription drug management. (Level of risk: moderate)  CC: Shoulder Pain (left) and Knee Pain (right)  HPI  Carlos Reynolds is a 62 y.o. year old, male patient, who comes today for a medication management evaluation. He has Osteoarthritis of left shoulder; Benign prostatic hyperplasia with incomplete bladder emptying; Erectile dysfunction; Current moderate episode of major depressive disorder without prior episode ([place]); Eczema; Depression; Chronic left shoulder pain; and Chronic pain syndrome on their problem list. His primarily concern today is the Shoulder Pain (left) and Knee Pain (right)  Pain Assessment: Location: Left Shoulder Radiating: denies Onset: More than a month ago Duration: Chronic pain Quality: Constant, Aching Severity: 8 /10 (self-reported pain score)  Note: Reported level is inconsistent with clinical observations. Clinically the patient looks like a 2/10 A 2/10 is viewed as "Mild to Moderate" and described as noticeable and distracting. Impossible to hide from other people. More frequent flare-ups. Still possible to adapt and function close to normal. It can be very annoying and may have occasional stronger flare-ups. With discipline, patients may get used to it and adapt.       When using our objective Pain Scale, levels between 6 and 10/10 are said to belong in an emergency room, as it progressively worsens from a 6/10, described as severely limiting, requiring emergency care not usually available at an outpatient pain management facility. At a 6/10 level, communication becomes difficult and  requires great effort. Assistance to reach the emergency department may be required. Facial flushing and profuse sweating along with potentially dangerous increases in heart rate and blood pressure will be evident. Effect on ADL:   Timing: Constant Modifying factors: physical therapy, medications, heat, voltaren cream  [name] was last scheduled for an appointment on 02/13/2017 for medication management. During today's appointment we reviewed Carlos Reynolds's chronic pain status, as well as his outpatient medication regimen.  Patient returns for follow-up.  He is continuing to endorse severe left shoulder pain.  States that he wants to proceed with MRI.  Order was placed at last visit but will reorder.  Also refill patient's medications which includes Percocet 5 mg twice daily.  The patient  reports that he does not use drugs. His body mass index is 30.85 kg/m.  Further details on both, my assessment(s), as well as the proposed treatment plan, please see below.  Controlled Substance Pharmacotherapy Assessment REMS (Risk Evaluation and Mitigation Strategy)  Analgesic: Percocet 5 mg wice daily as needed MME/day: 10 mg/day.  [name]  03/18/2017  8:54 AM  Signed Nursing Pain Medication Assessment:  Safety precautions to be maintained throughout the outpatient stay will include: orient to surroundings, keep bed in low position, maintain call bell within reach at all times, provide assistance with transfer out of bed and ambulation.  Medication Inspection Compliance: Pill count conducted under aseptic conditions, in front of the patient. Neither the pills nor the bottle was removed from the patient's sight at any time. Once count was completed pills were immediately returned to the patient in their original bottle.  Medication: Hydrocodone/APAP Pill/Patch Count: 0 of 60 pills remain Pill/Patch Appearance: Markings consistent with prescribed medication Bottle  Appearance: Standard pharmacy  container. Clearly labeled. Filled Date: 54 / 21 / 2018 Last Medication intake:  Today   Pharmacokinetics: Liberation and absorption (onset of action): WNL Distribution (time to peak effect): WNL Metabolism and excretion (duration of action): WNL         Pharmacodynamics: Desired effects: Analgesia: Carlos Reynolds reports >50% benefit. Functional ability: Patient reports that medication allows him to accomplish basic ADLs Clinically meaningful improvement in function (CMIF): Sustained CMIF goals met Perceived effectiveness: Described as relatively effective, allowing for increase in activities of daily living (ADL) Undesirable effects: Side-effects or Adverse reactions: None reported Monitoring: [place] PMP: Online review of the past 82-monthperiod conducted. Compliant with practice rules and regulations Last UDS on record: Summary  Date Value Ref Range Status  01/21/2017 FINAL  Final    Comment:    ==================================================================== TOXASSURE COMP DRUG ANALYSIS,UR ==================================================================== Test                             Result       Flag       Units Drug Present and Declared for Prescription Verification   Fluoxetine                     PRESENT      EXPECTED   Norfluoxetine                  PRESENT      EXPECTED    Norfluoxetine is an expected metabolite of fluoxetine. Drug Absent but Declared for Prescription Verification   Gabapentin                     Not Detected UNEXPECTED   Diclofenac                     Not Detected UNEXPECTED    Diclofenac, as indicated in the declared medication list, is not    always detected even when used as directed. ==================================================================== Test                      Result    Flag   Units      Ref Range   Creatinine              57               mg/dL       >=20 ==================================================================== Declared Medications:  The flagging and interpretation on this report are based on the  following declared medications.  Unexpected results may arise from  inaccuracies in the declared medications.  **Note: The testing scope of this panel includes these medications:  Fluoxetine  Gabapentin  **Note: The testing scope of this panel does not include small to  moderate amounts of these reported medications:  Diclofenac  **Note: The testing scope of this panel does not include following  reported medications:  Hydrochlorothiazide  Omeprazole  Sildenafil  Tamsulosin ==================================================================== For clinical consultation, please call ([phone] ====================================================================    UDS interpretation: Compliant          Medication Assessment Form: Reviewed. Patient indicates being compliant with therapy Treatment compliance: Compliant Risk Assessment Profile: Aberrant behavior: See prior evaluations. None observed or detected today Comorbid factors increasing risk of overdose: See prior notes. No additional risks detected today Risk of substance use disorder (SUD): Low Opioid Risk Tool - 03/18/17 04585  Family History of Substance Abuse   Alcohol  Negative    Illegal Drugs  Negative    Rx Drugs  Negative      Personal History of Substance Abuse   Alcohol  Negative    Illegal Drugs  Positive Male or Male    Rx Drugs  Negative      Age   Age between 39-45 years   No      History of Preadolescent Sexual Abuse   History of Preadolescent Sexual Abuse  Negative or Male      Psychological Disease   Psychological Disease  Negative    Depression  Positive      Total Score   Opioid Risk Tool Scoring  5    Opioid Risk Interpretation  Moderate Risk      ORT Scoring interpretation table:  Score <3 = Low Risk for SUD  Score  between 4-7 = Moderate Risk for SUD  Score >8 = High Risk for Opioid Abuse   Risk Mitigation Strategies:  Patient Counseling: Covered Patient-Prescriber Agreement (PPA): Present and active  Notification to other healthcare providers: Done  Pharmacologic Plan: No change in therapy, at this time.             Laboratory Chemistry  Inflammation Markers (CRP: Acute Phase) (ESR: Chronic Phase) No results found for: CRP, ESRSEDRATE, LATICACIDVEN               Rheumatology Markers No results found for: Elayne Guerin, [facility]              Renal Function Markers Lab Results  Component Value Date   BUN 17 08/12/2016   CREATININE 1.04 08/12/2016   GFRAA >60 08/12/2016   GFRNONAA >60 08/12/2016                 Hepatic Function Markers Lab Results  Component Value Date   AST 21 08/12/2016   ALT 23 08/12/2016   ALBUMIN 3.8 08/12/2016   ALKPHOS 57 08/12/2016                 Electrolytes Lab Results  Component Value Date   NA 136 08/12/2016   K 3.7 08/12/2016   CL 103 08/12/2016   CALCIUM 8.9 08/12/2016                 Neuropathy Markers No results found for: VITAMINB12, FOLATE, HGBA1C, HIV               Bone Pathology Markers Lab Results  Component Value Date   TESTOSTERONE 439 12/04/2016                 Coagulation Parameters Lab Results  Component Value Date   PLT 282 08/12/2016                 Cardiovascular Markers Lab Results  Component Value Date   HGB 14.9 08/12/2016   HCT 43.6 08/12/2016                 CA Markers No results found for: CEA, CA125, LABCA2               Note: Lab results reviewed.  Recent Diagnostic Imaging Results  US SCROTUM CLINICAL DATA:  Initial evaluation for scrotal pain, epididymal cyst.  EXAM: SCROTAL ULTRASOUND  DOPPLER ULTRASOUND OF THE TESTICLES  TECHNIQUE: Complete ultrasound examination of the testicles, epididymis, and other scrotal structures was performed. Color and spectral  Doppler ultrasound were also  utilized to evaluate blood flow to the testicles.  COMPARISON:  None.  FINDINGS: Right testicle  Measurements: 4.6 x 3.6 x 3.4 cm. No mass or microlithiasis visualized.  Left testicle  Measurements: 4.4 x 2.9 x 3.9 cm. No mass or microlithiasis visualized.  Right epididymis:  Normal in size and appearance.  Left epididymis: 8 mm exophytic mildly heterogeneous left epididymal cyst. Left epididymis otherwise normal size in appearance.  Hydrocele:  None visualized.  Varicocele:  Small left varicocele.  Pulsed Doppler interrogation of both testes demonstrates normal low resistance arterial and venous waveforms bilaterally.  IMPRESSION: 1. 8 mm mildly heterogeneous left epididymal cyst. 2. Small left varicocele. 3. Otherwise normal testicular ultrasound.  Electronically Signed   By: Jeannine Boga M.D.   On: 01/21/2017 21:53 US PELVIC DOPPLER (TORSION R/O OR MASS ARTERIAL FLOW) CLINICAL DATA:  Initial evaluation for scrotal pain, epididymal cyst.  EXAM: SCROTAL ULTRASOUND  DOPPLER ULTRASOUND OF THE TESTICLES  TECHNIQUE: Complete ultrasound examination of the testicles, epididymis, and other scrotal structures was performed. Color and spectral Doppler ultrasound were also utilized to evaluate blood flow to the testicles.  COMPARISON:  None.  FINDINGS: Right testicle  Measurements: 4.6 x 3.6 x 3.4 cm. No mass or microlithiasis visualized.  Left testicle  Measurements: 4.4 x 2.9 x 3.9 cm. No mass or microlithiasis visualized.  Right epididymis:  Normal in size and appearance.  Left epididymis: 8 mm exophytic mildly heterogeneous left epididymal cyst. Left epididymis otherwise normal size in appearance.  Hydrocele:  None visualized.  Varicocele:  Small left varicocele.  Pulsed Doppler interrogation of both testes demonstrates normal low resistance arterial and venous waveforms bilaterally.  IMPRESSION: 1. 8 mm  mildly heterogeneous left epididymal cyst. 2. Small left varicocele. 3. Otherwise normal testicular ultrasound.  Electronically Signed   By: Jeannine Boga M.D.   On: 01/21/2017 21:53  Complexity Note: Imaging results reviewed. Results shared with Mr. Sada, using Layman's terms.                         Meds   Current Outpatient Medications:  .  diclofenac (VOLTAREN) 75 MG EC tablet, Take 1 tablet (75 mg total) by mouth 2 (two) times daily., Disp: 60 tablet, Rfl: 2 .  FLUoxetine (PROZAC) 20 MG capsule, Take 1 capsule (20 mg total) by mouth daily., Disp: 30 capsule, Rfl: 3 .  gabapentin (NEURONTIN) 300 MG capsule, 300 mg qhs for 2 weeks then 300 mg BID, Disp: 60 capsule, Rfl: 1 .  hydrochlorothiazide (HYDRODIURIL) 25 MG tablet, Take 25 mg by mouth daily., Disp: , Rfl:  .  HYDROcodone-acetaminophen (NORCO/VICODIN) 5-325 MG tablet, Take 1 tablet by mouth 2 (two) times daily as needed for moderate pain., Disp: 60 tablet, Rfl: 0 .  omeprazole (PRILOSEC) 20 MG capsule, TAKE 1 CAPSULE (20 MG) BY MOUTH ONCE DAILY, Disp: , Rfl:  .  tamsulosin (FLOMAX) 0.4 MG CAPS capsule, Take 2 capsules (0.8 mg total) by mouth daily., Disp: 60 capsule, Rfl: 5  ROS  Constitutional: Denies any fever or chills Gastrointestinal: No reported hemesis, hematochezia, vomiting, or acute GI distress Musculoskeletal: Denies any acute onset joint swelling, redness, loss of ROM, or weakness Neurological: No reported episodes of acute onset apraxia, aphasia, dysarthria, agnosia, amnesia, paralysis, loss of coordination, or loss of consciousness  Allergies  Mr. Eriksen is allergic to shellfish allergy; tape; amlodipine; and lisinopril.  PFSH  Drug: Mr. Swor  reports that he does not use drugs. Alcohol:  reports that  he drinks alcohol. Tobacco:  reports that he has been smoking cigarettes.  He has a 23.00 pack-year smoking history. he has never used smokeless tobacco. Medical:  has a past medical history of  Anxiety, Arthritis, Depression, Heartburn, HLD (hyperlipidemia), Hypertension, and Kidney stone. Surgical: Mr. Marcotte  has a past surgical history that includes No past surgeries. Family: family history includes Colon cancer in his brother and mother; Diabetes in his father; Heart disease in his brother and mother; Prostate cancer in his father.  Constitutional Exam  General appearance: Well nourished, well developed, and well hydrated. In no apparent acute distress Vitals:   03/18/17 0834  BP: (!) 152/87  Pulse: 63  Resp: 16  Temp: (!) 97.4 F (36.3 C)  TempSrc: Oral  SpO2: 99%  Weight: 215 lb (97.5 kg)  Height: _0  (1.778 m)   BMI Assessment: Estimated body mass index is 30.85 kg/m as calculated from the following:   Height as of this encounter: _1  (1.778 m).   Weight as of this encounter: 215 lb (97.5 kg).  BMI interpretation table: BMI level Category Range association with higher incidence of chronic pain  <18 kg/m2 Underweight   18.5-24.9 kg/m2 Ideal body weight   25-29.9 kg/m2 Overweight Increased incidence by 20%  30-34.9 kg/m2 Obese (Class I) Increased incidence by 68%  35-39.9 kg/m2 Severe obesity (Class II) Increased incidence by 136%  >40 kg/m2 Extreme obesity (Class III) Increased incidence by 254%   BMI Readings from Last 4 Encounters:  03/18/17 30.85 kg/m  02/13/17 31.75 kg/m  01/27/17 31.42 kg/m  01/21/17 30.85 kg/m   Wt Readings from Last 4 Encounters:  03/18/17 215 lb (97.5 kg)  02/13/17 215 lb (97.5 kg)  01/27/17 219 lb (99.3 kg)  01/21/17 215 lb (97.5 kg)  Psych/Mental status: Alert, oriented x 3 (person, place, & time)       Eyes: PERLA Respiratory: No evidence of acute respiratory distress  Cervical Spine Area Exam  Skin & Axial Inspection: No masses, redness, edema, swelling, or associated skin lesions Alignment: Symmetrical Functional ROM: Unrestricted ROM      Stability: No instability detected Muscle Tone/Strength: Functionally  intact. No obvious neuro-muscular anomalies detected. Sensory (Neurological): Unimpaired Palpation: No palpable anomalies                     Upper Extremity (UE) Exam    Side:Right upper extremity  Side:Left upper extremity   Skin & Extremity Inspection:Skin color, temperature, and hair growth are WNL. No peripheral edema or cyanosis. No masses, redness, swelling, asymmetry, or associated skin lesions. No contractures.  Skin & Extremity Inspection:Degenerative deforming arthropathy   Functional YTK:ZSWFUXNATFTD ROM  Functional DUK:GURKYHCWC ROMlimited shoulder abduction. Unable to extend beyond 90 degrees, secondary to pain   Muscle Tone/Strength:Functionally intact. No obvious neuro-muscular anomalies detected.  Muscle Tone/Strength:Functionally intact. No obvious neuro-muscular anomalies detected.   Sensory (Neurological):Unimpaired  Sensory (Neurological):Arthropathic arthralgia   Palpation:No palpable anomalies  Palpation:Complains of area being tender to palpation   Specialized Test(s):Deferred  Specialized Test(s):Deferred     Thoracic Spine Area Exam  Skin & Axial Inspection: No masses, redness, or swelling Alignment: Symmetrical Functional ROM: Unrestricted ROM Stability: No instability detected Muscle Tone/Strength: Functionally intact. No obvious neuro-muscular anomalies detected. Sensory (Neurological): Unimpaired Muscle strength & Tone: No palpable anomalies  Lumbar Spine Area Exam  Skin & Axial Inspection: No masses, redness, or swelling Alignment: Symmetrical Functional ROM: Unrestricted ROM      Stability: No instability detected Muscle Tone/Strength: Functionally intact.  No obvious neuro-muscular anomalies detected. Sensory (Neurological): Unimpaired Palpation: No palpable anomalies       Provocative Tests: Lumbar Hyperextension and rotation test: evaluation deferred  today       Lumbar Lateral bending test: evaluation deferred today       Patrick's Maneuver: evaluation deferred today                    Gait & Posture Assessment  Ambulation: Unassisted Gait: Relatively normal for age and body habitus Posture: WNL   Lower Extremity Exam    Side: Right lower extremity  Side: Left lower extremity  Skin & Extremity Inspection: Skin color, temperature, and hair growth are WNL. No peripheral edema or cyanosis. No masses, redness, swelling, asymmetry, or associated skin lesions. No contractures.  Skin & Extremity Inspection: Skin color, temperature, and hair growth are WNL. No peripheral edema or cyanosis. No masses, redness, swelling, asymmetry, or associated skin lesions. No contractures.  Functional ROM: Unrestricted ROM          Functional ROM: Unrestricted ROM          Muscle Tone/Strength: Functionally intact. No obvious neuro-muscular anomalies detected.  Muscle Tone/Strength: Functionally intact. No obvious neuro-muscular anomalies detected.  Sensory (Neurological): Unimpaired  Sensory (Neurological): Unimpaired  Palpation: No palpable anomalies  Palpation: No palpable anomalies   Assessment  Primary Diagnosis & Pertinent Problem List: The primary encounter diagnosis was Primary osteoarthritis of left shoulder. Diagnoses of Chronic left shoulder pain, Depression, unspecified depression type, and Chronic pain syndrome were also pertinent to this visit.  Status Diagnosis  Persistent Persistent Persistent 1. Primary osteoarthritis of left shoulder   2. Chronic left shoulder pain   3. Depression, unspecified depression type   4. Chronic pain syndrome      62 year old male with a history of left shoulder pain secondary to left shoulder osteoarthritis and left rotator cuff dysfunction who is tried physical therapy as well as left shoulder steroid injections which were not helpful for his shoulder pain. Patient notes significant pain in his shoulder that  limits his ability to perform activities of daily living has also resulted in depression. Because left shoulder joint injections have been unhelpful, we will not plan on repeating. Treatment plan were primarily consist of medication management since patient has tried interventional therapies along with physical therapy and they have not been very effective. For depression, patient is on Prozac.  Because the patient's left shoulder pain is worsening and he is having significant disability, we discussed performing a left shoulder MRI, an order was placed a list visit. I will replace order and encourage patient to follow up with MRI. Will follow up in 1 month for MM and to discuss left shoulder MRI results.  Patient is in agreement with the plan.   Plan of Care  Pharmacotherapy (Medications Ordered): Meds ordered this encounter  Medications  . HYDROcodone-acetaminophen (NORCO/VICODIN) 5-325 MG tablet    Sig: Take 1 tablet by mouth 2 (two) times daily as needed for moderate pain.    Dispense:  60 tablet    Refill:  0    Do not place this medication, or any other prescription from our practice, on "Automatic Refill". Patient may have prescription filled one day early if pharmacy is closed on scheduled refill date.  For chronic pain, to last 30 days from fill date   Lab-work, procedure(s), and/or referral(s): Orders Placed This Encounter  Procedures  . MR SHOULDER LEFT WO CONTRAST   Time Note: Greater than  50% of the 25 minute(s) of face-to-face time spent with Mr. Cullers, was spent in counseling/coordination of care regarding: the treatment plan, treatment alternatives, the opioid analgesic risks and possible complications and realistic expectations.  Provider-requested follow-up: Return in about 4 weeks (around 04/15/2017) for Medication Management, After Imaging.  Future Appointments  Date Time Provider Earl  03/18/2017 10:00 AM Mikey College, NP [facility] None   03/18/2017  1:00 PM Jerl Mina, PT [facility] None  03/25/2017  1:00 PM Aurea Graff, Shin-Yiing, PT [facility] None  03/27/2017  2:45 PM [facility]  04/01/2017  1:00 PM Aurea Graff, Shin-Yiing, PT [facility] None  04/08/2017  1:30 PM Jerl Mina, PT [facility] None  04/15/2017 10:00 AM Gillis Santa, MD [facility] None  04/17/2017  2:00 PM Jerl Mina, PT [facility] None  04/22/2017  1:00 PM Jerl Mina, PT [facility] None  05/01/2017  4:15 PM McGowan, Hunt Oris, PA-C [facility] None    Primary Care Physician: Mikey College, NP Location: [facility] Note by: Gillis Santa, M.D Date: 03/18/2017; Time: 9:43 AM  Patient Instructions  MRI Left Shoulder Rx Hydro 5-356m x1 in hand

## 2017-03-18 NOTE — Patient Instructions (Addendum)
[name], Thank you for coming in to clinic today.  1. Continue all medications without changes.  2. Continue your physical therapy and your work with pain management.   Please schedule a follow-up appointment with Carlos McardleLauren Burnham Reynolds, AGNP. Return in about 6 months (around 09/15/2017) for depression, hypertension, GERD.  If you have any other questions or concerns, please feel free to call the clinic or send a message through MyChart. You may also schedule an earlier appointment if necessary.  You will receive a survey after today's visit either digitally by e-mail or paper by [employer] mail. Your experiences and feedback matter to us.  Please respond so we know how we are doing as we provide care for you.   Carlos McardleLauren Shannie Kontos, DNP, AGNP-BC Adult Gerontology Nurse Practitioner [facility]

## 2017-03-18 NOTE — Patient Instructions (Signed)
MRI Left Shoulder Rx Hydro 5-325mg  x1 in hand

## 2017-03-18 NOTE — Patient Instructions (Signed)
Seated hip flexor stretch     90 deg in the chair    When sitting in a chair watching TV and after the standing marches or walking   _____________   Walking 6 min x 3 x day  And stretch afterwards     _____________

## 2017-03-18 NOTE — Therapy (Signed)
[facility] MAIN [facility] SERVICES [address] [place], [place], [address] Phone: [phone]   Fax:  [phone]  Physical Therapy Treatment  Patient Details  Name: Carlos Reynolds MRN: [id number] Date of Birth: Oct 03, 1955 No Data Recorded  Encounter Date: 03/18/2017  PT End of Session - 03/18/17 1341    Visit Number  4    Number of Visits  12    Date for PT Re-Evaluation  04/21/17    PT Start Time  1300    PT Stop Time  1355    PT Time Calculation (min)  55 min    Activity Tolerance  Patient tolerated treatment well    Behavior During Therapy  [facility] for tasks assessed/performed       Past Medical History:  Diagnosis Date  . Anxiety   . Arthritis   . Depression   . Heartburn   . HLD (hyperlipidemia)   . Hypertension   . Kidney stone     Past Surgical History:  Procedure Laterality Date  . NO PAST SURGERIES     verified with patient on 01/15/17    There were no vitals filed for this visit.  Subjective Assessment - 03/18/17 1305    Subjective  Pt reported his pelvic pain came back at level of 9/10 after 5 days from last session. Pt's shoulder pain comes nand goes. The adhesive tape made his skin break out. Pt will be getting an MRI next week    Pertinent History  Pt recently signed up for disability. Pt would love to go back to work. Pt would like to return to working with [employer].     Patient Stated Goals  comfort , relief,          [facility] PT Assessment - 03/18/17 1341      AROM   Overall AROM Comments  abduction BUE 90 deg       Palpation   Palpation comment  no tenderness/ tensinos at pelvic floor. tender and tensions at suprapubic area B and adductor attachments  Decreased SIJ mobility B with PAVM PA      Ambulation/Gait   Gait Pattern  -- short stride, heel strike       6 minute walk test results    Endurance additional comments  Pre Tx: SO2 96%,  , Post Tx: SO2 100%  BERG 10/10                    [facility] Adult PT Treatment/Exercise - 03/18/17 1338      Exercises   Exercises  -- 6 min walk test       Manual Therapy   Manual therapy comments  long axis distraction BLE, manual lymph drainage abdomen, legs to pubus,  (post Tx, suprpubic area soft and nontender )    PA mobs Grade III sacrum B in prone. And superior glide B to facilitate increased hip extension             PT Education - 03/18/17 1404    Education provided  Yes    Education Details  HEP    Person(s) Educated  Patient    Methods  Explanation;Demonstration;Tactile cues    Comprehension  Verbalized understanding;Returned demonstration          PT Long Term Goals - 03/18/17 1536      PT LONG TERM GOAL #1   Title  Pt will report decreased scrotum pain from 10/10 to < 5/10 in order to improve  QOL     Time  4    Period  Weeks    Status  On-going      PT LONG TERM GOAL #2   Title  Pt will return to regular bowel movements 2x/ day instead of needing a laxative in order to restore pelvic floor function    Time  10    Period  Weeks    Status  On-going      PT LONG TERM GOAL #3   Title  Pt will report no tenderness/ and demo no tensions at ischiocavernosus B across 2 visits in order to progress to deep core exercises     Time  2    Period  Weeks    Status  Achieved      PT LONG TERM GOAL #4   Title  Pt will decrease QUICK DASH score  from % to < % in order to restore L shoulder function to sleep in the same bed as his wife, pull doors, open jars, and lift grandchildren      Time  12    Period  Weeks    Status  On-going      PT LONG TERM GOAL #5   Title  Pt will decrease his PDI score from % to % in order to improve QOL related to scrotal pain    Time  12    Period  Weeks    Status  On-going      Additional Long Term Goals   Additional Long Term Goals  Yes      PT LONG TERM GOAL #6   Title  Pt will demo proper lifting mechanics with 40 lbs without shoulder/ pelvic pain  in order to lift grandchildren     Time  12    Period  Weeks    Status  On-going      PT LONG TERM GOAL #7   Title  Pt will demo increased endurance with 6 min walk test from 1475 ft to > 161300ft in order to improve cardiovascular health and pelvic mobility to minimize relapse of pain    Time  8    Period  Weeks    Target Date  05/13/17            Plan - 03/18/17 1531    Clinical Impression Statement  Pt showed good carry over with decreased pelvic floor mm tightness/ tenderness. Pt's report of pain at suprapubic area occurred 5 days after last session. Addressed this pain today by improving arthrokinematics for increasing hip extension with manual Tx. Educated pt on hip flexor stretches and minimizing prolonged sitting. Pt was able to complete 6 mi walk test without SOB but he reported feeling fatigue. Pt states he has decreased his cigarettes and beer this week. Pt was encouraged to walk 6 min x 3 x day. Pt voiced understanding. Plan to guide POC for his shoulder after his MRI which he will get next week. Pt continues to benefit from skilled PT.      Rehab Potential  Good    PT Frequency  2x / week    PT Duration  12 weeks    PT Treatment/Interventions  Manual lymph drainage;Neuromuscular re-education;Moist Heat;Therapeutic activities;Functional mobility training;Stair training;Gait training;Patient/family education;Passive range of motion;Manual techniques;Therapeutic exercise    Consulted and Agree with Plan of Care  Patient       Patient will benefit from skilled therapeutic intervention in order to improve the following deficits and impairments:  Decreased  activity tolerance, Decreased mobility, Difficulty walking, Increased muscle spasms, Decreased strength, Pain, Decreased range of motion, Decreased safety awareness, Decreased coordination, Postural dysfunction, Improper body mechanics, Decreased endurance, Hypomobility  Visit Diagnosis: Sacrococcygeal disorders, not elsewhere  classified  Leg length discrepancy  Chronic left shoulder pain  Myalgia     Problem List Patient Active Problem List   Diagnosis Date Noted  . Depression 01/21/2017  . Chronic left shoulder pain 01/21/2017  . Chronic pain syndrome 01/21/2017  . 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 Benign prostatic hyperplasia with incomplete bladder emptying 11/21/2016  . Erectile dysfunction 11/21/2016  . Current moderate episode of major depressive disorder without prior episode (HCC) 11/21/2016  . Eczema 11/21/2016    Mariane Masters ,PT, DPT, E-RYT  03/18/2017, 3:38 PM   [facility] MAIN [facility] SERVICES [address] [place], [place], [address] Phone: [phone]   Fax:  [phone]  Name: Carlos Reynolds MRN: [id number] Date of Birth: 1956-02-23

## 2017-03-24 ENCOUNTER — Encounter: Payer: No Typology Code available for payment source | Admitting: Physical Therapy

## 2017-03-25 ENCOUNTER — Telehealth: Payer: Self-pay | Admitting: Nurse Practitioner

## 2017-03-25 ENCOUNTER — Ambulatory Visit: Payer: Self-pay | Admitting: Physical Therapy

## 2017-03-25 ENCOUNTER — Encounter: Payer: Self-pay | Admitting: Nurse Practitioner

## 2017-03-25 ENCOUNTER — Encounter: Payer: No Typology Code available for payment source | Admitting: Physical Therapy

## 2017-03-25 DIAGNOSIS — M2170 Unequal limb length (acquired), unspecified site: Secondary | ICD-10-CM

## 2017-03-25 DIAGNOSIS — I10 Essential (primary) hypertension: Secondary | ICD-10-CM | POA: Insufficient documentation

## 2017-03-25 DIAGNOSIS — M25512 Pain in left shoulder: Secondary | ICD-10-CM

## 2017-03-25 DIAGNOSIS — G8929 Other chronic pain: Secondary | ICD-10-CM

## 2017-03-25 DIAGNOSIS — M791 Myalgia, unspecified site: Secondary | ICD-10-CM

## 2017-03-25 DIAGNOSIS — K219 Gastro-esophageal reflux disease without esophagitis: Secondary | ICD-10-CM | POA: Insufficient documentation

## 2017-03-25 DIAGNOSIS — N401 Enlarged prostate with lower urinary tract symptoms: Secondary | ICD-10-CM

## 2017-03-25 DIAGNOSIS — M533 Sacrococcygeal disorders, not elsewhere classified: Secondary | ICD-10-CM

## 2017-03-25 DIAGNOSIS — R3914 Feeling of incomplete bladder emptying: Principal | ICD-10-CM

## 2017-03-25 MED ORDER — TAMSULOSIN HCL 0.4 MG PO CAPS: 0.8000 mg | ORAL_CAPSULE | Freq: Every day | ORAL | 1 refills | Status: DC

## 2017-03-25 NOTE — Assessment & Plan Note (Signed)
Mild depression.  Complicating factors are inability to work r/t left shoulder arthritis, ED, BPH and incontinence symptoms. Pt states he has not experienced SI since starting fluoxetine and feels improvement overall. PHQ9 and GAD7 scores indicated severe depression and severe anxiety and are now significantly improved by > 50%.  Plan: 1. CONTINUE fluoxetine 20 mg once daily.  Encouraged pt to consider increasing to 40 mg, but pt declines. 2. Encouraged stress management strategies. 3. Recommended psychiatry for walk-in if experiencing SI regularly.  Also discussed need to call 9-1-1 if develops SI w/ plan. 4. Recommend counseling also to assist pt w/ coping strategies for life stressors - references provided. 5. Follow up 6 months or sooner if needed.

## 2017-03-25 NOTE — Assessment & Plan Note (Signed)
Currently well controlled on omeprazole 20mg once daily.  Plan: 1. Continue omeprazole 20mg once daily. Side effects discussed. Pt wants to continue med. 2. Avoid diet triggers. Reviewed need to seek care if globus sensation, difficulty swallowing, s/sx of GI bleed. 3. Follow up as needed and in 6 months.  

## 2017-03-25 NOTE — Assessment & Plan Note (Signed)
Pt requests refill on diclofenac for shoulder pain.  Encouraged pt to get additional refills from Pain management.  Refill provided today.  Continue treatment with pain management.

## 2017-03-25 NOTE — Assessment & Plan Note (Addendum)
Currently controlled hypertension.  BP at goal.  Pt is working on lifestyle modifications.  Taking medications tolerating well without side effects. No currently identified complications.  Plan: 1. Continue taking hydrochlorothiazide 25 mg once daily 2. Encouraged heart healthy diet and increasing exercise to 30 minutes most days of the week. 4. Check BP 1-2 x per week at home, keep log, and bring to clinic at next appointment. 5. Follow up 6 months.

## 2017-03-25 NOTE — Assessment & Plan Note (Signed)
Stabilization of symptoms.  Is now reconnected with urology office.  Pt has been seen recently by Michiel CowboyShannon McGowan.  Pt notes will run out of tamsulosin prior to his next urology visit.  Will provide refill today, but should transition prescriptions to urology.

## 2017-03-25 NOTE — Telephone Encounter (Signed)
Refill sent for generic only tamsulosin.  It is possible the medicare formulary has changed and we may need to change the drug to a different one in the same class.

## 2017-03-25 NOTE — Telephone Encounter (Signed)
Pt. Called requesting a generic refill on Flomax [facility] [place].  Pt have also requested a phone call back when prescription was called in [phone]

## 2017-03-27 ENCOUNTER — Ambulatory Visit: Payer: No Typology Code available for payment source

## 2017-03-27 NOTE — Therapy (Signed)
[facility] [address] [place], [place], [address] Phone: [phone]   Fax:  [phone]  Physical Therapy Treatment  Patient Details  Name: Carlos BustardWilbert Reynolds MRN: [id number] Date of Birth: [birth year] No Data Recorded  Encounter Date: 03/25/2017    Past Medical History:  Diagnosis Date  . Anxiety   . Arthritis   . Depression   . Heartburn   . HLD (hyperlipidemia)   . Hypertension   . Kidney stone     Past Surgical History:  Procedure Laterality Date  . NO PAST SURGERIES     verified with patient on 01/15/17    There were no vitals filed for this visit.  Subjective Assessment - 03/27/17 1605    Subjective  Pt is able to lift his arm up more and his shoulder pain has been better. Pt reports the pelvic pain was better after leaving last session but it returned. Pt has not been taking his prostate medication for one week due to expensive cost. Pt has communicated with his PCP about this and he has been rpescribed another medication.     Pertinent History  Pt recently signed up for disability. Pt would love to go back to work. Pt would like to return to working with [employer].     Patient Stated Goals  comfort , relief,          [facility] PT Assessment - 03/27/17 1605      Palpation   Spinal mobility  increased tightness along iliocostalis L, L deviation of T1-3 ( improved post Tx)     Palpation comment  no tenderness/ tensions at pelvic floor, suprapubic area B , and adductor attachments                   [facility] Adult PT Treatment/Exercise - 03/27/17 1606      Modalities   Modalities  -- neck and shoulders - moist heat 5 min      Manual Therapy   Manual therapy comments  medial glide of L GH joint,  STM iliocostalis,inferior mob scap spine,PA /medial mob T1-3             PT Education - 03/27/17 1606    Education provided  Yes    Education Details  HEP    Person(s) Educated  Patient    Methods  Explanation;Demonstration;Tactile cues;Verbal cues    Comprehension  Returned demonstration;Verbalized understanding          PT Long Term Goals - 03/18/17 1536      PT LONG TERM GOAL #1   Title  Pt will report decreased scrotum pain from 10/10 to < 5/10 in order to improve QOL     Time  4    Period  Weeks    Status  On-going      PT LONG TERM GOAL #2   Title  Pt will return to regular bowel movements 2x/ day instead of needing a laxative in order to restore pelvic floor function    Time  10    Period  Weeks    Status  On-going      PT LONG TERM GOAL #3   Title  Pt will report no tenderness/ and demo no tensions at ischiocavernosus B across 2 visits in order to progress to deep core exercises     Time  2    Period  Weeks    Status  Achieved      PT LONG TERM  GOAL #4   Title  Pt will decrease QUICK DASH score  from % to < % in order to restore L shoulder function to sleep in the same bed as his wife, pull doors, open jars, and lift grandchildren      Time  12    Period  Weeks    Status  On-going      PT LONG TERM GOAL #5   Title  Pt will decrease his PDI score from % to % in order to improve QOL related to scrotal pain    Time  12    Period  Weeks    Status  On-going      Additional Long Term Goals   Additional Long Term Goals  Yes      PT LONG TERM GOAL #6   Title  Pt will demo proper lifting mechanics with 40 lbs without shoulder/ pelvic pain in order to lift grandchildren     Time  12    Period  Weeks    Status  On-going      PT LONG TERM GOAL #7   Title  Pt will demo increased endurance with 6 min walk test from 1475 ft to > 1648ft in order to improve cardiovascular health and pelvic mobility to minimize relapse of pain    Time  8    Period  Weeks    Target Date  05/13/17            Plan - 03/27/17 1607    Clinical Impression Statement  Pt showed decreased pelvic floor mm tensions compared to last session. Pt achieved increased AROM with  shoulder abduction in upright position. Pt reported feelign achiness in his shoudler joint L post Tx but the grinding sound with pronation of forearm had resolved along with decreased mm tightness medial to L scapula. Pt continues to benefit from skilled PT.      Rehab Potential  Good    PT Frequency  2x / week    PT Duration  12 weeks    PT Treatment/Interventions  Manual lymph drainage;Neuromuscular re-education;Moist Heat;Therapeutic activities;Functional mobility training;Stair training;Gait training;Patient/family education;Passive range of motion;Manual techniques;Therapeutic exercise    Consulted and Agree with Plan of Care  Patient       Patient will benefit from skilled therapeutic intervention in order to improve the following deficits and impairments:  Decreased activity tolerance, Decreased mobility, Difficulty walking, Increased muscle spasms, Decreased strength, Pain, Decreased range of motion, Decreased safety awareness, Decreased coordination, Postural dysfunction, Improper body mechanics, Decreased endurance, Hypomobility  Visit Diagnosis: Sacrococcygeal disorders, not elsewhere classified  Leg length discrepancy  Chronic left shoulder pain  Myalgia     Problem List Patient Active Problem List   Diagnosis Date Noted  . Essential hypertension 03/25/2017  . Gastroesophageal reflux disease 03/25/2017  . Depression 01/21/2017  . Chronic left shoulder pain 01/21/2017  . Chronic pain syndrome 01/21/2017  . 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 Benign prostatic hyperplasia with incomplete bladder emptying 11/21/2016  . Erectile dysfunction 11/21/2016  . Current moderate episode of major depressive disorder without prior episode (HCC) 11/21/2016  . Eczema 11/21/2016    Mariane Masters ,PT, DPT, E-RYT  03/27/2017, 4:09 PM  [facility] MAIN [facility] SERVICES [address] [place], [place], [address] Phone: [phone]   Fax:   [phone]  Name: Carlos Reynolds MRN: [id number] Date of Birth: 09-Apr-1955

## 2017-03-27 NOTE — Patient Instructions (Signed)
Moving cane across diagonal while laying on back  10 reps

## 2017-04-01 ENCOUNTER — Ambulatory Visit: Payer: No Typology Code available for payment source | Attending: Urology | Admitting: Physical Therapy

## 2017-04-01 ENCOUNTER — Telehealth: Payer: Self-pay

## 2017-04-01 ENCOUNTER — Other Ambulatory Visit: Payer: Self-pay

## 2017-04-01 DIAGNOSIS — M533 Sacrococcygeal disorders, not elsewhere classified: Secondary | ICD-10-CM | POA: Diagnosis present

## 2017-04-01 DIAGNOSIS — M791 Myalgia, unspecified site: Secondary | ICD-10-CM | POA: Diagnosis present

## 2017-04-01 DIAGNOSIS — M25512 Pain in left shoulder: Secondary | ICD-10-CM | POA: Diagnosis present

## 2017-04-01 DIAGNOSIS — Z1211 Encounter for screening for malignant neoplasm of colon: Secondary | ICD-10-CM

## 2017-04-01 DIAGNOSIS — G8929 Other chronic pain: Secondary | ICD-10-CM

## 2017-04-01 DIAGNOSIS — M2170 Unequal limb length (acquired), unspecified site: Secondary | ICD-10-CM | POA: Diagnosis present

## 2017-04-01 NOTE — Patient Instructions (Signed)
FEET flat on floot   ____  Red band at ballmounds , pillow under knees,  Up and pinky toe out  30 reps each  For dorsiflexion   ______  Seated :  Bend R elbow, lean to the R to stretch L side body 10 reps    Inhale tall, exhale, turn L 45 deg shoudlers then head like changing lanes  10 reps

## 2017-04-01 NOTE — Telephone Encounter (Signed)
Gastroenterology Pre-Procedure Review  Request Date: 04/15/17 Requesting Physician: Dr. Tobi BastosAnna  PATIENT REVIEW QUESTIONS: The patient responded to the following health history questions as indicated:    1. Are you having any GI issues? no 2. Do you have a personal history of Polyps? no 3. Do you have a family history of Colon Cancer or Polyps? yes (colon cancer) 4. Diabetes Mellitus? no 5. Joint replacements in the past 12 months?no 6. Major health problems in the past 3 months?no 7. Any artificial heart valves, MVP, or defibrillator?no    MEDICATIONS & ALLERGIES:    Patient reports the following regarding taking any anticoagulation/antiplatelet therapy:   Plavix, Coumadin, Eliquis, Xarelto, Lovenox, Pradaxa, Brilinta, or Effient? no Aspirin? no  Patient confirms/reports the following medications:  Current Outpatient Medications  Medication Sig Dispense Refill  . diclofenac (VOLTAREN) 75 MG EC tablet Take 1 tablet (75 mg total) by mouth 2 (two) times daily. 180 tablet 1  . FLUoxetine (PROZAC) 20 MG capsule Take 1 capsule (20 mg total) by mouth daily. 90 capsule 1  . gabapentin (NEURONTIN) 300 MG capsule 300 mg qhs for 2 weeks then 300 mg BID (Patient taking differently: 300 mg 2 (two) times daily. 300 mg qhs for 2 weeks then 300 mg BID) 60 capsule 1  . hydrochlorothiazide (HYDRODIURIL) 25 MG tablet Take 1 tablet (25 mg total) by mouth daily. 90 tablet 1  . HYDROcodone-acetaminophen (NORCO/VICODIN) 5-325 MG tablet Take 1 tablet by mouth 2 (two) times daily as needed for moderate pain. 60 tablet 0  . omeprazole (PRILOSEC) 20 MG capsule TAKE 1 CAPSULE (20 MG) BY MOUTH ONCE DAILY 90 capsule 1  . tamsulosin (FLOMAX) 0.4 MG CAPS capsule Take 2 capsules (0.8 mg total) by mouth daily. 180 capsule 1   No current facility-administered medications for this visit.     Patient confirms/reports the following allergies:  Allergies  Allergen Reactions  . Shellfish Allergy Hives  . Tape    Brown, elastic tape used by physical therapist; skin breaks out  . Amlodipine Other (See Comments)    Possible hand swelling  . Lisinopril Other (See Comments)    Cough    No orders of the defined types were placed in this encounter.   AUTHORIZATION INFORMATION Primary Insurance: 1D#: Group #:  Secondary Insurance: 1D#: Group #:  SCHEDULE INFORMATION: Date: 04/15/17 Time: Location:[facility]

## 2017-04-03 NOTE — Therapy (Signed)
[facility] MAIN [facility] [address] [place], [place], [address] Phone: [phone]   Fax:  [phone]  Physical Therapy Treatment  Patient Details  Name: Carlos BustardWilbert Louth MRN: [id number] Date of Birth: 1955-07-04 No Data Recorded  Encounter Date: 04/01/2017    Past Medical History:  Diagnosis Date  . Anxiety   . Arthritis   . Depression   . Heartburn   . HLD (hyperlipidemia)   . Hypertension   . Kidney stone     Past Surgical History:  Procedure Laterality Date  . NO PAST SURGERIES     verified with patient on 01/15/17    There were no vitals filed for this visit.  Subjective Assessment - 04/03/17 1717    Subjective  Pt has been doing his shoulder exercises and it is gotten alot better. Pt is able to put away dishes into overhead cabinets but he is still not able to wash his neck and back. His pelvic pain is still aching which has decreased from a 10/10 to 7/10. Pt is back on his prostate medication.  He still reports a crunching in his L elbow when he tries to twist his forearm when hoding his phone or lifting a soda to drink. Pt has had his elbow pain (1992)  prior to his L shoulder (2006).      Pertinent History  Pt recently signed up for disability. Pt would love to go back to work. Pt would like to return to working with [employer].     Patient Stated Goals  comfort , relief,          [facility] PT Assessment - 04/03/17 0848      AROM   Overall AROM Comments  shoulder abd L 180 deg  standing,. but pain at ~90 deg in supine, less pain with slight thorax L rotation, posterior expansion faciliation       PROM   Overall PROM Comments  subtalar: B limited dorsiflexion. eversion  L > R = 0 deg ( post Tx: improved B 5 deg )       Strength   Overall Strength Comments  hand dynanometer: R 40 N x 3 trials, L 80 N x 3 trials  DF/eversion R 4/5, L 3+5  ( post Tx: L 4/5)       Palpation   Spinal mobility  limited intercostal  mobility anterior/lateral/ posterior T 5-12  with limited posterior /L rotation of thorax     Palpation comment  tenderness /tensions at anterior pelvic mm B, L > R                   [facility] Adult PT Treatment/Exercise - 04/03/17 0848      Exercises   Exercises  -- 6 min walk test       Manual Therapy   Manual therapy comments  AP mob at talus joint B, AP/PA mob/ Grade III at cuneiform. navicular, cuboid B to increase doflexion and eversion STM anterior/later/posterior intercostal T5-12 L-> L rotatio                  PT Long Term Goals - 04/01/17 1311      PT LONG TERM GOAL #1   Title  Pt will report decreased scrotum pain from 10/10 to < 5/10 in order to improve QOL     Time  4    Period  Weeks    Status  On-going      PT LONG  TERM GOAL #2   Title  Pt will return to regular bowel movements 2x/ day instead of needing a laxative in order to restore pelvic floor function    Time  10    Period  Weeks    Status  Achieved      PT LONG TERM GOAL #3   Title  Pt will report no tenderness/ and demo no tensions at ischiocavernosus B across 2 visits in order to progress to deep core exercises     Time  2    Period  Weeks    Status  Achieved      PT LONG TERM GOAL #4   Title  Pt will decrease QUICK DASH score  from 70% to > 75 % in order to restore L shoulder function to sleep in the same bed as his wife, pull doors, open jars, and lift grandchildren  (04/01/17: 70%)     Time  12    Period  Weeks    Status  On-going      PT LONG TERM GOAL #5   Title  Pt will decrease his PDI score from 80 % to <70% in order to improve QOL related to scrotal pain    Time  12    Period  Weeks    Status  On-going      Additional Long Term Goals   Additional Long Term Goals  Yes      PT LONG TERM GOAL #6   Title  Pt will demo proper lifting mechanics with 40 lbs without shoulder/ pelvic pain in order to lift grandchildren     Time  12    Period  Weeks    Status  On-going       PT LONG TERM GOAL #7   Title  Pt will demo increased endurance with 6 min walk test from 1475 ft to > 1648ft in order to improve cardiovascular health and pelvic mobility to minimize relapse of pain    Time  8    Period  Weeks    Status  On-going      PT LONG TERM GOAL #8   Title  Pt will improve hand grip strength in RUE from 40 N to > 60 N, ( L UE 80 N) in order to hold objects     Time  10    Period  Weeks    Status  New    Target Date  06/10/17            Plan - 04/03/17 1715    Clinical Impression Statement  Pt is progressing well as he reports able to put away dishes into overhead cabinets but he is still not able to wash his neck and back.  He also demonstrates more scapular stabilization and less forward head with no cuing. Pt demonstrated return of L shoulder abduction in upright position but pain in supine at ~90 deg. Pain decreased with manual Tx which promoted more L rotation of thorax, decreased tightness of intercostals mm.   Pt's pelvic pain has not improved since his relapse. Pt showed tightness of pelvic floor despite compliance to HEP. Further assess lower kinetic chain which showed limited ankle dorsiflexion and weakness in ankle eversion, both of which improved post Tx. Suspect addressing pt's leg length difference and feet mechanics will help yield longer lasting benefits to minimizing pelvic floor tightness and pelvic pain.  Pt continues to benefit from skilled PT.       Rehab Potential  Good    PT Frequency  2x / week    PT Duration  12 weeks    PT Treatment/Interventions  Manual lymph drainage;Neuromuscular re-education;Moist Heat;Therapeutic activities;Functional mobility training;Stair training;Gait training;Patient/family education;Passive range of motion;Manual techniques;Therapeutic exercise    Consulted and Agree with Plan of Care  Patient       Patient will benefit from skilled therapeutic intervention in order to improve the following deficits and  impairments:  Decreased activity tolerance, Decreased mobility, Difficulty walking, Increased muscle spasms, Decreased strength, Pain, Decreased range of motion, Decreased safety awareness, Decreased coordination, Postural dysfunction, Improper body mechanics, Decreased endurance, Hypomobility  Visit Diagnosis: Sacrococcygeal disorders, not elsewhere classified  Leg length discrepancy  Chronic left shoulder pain  Myalgia     Problem List Patient Active Problem List   Diagnosis Date Noted  . Essential hypertension 03/25/2017  . Gastroesophageal reflux disease 03/25/2017  . Depression 01/21/2017  . Chronic left shoulder pain 01/21/2017  . Chronic pain syndrome 01/21/2017  . 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 Benign prostatic hyperplasia with incomplete bladder emptying 11/21/2016  . Erectile dysfunction 11/21/2016  . Current moderate episode of major depressive disorder without prior episode (HCC) 11/21/2016  . Eczema 11/21/2016    Mariane Masters ,PT, DPT, E-RYT  04/03/2017, 5:18 PM  [facility] MAIN [facility] SERVICES [address] [place], [place], [address] Phone: [phone]   Fax:  [phone]  Name: Niki Cosman MRN: [id number] Date of Birth: 11-Jan-1956

## 2017-04-07 ENCOUNTER — Encounter: Payer: No Typology Code available for payment source | Admitting: Physical Therapy

## 2017-04-08 ENCOUNTER — Encounter: Payer: No Typology Code available for payment source | Admitting: Physical Therapy

## 2017-04-08 ENCOUNTER — Encounter: Payer: Self-pay | Admitting: Urology

## 2017-04-08 ENCOUNTER — Ambulatory Visit: Payer: No Typology Code available for payment source | Admitting: Physical Therapy

## 2017-04-15 ENCOUNTER — Ambulatory Visit: Payer: Self-pay | Admitting: Anesthesiology

## 2017-04-15 ENCOUNTER — Ambulatory Visit
Admission: RE | Admit: 2017-04-15 | Discharge: 2017-04-15 | Disposition: A | Discharge status: Home / Self Care | Payer: Self-pay | Source: Ambulatory Visit | Attending: Gastroenterology | Admitting: Gastroenterology

## 2017-04-15 ENCOUNTER — Ambulatory Visit: Payer: Self-pay

## 2017-04-15 ENCOUNTER — Other Ambulatory Visit: Payer: Self-pay

## 2017-04-15 ENCOUNTER — Encounter: Payer: Self-pay | Admitting: *Deleted

## 2017-04-15 ENCOUNTER — Encounter
Payer: No Typology Code available for payment source | Admitting: Student in an Organized Health Care Education/Training Program

## 2017-04-15 ENCOUNTER — Encounter
Admission: RE | Disposition: A | Discharge status: Home / Self Care | Payer: Self-pay | Source: Ambulatory Visit | Attending: Gastroenterology

## 2017-04-15 DIAGNOSIS — E785 Hyperlipidemia, unspecified: Secondary | ICD-10-CM | POA: Insufficient documentation

## 2017-04-15 DIAGNOSIS — Z1211 Encounter for screening for malignant neoplasm of colon: Secondary | ICD-10-CM | POA: Insufficient documentation

## 2017-04-15 DIAGNOSIS — Z87442 Personal history of urinary calculi: Secondary | ICD-10-CM | POA: Insufficient documentation

## 2017-04-15 DIAGNOSIS — Z79899 Other long term (current) drug therapy: Secondary | ICD-10-CM | POA: Insufficient documentation

## 2017-04-15 DIAGNOSIS — I10 Essential (primary) hypertension: Secondary | ICD-10-CM | POA: Insufficient documentation

## 2017-04-15 DIAGNOSIS — F329 Major depressive disorder, single episode, unspecified: Secondary | ICD-10-CM | POA: Insufficient documentation

## 2017-04-15 DIAGNOSIS — F419 Anxiety disorder, unspecified: Secondary | ICD-10-CM | POA: Insufficient documentation

## 2017-04-15 DIAGNOSIS — Z86010 Personal history of colonic polyps: Secondary | ICD-10-CM | POA: Insufficient documentation

## 2017-04-15 DIAGNOSIS — F17210 Nicotine dependence, cigarettes, uncomplicated: Secondary | ICD-10-CM | POA: Insufficient documentation

## 2017-04-15 HISTORY — PX: COLONOSCOPY WITH PROPOFOL: SHX5780

## 2017-04-15 SURGERY — COLONOSCOPY WITH PROPOFOL
Anesthesia: General

## 2017-04-15 MED ORDER — PROPOFOL 10 MG/ML IV BOLUS
INTRAVENOUS | Status: DC | PRN
  Administered 2017-04-15 (×2): 150 mg via INTRAVENOUS

## 2017-04-15 MED ORDER — PROPOFOL 500 MG/50ML IV EMUL
INTRAVENOUS | Status: DC | PRN
  Administered 2017-04-15: 100 ug/kg/min via INTRAVENOUS

## 2017-04-15 MED ORDER — SODIUM CHLORIDE 0.9 % IV SOLN
INTRAVENOUS | Status: DC
  Administered 2017-04-15 (×2): via INTRAVENOUS

## 2017-04-15 MED ORDER — PROPOFOL 10 MG/ML IV BOLUS
INTRAVENOUS | Status: AC
  Filled 2017-04-15: qty 40

## 2017-04-15 NOTE — Op Note (Signed)
[facility] Gastroenterology Patient Name: Carlos Reynolds Procedure Date: 04/15/2017 10:24 AM MRN: [id number] Account #: [id number] Date of Birth: January 07, 1956 Admit Type: Outpatient Age: 62 Room: [facility] ENDO ROOM 1 Gender: Male Note Status: Finalized Procedure:            Colonoscopy Indications:          High risk colon cancer surveillance: Personal history                        of colonic polyps Providers:            Wyline Mood MD, MD Referring MD:         No Local Md, MD (Referring MD) Medicines:            Monitored Anesthesia Care Complications:        No immediate complications. Procedure:            Pre-Anesthesia Assessment:                       - Prior to the procedure, a History and Physical was                        performed, and patient medications, allergies and                        sensitivities were reviewed. The patient's tolerance of                        previous anesthesia was reviewed.                       - The risks and benefits of the procedure and the                        sedation options and risks were discussed with the                        patient. All questions were answered and informed                        consent was obtained.                       - ASA Grade Assessment: III - A patient with severe                        systemic disease.                       After obtaining informed consent, the colonoscope was                        passed under direct vision. Throughout the procedure,                        the patient's blood pressure, pulse, and oxygen                        saturations were monitored continuously. The  Colonoscope was introduced through the anus and                        advanced to the the cecum, identified by the                        appendiceal orifice, IC valve and transillumination.                        The colonoscopy was performed with ease. The patient                tolerated the procedure well. The quality of the bowel                        preparation was good. Findings:      The perianal and digital rectal examinations were normal.      The entire examined colon appeared normal on direct and retroflexion       views. Impression:           - The entire examined colon is normal on direct and                        retroflexion views.                       - No specimens collected. Recommendation:       - Discharge patient to home (with escort).                       - Resume previous diet.                       - Continue present medications.                       - Repeat colonoscopy in 5 years for surveillance. Procedure Code(s):    --- Professional ---                       [phone], Colonoscopy, flexible; diagnostic, including                        collection of specimen(s) by brushing or washing, when                        performed (separate procedure) Diagnosis Code(s):    --- Professional ---                       Z86.010, Personal history of colonic polyps CPT copyright 2016 American Medical Association. All rights reserved. The codes documented in this report are preliminary and upon coder review may  be revised to meet current compliance requirements. Wyline MoodKiran Gustavus Haskin, MD Wyline MoodKiran Barbara Ahart MD, MD 04/15/2017 10:44:51 AM This report has been signed electronically. Number of Addenda: 0 Note Initiated On: 04/15/2017 10:24 AM Scope Withdrawal Time: 0 hours 10 minutes 46 seconds  Total Procedure Duration: 0 hours 11 minutes 57 seconds       [facility]

## 2017-04-15 NOTE — H&P (Signed)
Wyline MoodKiran Clela Hagadorn, MD [address], [place], [place], [address] [address], [place], [place], [address] Phone: [phone]  Fax: [phone]  Primary Care Physician:  Galen ManilaKennedy, Lauren Renee, NP   Pre-Procedure History & Physical: HPI:  Carlos Reynolds is a 62 y.o. male is here for an colonoscopy.   Past Medical History:  Diagnosis Date  . Anxiety   . Arthritis   . Depression   . Heartburn   . HLD (hyperlipidemia)   . Hypertension   . Kidney stone     Past Surgical History:  Procedure Laterality Date  . COLONOSCOPY    . NO PAST SURGERIES     verified with patient on 01/15/17    Prior to Admission medications   Medication Sig Start Date End Date Taking? Authorizing Provider  diclofenac (VOLTAREN) 75 MG EC tablet Take 1 tablet (75 mg total) by mouth 2 (two) times daily. 03/18/17  Yes Galen ManilaKennedy, Lauren Renee, NP  FLUoxetine (PROZAC) 20 MG capsule Take 1 capsule (20 mg total) by mouth daily. 03/18/17  Yes Galen ManilaKennedy, Lauren Renee, NP  gabapentin (NEURONTIN) 300 MG capsule 300 mg qhs for 2 weeks then 300 mg BID Patient taking differently: 300 mg 2 (two) times daily. 300 mg qhs for 2 weeks then 300 mg BID 01/21/17  Yes Lateef, Roselie SkinnerBilal, MD  hydrochlorothiazide (HYDRODIURIL) 25 MG tablet Take 1 tablet (25 mg total) by mouth daily. 03/18/17  Yes Galen ManilaKennedy, Lauren Renee, NP  HYDROcodone-acetaminophen (NORCO/VICODIN) 5-325 MG tablet Take 1 tablet by mouth 2 (two) times daily as needed for moderate pain. 03/18/17  Yes Edward JollyLateef, Bilal, MD  omeprazole (PRILOSEC) 20 MG capsule TAKE 1 CAPSULE (20 MG) BY MOUTH ONCE DAILY 03/18/17  Yes Galen ManilaKennedy, Lauren Renee, NP  tamsulosin (FLOMAX) 0.4 MG CAPS capsule Take 2 capsules (0.8 mg total) by mouth daily. 03/25/17  Yes Galen ManilaKennedy, Lauren Renee, NP    Allergies as of 04/01/2017 - Review Complete 03/25/2017  Allergen Reaction Noted  . Shellfish allergy Hives 11/18/2016  . Tape  03/18/2017  . Amlodipine Other (See Comments) 10/05/2014  . Lisinopril  Other (See Comments) 09/29/2014    Family History  Problem Relation Age of Onset  . Heart disease Mother   . Colon cancer Mother   . Diabetes Father   . Prostate cancer Father   . Colon cancer Brother   . Heart disease Brother   . Kidney cancer Neg Hx   . Bladder Cancer Neg Hx     Social History   Socioeconomic History  . Marital status: Married    Spouse name: Not on file  . Number of children: Not on file  . Years of education: Not on file  . Highest education level: Not on file  Social Needs  . Financial resource strain: Not on file  . Food insecurity - worry: Not on file  . Food insecurity - inability: Not on file  . Transportation needs - medical: Not on file  . Transportation needs - non-medical: Not on file  Occupational History  . Not on file  Tobacco Use  . Smoking status: Current Every Day Smoker    Packs/day: 0.50    Years: 46.00    Pack years: 23.00    Types: Cigarettes  . Smokeless tobacco: Never Used  Substance and Sexual Activity  . Alcohol use: Yes    Alcohol/week: 8.4 oz    Types: 14 Shots of liquor per week    Comment: 2 drinks of Liquor daily  . Drug use:  No  . Sexual activity: Not Currently  Other Topics Concern  . Not on file  Social History Narrative  . Not on file    Review of Systems: See HPI, otherwise negative ROS  Physical Exam: BP (!) 142/75   Pulse 67   Temp (!) 96.9 F (36.1 C) (Tympanic)   Resp 18   Ht 5\' 10"  (1.778 m)   Wt 217 lb (98.4 kg)   SpO2 98%   BMI 31.14 kg/m  General:   Alert,  pleasant and cooperative in NAD Head:  Normocephalic and atraumatic. Neck:  Supple; no masses or thyromegaly. Lungs:  Clear throughout to auscultation, normal respiratory effort.    Heart:  +S1, +S2, Regular rate and rhythm, No edema. Abdomen:  Soft, nontender and nondistended. Normal bowel sounds, without guarding, and without rebound.   Neurologic:  Alert and  oriented x4;  grossly normal  neurologically.  Impression/Plan: Carlos Reynolds is here for an colonoscopy to be performed for surveillance due to prior history of colon polyps   Risks, benefits, limitations, and alternatives regarding  colonoscopy have been reviewed with the patient.  Questions have been answered.  All parties agreeable.   Wyline Mood, MD  04/15/2017, 10:28 AM

## 2017-04-15 NOTE — Anesthesia Postprocedure Evaluation (Signed)
Anesthesia Post Note  Patient: Carlos Reynolds  Procedure(s) Performed: COLONOSCOPY WITH PROPOFOL (N/A )  Patient location during evaluation: PACU Anesthesia Type: General Level of consciousness: awake and alert Pain management: pain level controlled Vital Signs Assessment: post-procedure vital signs reviewed and stable Respiratory status: spontaneous breathing, nonlabored ventilation, respiratory function stable and patient connected to nasal cannula oxygen Cardiovascular status: blood pressure returned to baseline and stable Postop Assessment: no apparent nausea or vomiting Anesthetic complications: no     Last Vitals:  Vitals:   04/15/17 1100 04/15/17 1110  BP: (!) 130/99 124/89  Pulse: 68 64  Resp: 17 (!) 23  Temp:    SpO2: 97% 99%    Last Pain:  Vitals:   04/15/17 1049  TempSrc: Tympanic                 Yevette EdwardsJames G Adams

## 2017-04-15 NOTE — Anesthesia Post-op Follow-up Note (Signed)
Anesthesia QCDR form completed.        

## 2017-04-15 NOTE — Transfer of Care (Signed)
Immediate Anesthesia Transfer of Care Note  Patient: Carlos BustardWilbert Brabant  Procedure(s) Performed: COLONOSCOPY WITH PROPOFOL (N/A )  Patient Location: PACU  Anesthesia Type:General  Level of Consciousness: awake and alert   Airway & Oxygen Therapy: Patient Spontanous Breathing and Patient connected to nasal cannula oxygen  Post-op Assessment: Report given to RN  Post vital signs: Reviewed and stable  Last Vitals:  Vitals:   04/15/17 1001 04/15/17 1049  BP: (!) 142/75 102/77  Pulse: 67 71  Resp: 18 (!) 22  Temp: (!) 36.1 C (!) 36.2 C  SpO2: 98% 100%    Last Pain:  Vitals:   04/15/17 1049  TempSrc: Tympanic      Patients Stated Pain Goal: 6 (04/15/17 1001)  Complications: No apparent anesthesia complications

## 2017-04-15 NOTE — Anesthesia Preprocedure Evaluation (Signed)
Anesthesia Evaluation  Patient identified by MRN, date of birth, ID band Patient awake    Reviewed: Allergy & Precautions, H&P , NPO status , Patient's Chart, lab work & pertinent test results, reviewed documented beta blocker date and time   Airway Mallampati: II   Neck ROM: full    Dental  (+) Poor Dentition   Pulmonary neg pulmonary ROS, Current Smoker,    Pulmonary exam normal        Cardiovascular Exercise Tolerance: Good hypertension, On Medications negative cardio ROS Normal cardiovascular exam Rhythm:regular Rate:Normal     Neuro/Psych PSYCHIATRIC DISORDERS negative neurological ROS  negative psych ROS   GI/Hepatic negative GI ROS, Neg liver ROS, GERD  Medicated,  Endo/Other  negative endocrine ROS  Renal/GU Renal diseasenegative Renal ROS  negative genitourinary   Musculoskeletal   Abdominal   Peds  Hematology negative hematology ROS (+)   Anesthesia Other Findings Past Medical History: No date: Anxiety No date: Arthritis No date: Depression No date: Heartburn No date: HLD (hyperlipidemia) No date: Hypertension No date: Kidney stone Past Surgical History: No date: NO PAST SURGERIES     Comment:  verified with patient on 01/15/17   Reproductive/Obstetrics negative OB ROS                             Anesthesia Physical Anesthesia Plan  ASA: II  Anesthesia Plan: General   Post-op Pain Management:    Induction:   PONV Risk Score and Plan:   Airway Management Planned:   Additional Equipment:   Intra-op Plan:   Post-operative Plan:   Informed Consent: I have reviewed the patients History and Physical, chart, labs and discussed the procedure including the risks, benefits and alternatives for the proposed anesthesia with the patient or authorized representative who has indicated his/her understanding and acceptance.   Dental Advisory Given  Plan Discussed with:  CRNA  Anesthesia Plan Comments:         Anesthesia Quick Evaluation

## 2017-04-17 ENCOUNTER — Ambulatory Visit
Payer: No Typology Code available for payment source | Attending: Student in an Organized Health Care Education/Training Program | Admitting: Student in an Organized Health Care Education/Training Program

## 2017-04-17 ENCOUNTER — Ambulatory Visit (INDEPENDENT_AMBULATORY_CARE_PROVIDER_SITE_OTHER): Payer: No Typology Code available for payment source

## 2017-04-17 ENCOUNTER — Telehealth: Payer: Self-pay | Admitting: Urology

## 2017-04-17 ENCOUNTER — Encounter: Payer: Self-pay | Admitting: Student in an Organized Health Care Education/Training Program

## 2017-04-17 ENCOUNTER — Other Ambulatory Visit: Payer: Self-pay

## 2017-04-17 ENCOUNTER — Ambulatory Visit: Payer: No Typology Code available for payment source | Admitting: Physical Therapy

## 2017-04-17 VITALS — BP 138/88 | HR 78 | Ht 70.0 in | Wt 217.0 lb

## 2017-04-17 VITALS — BP 143/92 | HR 64 | Temp 98.0°F | Resp 18 | Ht 70.0 in | Wt 217.0 lb

## 2017-04-17 DIAGNOSIS — M25512 Pain in left shoulder: Secondary | ICD-10-CM | POA: Insufficient documentation

## 2017-04-17 DIAGNOSIS — F17210 Nicotine dependence, cigarettes, uncomplicated: Secondary | ICD-10-CM | POA: Insufficient documentation

## 2017-04-17 DIAGNOSIS — M533 Sacrococcygeal disorders, not elsewhere classified: Secondary | ICD-10-CM

## 2017-04-17 DIAGNOSIS — M19012 Primary osteoarthritis, left shoulder: Secondary | ICD-10-CM | POA: Insufficient documentation

## 2017-04-17 DIAGNOSIS — G8929 Other chronic pain: Secondary | ICD-10-CM

## 2017-04-17 DIAGNOSIS — N503 Cyst of epididymis: Secondary | ICD-10-CM | POA: Insufficient documentation

## 2017-04-17 DIAGNOSIS — M79602 Pain in left arm: Secondary | ICD-10-CM | POA: Insufficient documentation

## 2017-04-17 DIAGNOSIS — F329 Major depressive disorder, single episode, unspecified: Secondary | ICD-10-CM | POA: Insufficient documentation

## 2017-04-17 DIAGNOSIS — G894 Chronic pain syndrome: Secondary | ICD-10-CM | POA: Insufficient documentation

## 2017-04-17 DIAGNOSIS — N401 Enlarged prostate with lower urinary tract symptoms: Secondary | ICD-10-CM | POA: Insufficient documentation

## 2017-04-17 DIAGNOSIS — I861 Scrotal varices: Secondary | ICD-10-CM | POA: Insufficient documentation

## 2017-04-17 DIAGNOSIS — I10 Essential (primary) hypertension: Secondary | ICD-10-CM | POA: Insufficient documentation

## 2017-04-17 DIAGNOSIS — N5082 Scrotal pain: Secondary | ICD-10-CM | POA: Insufficient documentation

## 2017-04-17 DIAGNOSIS — E785 Hyperlipidemia, unspecified: Secondary | ICD-10-CM | POA: Insufficient documentation

## 2017-04-17 DIAGNOSIS — M791 Myalgia, unspecified site: Secondary | ICD-10-CM

## 2017-04-17 DIAGNOSIS — Z87442 Personal history of urinary calculi: Secondary | ICD-10-CM | POA: Insufficient documentation

## 2017-04-17 DIAGNOSIS — N400 Enlarged prostate without lower urinary tract symptoms: Secondary | ICD-10-CM | POA: Insufficient documentation

## 2017-04-17 DIAGNOSIS — R300 Dysuria: Secondary | ICD-10-CM

## 2017-04-17 DIAGNOSIS — N529 Male erectile dysfunction, unspecified: Secondary | ICD-10-CM | POA: Insufficient documentation

## 2017-04-17 DIAGNOSIS — F32A Depression, unspecified: Secondary | ICD-10-CM

## 2017-04-17 DIAGNOSIS — Z888 Allergy status to other drugs, medicaments and biological substances status: Secondary | ICD-10-CM | POA: Insufficient documentation

## 2017-04-17 DIAGNOSIS — M2170 Unequal limb length (acquired), unspecified site: Secondary | ICD-10-CM

## 2017-04-17 DIAGNOSIS — F419 Anxiety disorder, unspecified: Secondary | ICD-10-CM | POA: Insufficient documentation

## 2017-04-17 DIAGNOSIS — Z79899 Other long term (current) drug therapy: Secondary | ICD-10-CM | POA: Insufficient documentation

## 2017-04-17 DIAGNOSIS — K219 Gastro-esophageal reflux disease without esophagitis: Secondary | ICD-10-CM | POA: Insufficient documentation

## 2017-04-17 LAB — URINALYSIS, COMPLETE
Bilirubin, UA: NEGATIVE
Glucose, UA: NEGATIVE
Ketones, UA: NEGATIVE
LEUKOCYTES UA: NEGATIVE
Nitrite, UA: NEGATIVE
PH UA: 5.5 (ref 5.0–7.5)
PROTEIN UA: NEGATIVE
Urobilinogen, Ur: 0.2 mg/dL (ref 0.2–1.0)

## 2017-04-17 LAB — MICROSCOPIC EXAMINATION

## 2017-04-17 MED ORDER — HYDROCODONE-ACETAMINOPHEN 7.5-325 MG PO TABS: 1.0000 | ORAL_TABLET | Freq: Two times a day (BID) | ORAL | 0 refills | Status: DC | PRN

## 2017-04-17 NOTE — Telephone Encounter (Signed)
If patient is having gross hematuria, he needs to come in for an evaluation.  Blood in the urine can be a sign of kidney or bladder cancer.

## 2017-04-17 NOTE — Progress Notes (Signed)
Nursing Pain Medication Assessment:  Safety precautions to be maintained throughout the outpatient stay will include: orient to surroundings, keep bed in low position, maintain call bell within reach at all times, provide assistance with transfer out of bed and ambulation.  Medication Inspection Compliance: Mr. Chavers did not comply with our request to bring his pills to be counted. He was reminded that bringing the medication bottles, even when empty, is a requirement.  Medication: None brought in. Pill/Patch Count: None available to be counted. Bottle Appearance: No container available. Did not bring bottle(s) to appointment. Filled Date: N/A Last Medication intake:  Today 

## 2017-04-17 NOTE — Progress Notes (Signed)
Pt presents today with c/o dysuria, blood in urine, back pain, and night sweats. A clean catch was obtained for u/a and cx.  Blood pressure 138/88, pulse 78, height 5\' 10"  (1.778 m), weight 217 lb (98.4 kg).

## 2017-04-17 NOTE — Progress Notes (Signed)
Patient's Name: Carlos Reynolds  MRN: [id number]  Referring Provider: Mikey College, *  DOB: 1955/03/14  PCP: Mikey College, NP  DOS: 04/17/2017  Note by: Gillis Santa, MD  Service setting: Ambulatory outpatient  Specialty: Interventional Pain Management  Location: [facility] (AMB) Pain Management Facility    Patient type: Established   Primary Reason(s) for Visit: Encounter for prescription drug management. (Level of risk: moderate)  CC: Shoulder Pain (left) and Arm Pain (left)  HPI  Mr. Carlos Reynolds is a 62 y.o. year old, male patient, who comes today for a medication management evaluation. He has Osteoarthritis of left shoulder; Benign prostatic hyperplasia with incomplete bladder emptying; Erectile dysfunction; Current moderate episode of major depressive disorder without prior episode ([place]); Eczema; Depression; Chronic left shoulder pain; Chronic pain syndrome; Essential hypertension; and Gastroesophageal reflux disease on their problem list. His primarily concern today is the Shoulder Pain (left) and Arm Pain (left)  Pain Assessment: Location: Left Shoulder Radiating: left arm Onset: More than a month ago Duration: Chronic pain Quality: Constant, Aching Severity: 8 /10 (self-reported pain score)  Note: Reported level is inconsistent with clinical observations. Clinically the patient looks like a 3/10 A 3/10 is viewed as "Moderate" and described as significantly interfering with activities of daily living (ADL). It becomes difficult to feed, bathe, get dressed, get on and off the toilet or to perform personal hygiene functions. Difficult to get in and out of bed or a chair without assistance. Very distracting. With effort, it can be ignored when deeply involved in activities.       When using our objective Pain Scale, levels between 6 and 10/10 are said to belong in an emergency room, as it progressively worsens from a 6/10, described as severely limiting, requiring emergency care not  usually available at an outpatient pain management facility. At a 6/10 level, communication becomes difficult and requires great effort. Assistance to reach the emergency department may be required. Facial flushing and profuse sweating along with potentially dangerous increases in heart rate and blood pressure will be evident. Effect on ADL:   Timing: Constant Modifying factors: medications, heat, PT  Mr. Carlos Reynolds was last scheduled for an appointment on 03/18/2017 for medication management. During today's appointment we reviewed [name]'s chronic pain status, as well as his outpatient medication regimen.  Patient was unable to get his left shoulder MRI performed secondary to insurance issues.  He states that his insurance is now in place and that he can get his left shoulder MRI.  He does find benefit with hydrocodone 5 mg twice daily.  Patient is endorsing moderate relief and is having pain 3-4 hours after his dose.  The patient  reports that he does not use drugs. His body mass index is 31.14 kg/m.  Further details on both, my assessment(s), as well as the proposed treatment plan, please see below.  Controlled Substance Pharmacotherapy Assessment REMS (Risk Evaluation and Mitigation Strategy)  Analgesic: Hydrocodone 5 mill grams twice daily as needed, quantity 37-monthMME/day: 10 mg/day.  SHart Rochester RN  04/17/2017 11:42 AM  Sign at close encounter Nursing Pain Medication Assessment:  Safety precautions to be maintained throughout the outpatient stay will include: orient to surroundings, keep bed in low position, maintain call bell within reach at all times, provide assistance with transfer out of bed and ambulation.  Medication Inspection Compliance: Mr. SFeradid not comply with our request to bring his pills to be counted. He was reminded that bringing the medication bottles, even when  empty, is a requirement.  Medication: None brought in. Pill/Patch Count: None available to  be counted. Bottle Appearance: No container available. Did not bring bottle(s) to appointment. Filled Date: N/A Last Medication intake:  Today Pharmacokinetics: Liberation and absorption (onset of action): WNL Distribution (time to peak effect): WNL Metabolism and excretion (duration of action): WNL         Pharmacodynamics: Desired effects: Analgesia: Mr. Carlos Reynolds reports >50% benefit. Functional ability: Patient reports that medication allows him to accomplish basic ADLs Clinically meaningful improvement in function (CMIF): Sustained CMIF goals met Perceived effectiveness: Described as relatively effective but with some room for improvement Undesirable effects: Side-effects or Adverse reactions: None reported Monitoring: [place] PMP: Online review of the past 25-monthperiod conducted. Compliant with practice rules and regulations Last UDS on record: Summary  Date Value Ref Range Status  01/21/2017 FINAL  Final    Comment:    ==================================================================== TOXASSURE COMP DRUG ANALYSIS,UR ==================================================================== Test                             Result       Flag       Units Drug Present and Declared for Prescription Verification   Fluoxetine                     PRESENT      EXPECTED   Norfluoxetine                  PRESENT      EXPECTED    Norfluoxetine is an expected metabolite of fluoxetine. Drug Absent but Declared for Prescription Verification   Gabapentin                     Not Detected UNEXPECTED   Diclofenac                     Not Detected UNEXPECTED    Diclofenac, as indicated in the declared medication list, is not    always detected even when used as directed. ==================================================================== Test                      Result    Flag   Units      Ref Range   Creatinine              57               mg/dL       >=20 ==================================================================== Declared Medications:  The flagging and interpretation on this report are based on the  following declared medications.  Unexpected results may arise from  inaccuracies in the declared medications.  **Note: The testing scope of this panel includes these medications:  Fluoxetine  Gabapentin  **Note: The testing scope of this panel does not include small to  moderate amounts of these reported medications:  Diclofenac  **Note: The testing scope of this panel does not include following  reported medications:  Hydrochlorothiazide  Omeprazole  Sildenafil  Tamsulosin ==================================================================== For clinical consultation, please call [phone]====    UDS interpretation: Compliant          Medication Assessment Form: Reviewed. Patient indicates being compliant with therapy Treatment compliance: Compliant Risk Assessment Profile: Aberrant behavior: See prior evaluations. None observed or detected today Comorbid factors increasing risk of overdose: See prior notes. No additional risks detected today Risk of substance use disorder (SUD):  Low Opioid Risk Tool - 03/18/17 0846      Family History of Substance Abuse   Alcohol  Negative    Illegal Drugs  Negative    Rx Drugs  Negative      Personal History of Substance Abuse   Alcohol  Negative    Illegal Drugs  Positive Male or Male    Rx Drugs  Negative      Age   Age between 32-45 years   No      History of Preadolescent Sexual Abuse   History of Preadolescent Sexual Abuse  Negative or Male      Psychological Disease   Psychological Disease  Negative    Depression  Positive      Total Score   Opioid Risk Tool Scoring  5    Opioid Risk Interpretation  Moderate Risk      ORT Scoring interpretation table:  Score <3 = Low Risk for SUD  Score  between 4-7 = Moderate Risk for SUD  Score >8 = High Risk for Opioid Abuse   Risk Mitigation Strategies:  Patient Counseling: Covered Patient-Prescriber Agreement (PPA): Present and active  Notification to other healthcare providers: Done  Pharmacologic Plan: Increase hydrocodone to 7.5 mg twice daily as needed, quantity 41-month             Laboratory Chemistry  Inflammation Markers (CRP: Acute Phase) (ESR: Chronic Phase) No results found for: CRP, ESRSEDRATE, LATICACIDVEN                       Rheumatology Markers No results found for: [facility]             Renal Function Markers Lab Results  Component Value Date   BUN 17 08/12/2016   CREATININE 1.04 08/12/2016   GFRAA >60 08/12/2016   GFRNONAA >60 08/12/2016                 Hepatic Function Markers Lab Results  Component Value Date   AST 21 08/12/2016   ALT 23 08/12/2016   ALBUMIN 3.8 08/12/2016   ALKPHOS 57 08/12/2016                 Electrolytes Lab Results  Component Value Date   NA 136 08/12/2016   K 3.7 08/12/2016   CL 103 08/12/2016   CALCIUM 8.9 08/12/2016                        Neuropathy Markers No results found for: VITAMINB12, FOLATE, HGBA1C, HIV               Bone Pathology Markers Lab Results  Component Value Date   TESTOSTERONE 439 12/04/2016                         Coagulation Parameters Lab Results  Component Value Date   PLT 282 08/12/2016                 Cardiovascular Markers Lab Results  Component Value Date   HGB 14.9 08/12/2016   HCT 43.6 08/12/2016                 CA Markers No results found for: CEA, CA125, LABCA2               Note: Lab results reviewed.  Recent Diagnostic Imaging Results  [place] CLINICAL DATA:  Initial  evaluation for scrotal pain, epididymal cyst.  EXAM: SCROTAL ULTRASOUND  DOPPLER ULTRASOUND OF THE TESTICLES  TECHNIQUE: Complete ultrasound examination of the testicles, epididymis, and other  scrotal structures was performed. Color and spectral Doppler ultrasound were also utilized to evaluate blood flow to the testicles.  COMPARISON:  None.  FINDINGS: Right testicle  Measurements: 4.6 x 3.6 x 3.4 cm. No mass or microlithiasis visualized.  Left testicle  Measurements: 4.4 x 2.9 x 3.9 cm. No mass or microlithiasis visualized.  Right epididymis:  Normal in size and appearance.  Left epididymis: 8 mm exophytic mildly heterogeneous left epididymal cyst. Left epididymis otherwise normal size in appearance.  Hydrocele:  None visualized.  Varicocele:  Small left varicocele.  Pulsed Doppler interrogation of both testes demonstrates normal low resistance arterial and venous waveforms bilaterally.  IMPRESSION: 1. 8 mm mildly heterogeneous left epididymal cyst. 2. Small left varicocele. 3. Otherwise normal testicular ultrasound.  Electronically Signed   By: Jeannine Boga M.D.   On: 01/21/2017 21:53 US PELVIC DOPPLER (TORSION R/O OR MASS ARTERIAL FLOW) CLINICAL DATA:  Initial evaluation for scrotal pain, epididymal cyst.  EXAM: SCROTAL ULTRASOUND  DOPPLER ULTRASOUND OF THE TESTICLES  TECHNIQUE: Complete ultrasound examination of the testicles, epididymis, and other scrotal structures was performed. Color and spectral Doppler ultrasound were also utilized to evaluate blood flow to the testicles.  COMPARISON:  None.  FINDINGS: Right testicle  Measurements: 4.6 x 3.6 x 3.4 cm. No mass or microlithiasis visualized.  Left testicle  Measurements: 4.4 x 2.9 x 3.9 cm. No mass or microlithiasis visualized.  Right epididymis:  Normal in size and appearance.  Left epididymis: 8 mm exophytic mildly heterogeneous left epididymal cyst. Left epididymis otherwise normal size in appearance.  Hydrocele:  None visualized.  Varicocele:  Small left varicocele.  Pulsed Doppler interrogation of both testes demonstrates normal low resistance arterial and  venous waveforms bilaterally.  IMPRESSION: 1. 8 mm mildly heterogeneous left epididymal cyst. 2. Small left varicocele. 3. Otherwise normal testicular ultrasound.  Electronically Signed   By: Jeannine Boga M.D.   On: 01/21/2017 21:53  Complexity Note: Imaging results reviewed. Results shared with Mr. Burandt, using Layman's terms.                         Meds   Current Outpatient Medications:  .  diclofenac (VOLTAREN) 75 MG EC tablet, Take 1 tablet (75 mg total) by mouth 2 (two) times daily., Disp: 180 tablet, Rfl: 1 .  FLUoxetine (PROZAC) 20 MG capsule, Take 1 capsule (20 mg total) by mouth daily., Disp: 90 capsule, Rfl: 1 .  gabapentin (NEURONTIN) 300 MG capsule, 300 mg qhs for 2 weeks then 300 mg BID (Patient taking differently: 300 mg 2 (two) times daily. 300 mg qhs for 2 weeks then 300 mg BID), Disp: 60 capsule, Rfl: 1 .  hydrochlorothiazide (HYDRODIURIL) 25 MG tablet, Take 1 tablet (25 mg total) by mouth daily., Disp: 90 tablet, Rfl: 1 .  omeprazole (PRILOSEC) 20 MG capsule, TAKE 1 CAPSULE (20 MG) BY MOUTH ONCE DAILY, Disp: 90 capsule, Rfl: 1 .  tamsulosin (FLOMAX) 0.4 MG CAPS capsule, Take 2 capsules (0.8 mg total) by mouth daily., Disp: 180 capsule, Rfl: 1 .  HYDROcodone-acetaminophen (NORCO) 7.5-325 MG tablet, Take 1 tablet by mouth 2 (two) times daily as needed for moderate pain., Disp: 60 tablet, Rfl: 0  ROS  Constitutional: Denies any fever or chills Gastrointestinal: No reported hemesis, hematochezia, vomiting, or acute GI distress Musculoskeletal: Denies  any acute onset joint swelling, redness, loss of ROM, or weakness Neurological: No reported episodes of acute onset apraxia, aphasia, dysarthria, agnosia, amnesia, paralysis, loss of coordination, or loss of consciousness  Allergies  Mr. Beck is allergic to shellfish allergy; tape; amlodipine; and lisinopril.  PFSH  Drug: Mr. Clapper  reports that he does not use drugs. Alcohol:  reports that he drinks about  8.4 oz of alcohol per week. Tobacco:  reports that he has been smoking cigarettes.  He has a 23.00 pack-year smoking history. he has never used smokeless tobacco. Medical:  has a past medical history of Anxiety, Arthritis, Depression, Heartburn, HLD (hyperlipidemia), Hypertension, and Kidney stone. Surgical: Mr. Sawatzky  has a past surgical history that includes No past surgeries; Colonoscopy; and Colonoscopy with propofol (N/A, 04/15/2017). Family: family history includes Colon cancer in his brother and mother; Diabetes in his father; Heart disease in his brother and mother; Prostate cancer in his father.  Constitutional Exam  General appearance: Well nourished, well developed, and well hydrated. In no apparent acute distress Vitals:   04/17/17 1143  BP: (!) 143/92  Pulse: 64  Resp: 18  Temp: 98 F (36.7 C)  TempSrc: Oral  SpO2: 99%  Weight: 217 lb (98.4 kg)  Height: 5' 10"  (1.778 m)   BMI Assessment: Estimated body mass index is 31.14 kg/m as calculated from the following:   Height as of this encounter: 5' 10"  (1.778 m).   Weight as of this encounter: 217 lb (98.4 kg).  BMI interpretation table: BMI level Category Range association with higher incidence of chronic pain  <18 kg/m2 Underweight   18.5-24.9 kg/m2 Ideal body weight   25-29.9 kg/m2 Overweight Increased incidence by 20%  30-34.9 kg/m2 Obese (Class I) Increased incidence by 68%  35-39.9 kg/m2 Severe obesity (Class II) Increased incidence by 136%  >40 kg/m2 Extreme obesity (Class III) Increased incidence by 254%   BMI Readings from Last 4 Encounters:  04/17/17 31.14 kg/m  04/17/17 31.14 kg/m  04/15/17 31.14 kg/m  03/18/17 32.43 kg/m   Wt Readings from Last 4 Encounters:  04/17/17 217 lb (98.4 kg)  04/17/17 217 lb (98.4 kg)  04/15/17 217 lb (98.4 kg)  03/18/17 226 lb (102.5 kg)  Psych/Mental status: Alert, oriented x 3 (person, place, & time)       Eyes: PERLA Respiratory: No evidence of acute respiratory  distress  Cervical Spine Area Exam  Skin & Axial Inspection: No masses, redness, edema, swelling, or associated skin lesions Alignment: Symmetrical Functional ROM: Unrestricted ROM      Stability: No instability detected Muscle Tone/Strength: Functionally intact. No obvious neuro-muscular anomalies detected. Sensory (Neurological): Unimpaired Palpation: No palpable anomalies                    Upper Extremity (UE) Exam    Side:Right upper extremity  Side:Left upper extremity   Skin & Extremity Inspection:Skin color, temperature, and hair growth are WNL. No peripheral edema or cyanosis. No masses, redness, swelling, asymmetry, or associated skin lesions. No contractures.  Skin & Extremity Inspection:Degenerative deforming arthropathy   Functional GQB:[id number] ROM  Functional MKL:KJZPHXTAV ROMlimited shoulder abduction. Unable to extend beyond 90 degrees,secondary to pain   Muscle Tone/Strength:Functionally intact. No obvious neuro-muscular anomalies detected.  Muscle Tone/Strength:Functionally intact. No obvious neuro-muscular anomalies detected.   Sensory (Neurological):Unimpaired  Sensory (Neurological):Arthropathic arthralgia   Palpation:No palpable anomalies  Palpation:Complains of area being tender to palpation   Specialized Test(s):Deferred  Specialized Test(s):Deferred     Thoracic Spine Area Exam  Skin & Axial Inspection: No masses, redness, or swelling Alignment: Symmetrical Functional ROM: Unrestricted ROM Stability: No instability detected Muscle Tone/Strength: Functionally intact. No obvious neuro-muscular anomalies detected. Sensory (Neurological): Unimpaired Muscle strength & Tone: No palpable anomalies  Lumbar Spine Area Exam  Skin & Axial Inspection: No masses, redness, or swelling Alignment: Symmetrical Functional ROM: Unrestricted ROM      Stability: No  instability detected Muscle Tone/Strength: Functionally intact. No obvious neuro-muscular anomalies detected. Sensory (Neurological): Unimpaired Palpation: No palpable anomalies       Provocative Tests: Lumbar Hyperextension and rotation test: evaluation deferred today       Lumbar Lateral bending test: evaluation deferred today       Patrick's Maneuver: evaluation deferred today                    Gait & Posture Assessment  Ambulation: Unassisted Gait: Relatively normal for age and body habitus Posture: WNL   Lower Extremity Exam    Side: Right lower extremity  Side: Left lower extremity  Skin & Extremity Inspection: Skin color, temperature, and hair growth are WNL. No peripheral edema or cyanosis. No masses, redness, swelling, asymmetry, or associated skin lesions. No contractures.  Skin & Extremity Inspection: Skin color, temperature, and hair growth are WNL. No peripheral edema or cyanosis. No masses, redness, swelling, asymmetry, or associated skin lesions. No contractures.  Functional ROM: Unrestricted ROM          Functional ROM: Unrestricted ROM          Muscle Tone/Strength: Functionally intact. No obvious neuro-muscular anomalies detected.  Muscle Tone/Strength: Functionally intact. No obvious neuro-muscular anomalies detected.  Sensory (Neurological): Unimpaired  Sensory (Neurological): Unimpaired  Palpation: No palpable anomalies  Palpation: No palpable anomalies   Assessment  Primary Diagnosis & Pertinent Problem List: The primary encounter diagnosis was Primary osteoarthritis of left shoulder. Diagnoses of Chronic left shoulder pain, Depression, unspecified depression type, and Chronic pain syndrome were also pertinent to this visit.  Status Diagnosis  Controlled Controlled Controlled 1. Primary osteoarthritis of left shoulder   2. Chronic left shoulder pain   3. Depression, unspecified depression type   4. Chronic pain syndrome     General Recommendations: The pain  condition that the patient suffers from is best treated with a multidisciplinary approach that involves an increase in physical activity to prevent de-conditioning and worsening of the pain cycle, as well as psychological counseling (formal and/or informal) to address the co-morbid psychological affects of pain. Treatment will often involve judicious use of pain medications and interventional procedures to decrease the pain, allowing the patient to participate in the physical activity that will ultimately produce long-lasting pain reductions. The goal of the multidisciplinary approach is to return the patient to a higher level of overall function and to restore their ability to perform activities of daily living.  62 year old male with a history of left shoulder pain secondary to left shoulder osteoarthritis and left rotator cuff dysfunction who is tried physical therapy as well as left shoulder steroid injections which were not helpful for his shoulder pain. Patient notes significant pain in his shoulder that limits his ability to perform activities of daily living has also resulted in depression.Because left shoulder joint injections have been unhelpful, we will not plan on repeating. Treatment plan were primarily consist of medication management since patient has tried interventional therapies along with physical therapy and they have not been very effective. For depression, patient is on Prozac. Because the patient's left shoulder pain  is worsening and he is having significant disability, we discussed performing a left shoulder MRI.   Patient was unable to get his left shoulder MRI performed secondary to insurance issues.  He states that his insurance is now in place and that he can get his left shoulder MRI.  He does find benefit with hydrocodone 5 mg twice daily.  Patient is endorsing moderate relief and is having pain 3-4 hours after his dose.    Plan: -Left shoulder MRI -Increase hydrocodone  from 5 mg twice daily as needed to 7.5 mill grams twice daily as needed -Follow-up in approximately 3-4 weeks.  Plan of Care  Pharmacotherapy (Medications Ordered): Meds ordered this encounter  Medications  . DISCONTD: HYDROcodone-acetaminophen (NORCO) 7.5-325 MG tablet    Sig: Take 1 tablet by mouth 2 (two) times daily as needed for moderate pain.    Dispense:  60 tablet    Refill:  0    Do not place this medication, or any other prescription from our practice, on "Automatic Refill". Patient may have prescription filled one day early if pharmacy is closed on scheduled refill date.  To fill on or after: 04/18/17, 05/18/16  . DISCONTD: HYDROcodone-acetaminophen (NORCO) 7.5-325 MG tablet    Sig: Take 1 tablet by mouth 2 (two) times daily as needed for moderate pain.    Dispense:  60 tablet    Refill:  0    Do not place this medication, or any other prescription from our practice, on "Automatic Refill". Patient may have prescription filled one day early if pharmacy is closed on scheduled refill date.  To fill on or after: 04/18/17, 05/18/16  . DISCONTD: HYDROcodone-acetaminophen (NORCO) 7.5-325 MG tablet    Sig: Take 1 tablet by mouth 2 (two) times daily as needed for moderate pain.    Dispense:  60 tablet    Refill:  0    Do not place this medication, or any other prescription from our practice, on "Automatic Refill". Patient may have prescription filled one day early if pharmacy is closed on scheduled refill date.  To fill on or after: 04/18/17, 05/18/17  . HYDROcodone-acetaminophen (NORCO) 7.5-325 MG tablet    Sig: Take 1 tablet by mouth 2 (two) times daily as needed for moderate pain.    Dispense:  60 tablet    Refill:  0    Do not place this medication, or any other prescription from our practice, on "Automatic Refill". Patient may have prescription filled one day early if pharmacy is closed on scheduled refill date.  To fill on or after: 04/18/17, 05/18/17   Lab-work, procedure(s),  and/or referral(s): Orders Placed This Encounter  Procedures  . MR SHOULDER LEFT WO CONTRAST    Provider-requested follow-up: Return in about 6 weeks (around 05/29/2017) for Medication Management. Time Note: Greater than 50% of the 25 minute(s) of face-to-face time spent with Mr. Mesa, was spent in counseling/coordination of care regarding: Mr. Dockendorf primary cause of pain, the treatment plan, the opioid analgesic risks and possible complications, the appropriate use of his medications, realistic expectations, the goals of pain management (increased in functionality) and the medication agreement. Future Appointments  Date Time Provider [place]  04/22/2017  1:00 PM Jerl Mina, PT [facility] None  05/01/2017  4:15 PM McGowan, Hunt Oris, PA-C [facility] None  05/27/2017 10:45 AM Gillis Santa, MD [facility] None    Primary Care Physician: Mikey College, NP Location: [facility] Note by: Gillis Santa, M.D Date: 04/17/2017; Time: 3:14  PM  There are no Patient Instructions on file for this visit.

## 2017-04-17 NOTE — Telephone Encounter (Signed)
Spoke with pt about gross hematuria and needing a OV to discuss hematuria workup. Pt stated that he has an OV March 7. Made pt aware that appt will work.

## 2017-04-18 ENCOUNTER — Telehealth: Payer: Self-pay | Admitting: *Deleted

## 2017-04-18 NOTE — Therapy (Addendum)
[place] MAIN [facility] SERVICES [address] [place], [place], [address] Phone: [phone]   Fax:  [phone]  Physical Therapy Treatment / Discharge Summary   Patient Details  Name: Carlos Reynolds MRN: [id number] Date of Birth: 1955/08/16 No Data Recorded  Encounter Date: 04/17/2017    Past Medical History:  Diagnosis Date  . Anxiety   . Arthritis   . Depression   . Heartburn   . HLD (hyperlipidemia)   . Hypertension   . Kidney stone     Past Surgical History:  Procedure Laterality Date  . COLONOSCOPY    . COLONOSCOPY WITH PROPOFOL N/A 04/15/2017   Procedure: COLONOSCOPY WITH PROPOFOL;  Surgeon: Jonathon Bellows, MD;  Location: [facility] ENDOSCOPY;  Service: Gastroenterology;  Laterality: N/A;  . NO PAST SURGERIES     verified with patient on 01/15/17    There were no vitals filed for this visit.  Subjective Assessment - 04/26/17 1029    Subjective  Pt 's pelvic pain is still the same level it was prior to PT.  Pt's shoulder level of pain 8/10 and he has an MRI coming up. Pt 's pain doctor plans to refer  him to a orthopedic surgeon.  After last session, his shoulder was much better but he thinks he overworked it with moving furniture and installed 16 window blinds     Pertinent History  Pt recently signed up for disability. Pt would love to go back to work. Pt would like to return to working with [employer].     Patient Stated Goals  comfort , relief,          [facility] PT Assessment - 04/26/17 1027      AROM   Overall AROM Comments  abduction BUE 90 deg       Palpation   Palpation comment  no tenderness/ tensinos at pelvic floor. tender and tensions at suprapubic area B and adductor attachments       Ambulation/Gait   Gait Pattern  -- more anterior COM, longer stride      6 minute walk test results    Aerobic Endurance Distance Walked  1500    Endurance additional comments  BP 120/70 mm Hg post Tx                   [facility] Adult PT Treatment/Exercise - 04/26/17 1026      Therapeutic Activites    Therapeutic Activities  -- 6 min walk test, edu on walking for health,warm up/cool down             PT Education - 04/26/17 1028    Education provided  Yes    Education Details  HEP/d/c     Person(s) Educated  Patient    Comprehension  Verbalized understanding;Returned demonstration;Verbal cues required;Tactile cues required          PT Long Term Goals - 04/17/17 1428      PT LONG TERM GOAL #1   Title  Pt will report decreased scrotum pain from 10/10 to < 5/10 in order to improve QOL     Time  4    Period  Weeks    Status  Not Met      PT LONG TERM GOAL #2   Title  Pt will return to regular bowel movements 2x/ day instead of needing a laxative in order to restore pelvic floor function    Time  10    Period  Weeks  Status  Achieved      PT LONG TERM GOAL #3   Title  Pt will report no tenderness/ and demo no tensions at ischiocavernosus B across 2 visits in order to progress to deep core exercises     Time  2    Period  Weeks    Status  Achieved      PT LONG TERM GOAL #4   Title  Pt will decrease QUICK DASH score  from 70% to > 75 % in order to restore L shoulder function to sleep in the same bed as his wife, pull doors, open jars, and lift grandchildren  (04/01/17: 70%)     Time  12    Period  Weeks    Status  On-going      PT LONG TERM GOAL #5   Title  Pt will decrease his PDI score from 80 % to <70% in order to improve QOL related to scrotal pain    Time  12    Period  Weeks    Status  Not Met      PT LONG TERM GOAL #6   Title  Pt will demo proper lifting mechanics with 40 lbs without shoulder/ pelvic pain in order to lift grandchildren     Time  12    Period  Weeks    Status  Achieved, modified with lighter weights due to shoulder pain     PT LONG TERM GOAL #7   Title  Pt will demo increased endurance with 6 min walk test from 1475 ft to > 1618f  in order to improve cardiovascular health and pelvic mobility to minimize relapse of pain  (2/21: 1500 ft)    Time  8    Period  Weeks    Status Partially met      PT LONG TERM GOAL #8   Title  Pt will improve hand grip strength in RUE from 40 N to > 60 N, ( L UE 80 N) in order to hold objects     Time  10    Period  Weeks    Status  Not Met            Plan - 04/26/17 1031    Clinical Impression Statement Pt has achieved 3/8 goals. Pt's scrotal pain decreased to 60 % per pt report across 3 visits.Pt was able to return sexual activities with his wife.  He achieved decreased pelvic floor tightness and gained more symmetry to his pelvic girdle along with increased deep core strength and coordination. His leg length difference was addressed with a shoe lift. Pt's sacroiliac joint and hips and feet/ankles also gained increased ROM and thus, helping pt with increased stride length with gait. Pt also reported his bowel movements have become more regular without use of laxatives. However, by his 4th sessions, pt reported the return of his pelvic pain.  Despite pt's compliance with his HEP and showing good carry over with no more pelvic floor tensions, PT suspects pt's pelvic pain may not be related to musculoskeletal causes. Pt would benefit from further medical work up. Pt reported he has notified his urologist about the blood in his urine and has visited their office to provide a urine sample.   Pt's L shoulder has shown improvements with partially increased AROM and stronger scapular stabilization and upright posture but he continues to have limitations and pain. Pt will be f/u with his orthopedic doctor for an MRI and understands the benefit  from pre/post rehab with PT if he undergoes surgery.   Pt has been educated the importance of maintaining health and wellness with less smoking and drinking alcohol and regular walking/ stretching. Pt voiced understanding.   Pt is d/c at this time. Thank you  for your referral!     Rehab Potential  Good    PT Frequency  2x / week    PT Duration  12 weeks    PT Treatment/Interventions  Manual lymph drainage;Neuromuscular re-education;Moist Heat;Therapeutic activities;Functional mobility training;Stair training;Gait training;Patient/family education;Passive range of motion;Manual techniques;Therapeutic exercise    Consulted and Agree with Plan of Care  Patient       Patient will benefit from skilled therapeutic intervention in order to improve the following deficits and impairments:  Decreased activity tolerance, Decreased mobility, Difficulty walking, Increased muscle spasms, Decreased strength, Pain, Decreased range of motion, Decreased safety awareness, Decreased coordination, Postural dysfunction, Improper body mechanics, Decreased endurance, Hypomobility  Visit Diagnosis: Sacrococcygeal disorders, not elsewhere classified  Leg length discrepancy  Chronic left shoulder pain  Myalgia     Problem List Patient Active Problem List   Diagnosis Date Noted  . Essential hypertension 03/25/2017  . Gastroesophageal reflux disease 03/25/2017  . Depression 01/21/2017  . Chronic left shoulder pain 01/21/2017  . Chronic pain syndrome 01/21/2017  . 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 Benign prostatic hyperplasia with incomplete bladder emptying 11/21/2016  . Erectile dysfunction 11/21/2016  . Current moderate episode of major depressive disorder without prior episode ([place]) 11/21/2016  . Eczema 11/21/2016    Jerl Mina ,PT, DPT, E-RYT  04/26/2017, 10:31 AM  [place] MAIN [facility] SERVICES [address] [place], [place], [address] Phone: [phone]   Fax:  [phone]  Name: Carlos Reynolds MRN: [id number] Date of Birth: [birth year]

## 2017-04-21 ENCOUNTER — Encounter: Payer: No Typology Code available for payment source | Admitting: Physical Therapy

## 2017-04-21 LAB — CULTURE, URINE COMPREHENSIVE

## 2017-04-21 MED ORDER — DIAZEPAM 2 MG PO TABS: 2.0000 mg | ORAL_TABLET | Freq: Once | ORAL | 0 refills | Status: DC | PRN

## 2017-04-21 NOTE — Telephone Encounter (Signed)
Rx for valium sent to pharmacy. Patient can take 1 tablet as needed before left shoulder MRI.  Requested Prescriptions   Signed Prescriptions Disp Refills  . diazepam (VALIUM) 2 MG tablet 1 tablet 0    Sig: Take 1 tablet (2 mg total) by mouth once as needed for up to 1 dose for anxiety. As needed for MRI.    Authorizing Provider: Edward JollyLATEEF, Guillermo Nehring

## 2017-04-21 NOTE — Telephone Encounter (Signed)
Forwarded to Dr Lateef 

## 2017-04-22 ENCOUNTER — Encounter: Payer: No Typology Code available for payment source | Admitting: Physical Therapy

## 2017-04-22 ENCOUNTER — Telehealth: Payer: Self-pay

## 2017-04-22 MED ORDER — AMOXICILLIN-POT CLAVULANATE 875-125 MG PO TABS: 1.0000 | ORAL_TABLET | Freq: Two times a day (BID) | ORAL | 0 refills | Status: DC

## 2017-04-22 NOTE — Telephone Encounter (Signed)
-----   Message from Harle BattiestShannon A McGowan, PA-C sent at 04/21/2017  1:57 PM EST ----- Please let Mr. Mclaine know that his urine culture was positive.  He needs to start Augmentin 875/125, bid for 7 days.  I still need to see him.

## 2017-04-22 NOTE — Telephone Encounter (Signed)
LMOM- abx sent to pharmacy 

## 2017-04-25 NOTE — Telephone Encounter (Signed)
Spoke with pharmacy who stated pt did pick up medication.

## 2017-04-25 NOTE — Telephone Encounter (Signed)
LMOM for both pt home and cell phone. Will call pharmacy when they open to ensure pt picked up abx.

## 2017-04-28 ENCOUNTER — Ambulatory Visit
Admission: RE | Admit: 2017-04-28 | Discharge: 2017-04-28 | Disposition: A | Discharge status: Home / Self Care | Payer: No Typology Code available for payment source | Source: Ambulatory Visit | Attending: Student in an Organized Health Care Education/Training Program | Admitting: Student in an Organized Health Care Education/Training Program

## 2017-04-28 DIAGNOSIS — M75112 Incomplete rotator cuff tear or rupture of left shoulder, not specified as traumatic: Secondary | ICD-10-CM | POA: Insufficient documentation

## 2017-04-28 DIAGNOSIS — G8929 Other chronic pain: Secondary | ICD-10-CM | POA: Diagnosis present

## 2017-04-28 DIAGNOSIS — M25512 Pain in left shoulder: Secondary | ICD-10-CM | POA: Diagnosis present

## 2017-04-29 ENCOUNTER — Other Ambulatory Visit: Payer: Self-pay | Admitting: Student in an Organized Health Care Education/Training Program

## 2017-04-30 ENCOUNTER — Telehealth: Payer: Self-pay | Admitting: Nurse Practitioner

## 2017-04-30 NOTE — Telephone Encounter (Signed)
Pt. Called requesting a refill on g[facility]apentin [facility]

## 2017-05-01 ENCOUNTER — Ambulatory Visit (INDEPENDENT_AMBULATORY_CARE_PROVIDER_SITE_OTHER): Payer: No Typology Code available for payment source | Admitting: Urology

## 2017-05-01 ENCOUNTER — Telehealth: Payer: Self-pay | Admitting: Student in an Organized Health Care Education/Training Program

## 2017-05-01 ENCOUNTER — Encounter: Payer: Self-pay | Admitting: Urology

## 2017-05-01 VITALS — BP 144/89 | HR 80 | Ht 70.0 in | Wt 228.0 lb

## 2017-05-01 DIAGNOSIS — N390 Urinary tract infection, site not specified: Secondary | ICD-10-CM

## 2017-05-01 DIAGNOSIS — N529 Male erectile dysfunction, unspecified: Secondary | ICD-10-CM

## 2017-05-01 DIAGNOSIS — N138 Other obstructive and reflux uropathy: Secondary | ICD-10-CM

## 2017-05-01 DIAGNOSIS — N401 Enlarged prostate with lower urinary tract symptoms: Secondary | ICD-10-CM

## 2017-05-01 DIAGNOSIS — R310 Gross hematuria: Secondary | ICD-10-CM

## 2017-05-01 LAB — BLADDER SCAN AMB NON-IMAGING: Scan Result: 30

## 2017-05-01 MED ORDER — SILDENAFIL CITRATE 20 MG PO TABS: ORAL_TABLET | ORAL | 3 refills | Status: DC

## 2017-05-01 MED ORDER — GABAPENTIN 300 MG PO CAPS: 300.0000 mg | ORAL_CAPSULE | Freq: Two times a day (BID) | ORAL | 2 refills | Status: DC

## 2017-05-01 NOTE — Telephone Encounter (Signed)
Patient needs script for gabapentin, was not called in last visit. Please [phone] if any questions [facility]

## 2017-05-01 NOTE — Progress Notes (Signed)
05/01/2017 8:36 AM   Carlos Reynolds 17-Jun-1955 [id number]  Referring provider: Galen Manila, NP [address] [place], [place] [address]  Chief Complaint  Patient presents with  . Follow-up    HPI: Patient is a 62 year old African-American male with BPH and LUT S and erectile dysfunction and chronic pelvic pain who presents today for a three months follow up.   At his visit on 01/15/2017, he was complaining of bilateral testicular pain.  He stated the pain was so severe he could not have intercourse.  Scrotal ultrasound performed on 01/21/2017 noted a 8 mm mildly heterogeneous left epididymal cyst.  Small left varicocele.  Otherwise normal testicular ultrasound.  He is currently undergoing PT with Dr. Mariane Masters.   He has already seen an improvement in his testicular pain.  He is not experiencing fevers, chills, nausea or vomiting.  He is not having dysuria, gross hematuria or suprapubic pain.  He was having gross hematuria on 04/17/2017.  His UA on 04/17/2017 was negative, but his urine culture was positive for Beta hemolytic Streptococcus, group B.  He completed a course of Augmentin 875/125, bid for seven days.    His UA today (05/01/2017) is negative.   BPH WITH LUTS  (prostate and/or bladder) IPSS score: 17/4   PVR: 30 mL  Previous score: 16/5   Previous PVR: 55 mL  Major complaint(s): Frequency, urgency, dysuria, nocturia and incontinence x for several months   Denies any dysuria, hematuria or suprapubic pain.   Currently taking: tamsulosin 0.4 mg daily  Denies any recent fevers, chills, nausea or vomiting.  His father was diagnosed with fatal prostate cancer at age 41.    IPSS    Row Name 05/01/17 1300         International Prostate Symptom Score   How often have you had the sensation of not emptying your bladder?  About half the time     How often have you had to urinate less than every two hours?  About half the time     How often have you found you  stopped and started again several times when you urinated?  Less than 1 in 5 times     How often have you found it difficult to postpone urination?  Almost always     How often have you had a weak urinary stream?  About half the time     How often have you had to strain to start urination?  Not at All     How many times did you typically get up at night to urinate?  2 Times     Total IPSS Score  17       Quality of Life due to urinary symptoms   If you were to spend the rest of your life with your urinary condition just the way it is now how would you feel about that?  Mostly Disatisfied        Score:  1-7 Mild 8-19 Moderate 20-35 Severe  Erectile dysfunction His SHIM score is 11, which is moderate ED.   His previous SHIM score was 12.  He has been having difficulty with erections for over a year.  His major complaint is erections not firm enough.  His libido is preserved.   His risk factors for ED are age, BPH, HTN, alcohol abuse, smoking and pain medication.  He denies any painful erections or curvatures with his erections.   He is still having spontaneous erections.  He has tried sildenafil in the past with good results.  SHIM    Row Name 05/01/17 1342         SHIM: Over the last 6 months:   How do you rate your confidence that you could get and keep an erection?  Low     When you had erections with sexual stimulation, how often were your erections hard enough for penetration (entering your partner)?  A Few Times (much less than half the time)     During sexual intercourse, how often were you able to maintain your erection after you had penetrated (entered) your partner?  A Few Times (much less than half the time)     During sexual intercourse, how difficult was it to maintain your erection to completion of intercourse?  Very Difficult     When you attempted sexual intercourse, how often was it satisfactory for you?  Sometimes (about half the time)       SHIM Total Score   SHIM  11         Score: 1-7 Severe ED 8-11 Moderate ED 12-16 Mild-Moderate ED 17-21 Mild ED 22-25 No ED  PMH: Past Medical History:  Diagnosis Date  . Anxiety   . Arthritis   . Depression   . Heartburn   . HLD (hyperlipidemia)   . Hypertension   . Kidney stone     Surgical History: Past Surgical History:  Procedure Laterality Date  . COLONOSCOPY    . COLONOSCOPY WITH PROPOFOL N/A 04/15/2017   Procedure: COLONOSCOPY WITH PROPOFOL;  Surgeon: Wyline Mood, MD;  Location: [facility] ENDOSCOPY;  Service: Gastroenterology;  Laterality: N/A;  . NO PAST SURGERIES     verified with patient on 01/15/17    Home Medications:  Allergies as of 05/01/2017      Reactions   Shellfish Allergy Hives   Tape    Brown, elastic tape used by physical therapist; skin breaks out   Amlodipine Other (See Comments)   Possible hand swelling   Lisinopril Other (See Comments)   Cough      Medication List        Accurate as of 05/01/17 11:59 PM. Always use your most recent med list.          amoxicillin-clavulanate 875-125 MG tablet Commonly known as:  AUGMENTIN Take 1 tablet by mouth every 12 (twelve) hours.   diazepam 2 MG tablet Commonly known as:  VALIUM Take 1 tablet (2 mg total) by mouth once as needed for up to 1 dose for anxiety. As needed for MRI.   diclofenac 75 MG EC tablet Commonly known as:  VOLTAREN Take 1 tablet (75 mg total) by mouth 2 (two) times daily.   FLUoxetine 20 MG capsule Commonly known as:  PROZAC Take 1 capsule (20 mg total) by mouth daily.   gabapentin 300 MG capsule Commonly known as:  NEURONTIN Take 1 capsule (300 mg total) by mouth 2 (two) times daily.   hydrochlorothiazide 25 MG tablet Commonly known as:  HYDRODIURIL Take 1 tablet (25 mg total) by mouth daily.   HYDROcodone-acetaminophen 7.5-325 MG tablet Commonly known as:  NORCO Take 1 tablet by mouth 2 (two) times daily as needed for moderate pain.   omeprazole 20 MG capsule Commonly known as:   PRILOSEC TAKE 1 CAPSULE (20 MG) BY MOUTH ONCE DAILY   sildenafil 20 MG tablet Commonly known as:  REVATIO Take 3 to 5 tablets two hours before intercouse on an empty stomach.  Do not  take with nitrates.   tamsulosin 0.4 MG Caps capsule Commonly known as:  FLOMAX Take 2 capsules (0.8 mg total) by mouth daily.       Allergies:  Allergies  Allergen Reactions  . Shellfish Allergy Hives  . Tape     Brown, elastic tape used by physical therapist; skin breaks out  . Amlodipine Other (See Comments)    Possible hand swelling  . Lisinopril Other (See Comments)    Cough    Family History: Family History  Problem Relation Age of Onset  . Heart disease Mother   . Colon cancer Mother   . Diabetes Father   . Prostate cancer Father   . Colon cancer Brother   . Heart disease Brother   . Kidney cancer Neg Hx   . Bladder Cancer Neg Hx     Social History:  reports that he has been smoking cigarettes.  He has a 23.00 pack-year smoking history. He has never used smokeless tobacco. He reports that he drinks about 8.4 oz of alcohol per week. He reports that he does not use drugs.  ROS: UROLOGY Frequent Urination?: Yes Hard to postpone urination?: Yes Burning/pain with urination?: Yes Get up at night to urinate?: Yes Leakage of urine?: Yes Urine stream starts and stops?: No Trouble starting stream?: No Do you have to strain to urinate?: No Blood in urine?: Yes Urinary tract infection?: No Sexually transmitted disease?: No Injury to kidneys or bladder?: No Painful intercourse?: Yes Weak stream?: No Erection problems?: Yes Penile pain?: Yes  Gastrointestinal Nausea?: No Vomiting?: No Indigestion/heartburn?: Yes Diarrhea?: Yes Constipation?: No  Constitutional Fever: No Night sweats?: No Weight loss?: No Fatigue?: No  Skin Skin rash/lesions?: Yes Itching?: No  Eyes Blurred vision?: Yes Double vision?: No  Ears/Nose/Throat Sore throat?: No Sinus problems?:  Yes  Hematologic/Lymphatic Swollen glands?: No Easy bruising?: No  Cardiovascular Leg swelling?: No Chest pain?: No  Respiratory Cough?: No Shortness of breath?: Yes  Endocrine Excessive thirst?: No  Musculoskeletal Back pain?: Yes Joint pain?: Yes  Neurological Headaches?: No Dizziness?: No  Psychologic Depression?: Yes Anxiety?: Yes  Physical Exam: BP (!) 144/89   Pulse 80   Ht 5\' 10"  (1.778 m)   Wt 228 lb (103.4 kg)   BMI 32.71 kg/m   Constitutional: Well nourished. Alert and oriented, No acute distress. HEENT: [place] AT, moist mucus membranes. Trachea midline, no masses. Cardiovascular: No clubbing, cyanosis, or edema. Respiratory: Normal respiratory effort, no increased work of breathing. GI: Abdomen is soft, non tender, non distended, no abdominal masses. Liver and spleen not palpable.  No hernias appreciated.  Stool sample for occult testing is not indicated.   GU: No CVA tenderness.  No bladder fullness or masses.  Patient with circumcised phallus.   Urethral meatus is patent.  No penile discharge. No penile lesions or rashes. Scrotum without lesions, cysts, rashes and/or edema.  Testicles are located scrotally bilaterally. No masses are appreciated in the testicles. Left and right epididymis are normal. Rectal: Patient with  normal sphincter tone. Anus and perineum without scarring or rashes. No rectal masses are appreciated. Prostate is approximately 50 grams, no nodules are appreciated. Seminal vesicles are normal. Skin: No rashes, bruises or suspicious lesions. Lymph: No cervical or inguinal adenopathy. Neurologic: Grossly intact, no focal deficits, moving all 4 extremities. Psychiatric: Normal mood and affect.  Laboratory Data: PSA History  1.1 in 02/2013  0.6 in 11/2013  1.1 in 11/2016 Lab Results  Component Value Date   WBC 8.1 08/12/2016  HGB 14.9 08/12/2016   HCT 43.6 08/12/2016   MCV 88.8 08/12/2016   PLT 282 08/12/2016    Lab Results   Component Value Date   CREATININE 1.04 08/12/2016    Lab Results  Component Value Date   AST 21 08/12/2016   Lab Results  Component Value Date   ALT 23 08/12/2016    Urinalysis Negative.  See Epic.   I have reviewed the labs.  Pertinent imaging Results for Carlos BustardSNIPES, Carlos (MRN [id number]) as of 05/14/2017 08:52  Ref. Range 05/01/2017 14:25  Scan Result Unknown 30    Assessment & Plan:    1. Gross hematuria Associated with an UTI - has since resolve UA is negative today Will continue to monitor and patient will report any episodes of hematuria  2. BPH with LU TS  - IPSS score is 17/4, it is worsening  - Continue conservative management, avoiding bladder irritants and timed voiding's  - most bothersome symptoms is/are frequency and urgency  - Initiate Myrbetriq 25 mg daily; I have advised the patient of the side effects of Myrbetriq, such as: elevation in BP, urinary retention and/or HA.  - Continue tamsulosin 0.4 mg daily  - RTC in 3 weeks for I PSS and PVR  3. Erectile dysfunction Given a script for Sildenafil 20 mg, 3 to 5 tablets two hours prior to intercourse on an empty stomach, # 50; he is warned not to take medications that contain nitrates.  I also advised him of the side effects, such as: headache, flushing, dyspepsia, abnormal vision, nasal congestion, back pain, myalgia, nausea, dizziness, and rash. RTC in 3 weeks for SHIM   Return in about 3 weeks (around 05/22/2017) for IPSS and PVR.  These notes generated with voice recognition software. I apologize for typographical errors.  Michiel CowboySHANNON Cowen Pesqueira, PA-C  [facility] [address], [address] [place], [place] [id number] [phone]

## 2017-05-01 NOTE — Telephone Encounter (Signed)
Gabepentin Rx sent in.  Requested Prescriptions   Signed Prescriptions Disp Refills  . gabapentin (NEURONTIN) 300 MG capsule 60 capsule 2    Sig: Take 1 capsule (300 mg total) by mouth 2 (two) times daily.    Authorizing Provider: Edward JollyLATEEF, Shatiqua Heroux

## 2017-05-02 LAB — URINALYSIS, COMPLETE
BILIRUBIN UA: NEGATIVE
Glucose, UA: NEGATIVE
Leukocytes, UA: NEGATIVE
NITRITE UA: NEGATIVE
Protein, UA: NEGATIVE
RBC UA: NEGATIVE
UUROB: 0.2 mg/dL (ref 0.2–1.0)
pH, UA: 5.5 (ref 5.0–7.5)

## 2017-05-02 NOTE — Telephone Encounter (Signed)
This medications was ordered by Dr. Letitia LibraLateff yesterday 05/01/17

## 2017-05-09 ENCOUNTER — Telehealth: Payer: Self-pay | Admitting: Urology

## 2017-05-09 NOTE — Telephone Encounter (Signed)
Received a call from the PT dept and they wanted to make sure that you look at his notes from his last session.   Marcelino DusterMichelle

## 2017-05-20 ENCOUNTER — Encounter: Payer: Self-pay | Admitting: Emergency Medicine

## 2017-05-20 ENCOUNTER — Other Ambulatory Visit: Payer: Self-pay

## 2017-05-20 ENCOUNTER — Emergency Department: Payer: No Typology Code available for payment source

## 2017-05-20 ENCOUNTER — Emergency Department
Admission: EM | Admit: 2017-05-20 | Discharge: 2017-05-20 | Disposition: A | Discharge status: Home / Self Care | Payer: No Typology Code available for payment source | Source: Home / Self Care | Attending: Emergency Medicine | Admitting: Emergency Medicine

## 2017-05-20 DIAGNOSIS — K852 Alcohol induced acute pancreatitis without necrosis or infection: Secondary | ICD-10-CM | POA: Diagnosis not present

## 2017-05-20 DIAGNOSIS — F17210 Nicotine dependence, cigarettes, uncomplicated: Secondary | ICD-10-CM | POA: Insufficient documentation

## 2017-05-20 DIAGNOSIS — R1013 Epigastric pain: Secondary | ICD-10-CM | POA: Diagnosis not present

## 2017-05-20 DIAGNOSIS — I10 Essential (primary) hypertension: Secondary | ICD-10-CM | POA: Insufficient documentation

## 2017-05-20 DIAGNOSIS — Z87442 Personal history of urinary calculi: Secondary | ICD-10-CM | POA: Insufficient documentation

## 2017-05-20 DIAGNOSIS — K8500 Idiopathic acute pancreatitis without necrosis or infection: Secondary | ICD-10-CM

## 2017-05-20 DIAGNOSIS — K859 Acute pancreatitis without necrosis or infection, unspecified: Secondary | ICD-10-CM

## 2017-05-20 DIAGNOSIS — Z79899 Other long term (current) drug therapy: Secondary | ICD-10-CM

## 2017-05-20 HISTORY — DX: Acute pancreatitis without necrosis or infection, unspecified: K85.90

## 2017-05-20 LAB — CBC
HEMATOCRIT: 43.4 % (ref 40.0–52.0)
HEMOGLOBIN: 14.6 g/dL (ref 13.0–18.0)
MCH: 30.4 pg (ref 26.0–34.0)
MCHC: 33.7 g/dL (ref 32.0–36.0)
MCV: 90.1 fL (ref 80.0–100.0)
Platelets: 272 10*3/uL (ref 150–440)
RBC: 4.82 MIL/uL (ref 4.40–5.90)
RDW: 14 % (ref 11.5–14.5)
WBC: 6.7 10*3/uL (ref 3.8–10.6)

## 2017-05-20 LAB — HEPATIC FUNCTION PANEL
ALT: 57 U/L (ref 17–63)
AST: 42 U/L — ABNORMAL HIGH (ref 15–41)
Albumin: 3.9 g/dL (ref 3.5–5.0)
Alkaline Phosphatase: 64 U/L (ref 38–126)
BILIRUBIN INDIRECT: 0.6 mg/dL (ref 0.3–0.9)
Bilirubin, Direct: 0.1 mg/dL (ref 0.1–0.5)
TOTAL PROTEIN: 7.5 g/dL (ref 6.5–8.1)
Total Bilirubin: 0.7 mg/dL (ref 0.3–1.2)

## 2017-05-20 LAB — BASIC METABOLIC PANEL
Anion gap: 10 (ref 5–15)
BUN: 25 mg/dL — AB (ref 6–20)
CHLORIDE: 103 mmol/L (ref 101–111)
CO2: 23 mmol/L (ref 22–32)
Calcium: 8.9 mg/dL (ref 8.9–10.3)
Creatinine, Ser: 1.26 mg/dL — ABNORMAL HIGH (ref 0.61–1.24)
GFR calc Af Amer: >60 mL/min (ref >60)
GFR calc non Af Amer: 60 mL/min — ABNORMAL LOW (ref >60)
GLUCOSE: 108 mg/dL — AB (ref 65–99)
POTASSIUM: 4 mmol/L (ref 3.5–5.1)
SODIUM: 136 mmol/L (ref 135–145)

## 2017-05-20 LAB — LIPASE, BLOOD: LIPASE: 67 U/L — AB (ref 11–51)

## 2017-05-20 MED ORDER — ONDANSETRON HCL 4 MG/2ML IJ SOLN
4.0000 mg | Freq: Once | INTRAMUSCULAR | Status: AC
  Administered 2017-05-20: 4 mg via INTRAVENOUS

## 2017-05-20 MED ORDER — MORPHINE SULFATE (PF) 4 MG/ML IV SOLN
INTRAVENOUS | Status: AC
  Administered 2017-05-20: 4 mg via INTRAVENOUS
  Filled 2017-05-20: qty 1

## 2017-05-20 MED ORDER — HYDROMORPHONE HCL 1 MG/ML IJ SOLN
0.5000 mg | Freq: Once | INTRAMUSCULAR | Status: AC
  Administered 2017-05-20: 0.5 mg via INTRAVENOUS
  Filled 2017-05-20: qty 1

## 2017-05-20 MED ORDER — MORPHINE SULFATE (PF) 4 MG/ML IV SOLN
4.0000 mg | Freq: Once | INTRAVENOUS | Status: AC
  Administered 2017-05-20: 4 mg via INTRAVENOUS

## 2017-05-20 MED ORDER — OXYCODONE HCL 5 MG PO TABS: 5.0000 mg | ORAL_TABLET | Freq: Three times a day (TID) | ORAL | 0 refills | Status: DC | PRN

## 2017-05-20 MED ORDER — ONDANSETRON HCL 4 MG/2ML IJ SOLN
INTRAMUSCULAR | Status: AC
  Administered 2017-05-20: 4 mg via INTRAVENOUS
  Filled 2017-05-20: qty 2

## 2017-05-20 NOTE — ED Triage Notes (Signed)
Pt in via POV with complaints of left flank pain since 0300 today, denies any urinary symptoms, reports hx of kidney stones.  Vitals WDL, NAD noted at this time.

## 2017-05-20 NOTE — Discharge Instructions (Signed)
Drink clear liquids today. You can try something with more substance like chicken soup or other bland foods tomorrow. Use the Percocet 1 or 2 pills 4 times a day for pain. Return here for worse pain fever or vomiting. If the pain gets as bad as it was when he first came here please make sure you come back. Follow up with your regular doctor in  3-4 days.

## 2017-05-20 NOTE — ED Provider Notes (Signed)
[facility] Emergency Department Provider Note   ____________________________________________   First MD Initiated Contact with Patient 05/20/17 [phone]     (approximate)  I have reviewed the triage vital signs and the nursing notes.   HISTORY  Chief Complaint Flank Pain    HPI Carlos Reynolds is a 62 y.o. male who reports left flank pain since 3:00 this morning. He's had hematuria recently and has been seeing his urologist. He got antibiotics for [facility]. He does not have any urinary symptoms at present. He has a history of stones several years ago but does not remember passing them. The pain he is having now feels like what he had when he had the kidney stone.he is not having any fever or vomiting or any other symptoms   Past Medical History:  Diagnosis Date  . Anxiety   . Arthritis   . Depression   . Heartburn   . HLD (hyperlipidemia)   . Hypertension   . Kidney stone     Patient Active Problem List   Diagnosis Date Noted  . Essential hypertension 03/25/2017  . Gastroesophageal reflux disease 03/25/2017  . Depression 01/21/2017  . Chronic left shoulder pain 01/21/2017  . Chronic pain syndrome 01/21/2017  . 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 Benign prostatic hyperplasia with incomplete bladder emptying 11/21/2016  . Erectile dysfunction 11/21/2016  . Current moderate episode of major depressive disorder without prior episode (HCC) 11/21/2016  . Eczema 11/21/2016    Past Surgical History:  Procedure Laterality Date  . COLONOSCOPY    . COLONOSCOPY WITH PROPOFOL N/A 04/15/2017   Procedure: COLONOSCOPY WITH PROPOFOL;  Surgeon: Wyline Mood, MD;  Location: [facility] ENDOSCOPY;  Service: Gastroenterology;  Laterality: N/A;  . NO PAST SURGERIES     verified with patient on 01/15/17    Prior to Admission medications   Medication Sig Start Date End Date Taking? Authorizing Provider  FLUoxetine (PROZAC) 20 MG capsule Take 1 capsule (20 mg  total) by mouth daily. 03/18/17  Yes Galen Manila, NP  gabapentin (NEURONTIN) 300 MG capsule Take 1 capsule (300 mg total) by mouth 2 (two) times daily. 05/01/17  Yes Edward Jolly, MD  hydrochlorothiazide (HYDRODIURIL) 25 MG tablet Take 1 tablet (25 mg total) by mouth daily. 03/18/17  Yes Galen Manila, NP  HYDROcodone-acetaminophen (NORCO) 7.5-325 MG tablet Take 1 tablet by mouth 2 (two) times daily as needed for moderate pain. 04/17/17  Yes Edward Jolly, MD  mirabegron ER (MYRBETRIQ) 25 MG TB24 tablet Take 25 mg by mouth daily.   Yes [provider]  omeprazole (PRILOSEC) 20 MG capsule TAKE 1 CAPSULE (20 MG) BY MOUTH ONCE DAILY 03/18/17  Yes Galen Manila, NP  sildenafil (REVATIO) 20 MG tablet Take 3 to 5 tablets two hours before intercouse on an empty stomach.  Do not take with nitrates. 05/01/17  Yes McGowan, Carollee Herter A, PA-C  tamsulosin (FLOMAX) 0.4 MG CAPS capsule Take 2 capsules (0.8 mg total) by mouth daily. 03/25/17  Yes Galen Manila, NP  amoxicillin-clavulanate (AUGMENTIN) 875-125 MG tablet Take 1 tablet by mouth every 12 (twelve) hours. Patient not taking: Reported on 05/01/2017 04/22/17   Michiel Cowboy A, PA-C  diazepam (VALIUM) 2 MG tablet Take 1 tablet (2 mg total) by mouth once as needed for up to 1 dose for anxiety. As needed for MRI. Patient not taking: Reported on 05/01/2017 04/21/17   Edward Jolly, MD  diclofenac (VOLTAREN) 75 MG EC tablet Take 1 tablet (75 mg total)  by mouth 2 (two) times daily. Patient not taking: Reported on 05/20/2017 03/18/17   Galen Manila, NP  oxyCODONE (ROXICODONE) 5 MG immediate release tablet Take 1 tablet (5 mg total) by mouth every 8 (eight) hours as needed. 05/20/17 05/20/18  Arnaldo Natal, MD    Allergies Shellfish allergy; Tape; Amlodipine; and Lisinopril  Family History  Problem Relation Age of Onset  . Heart disease Mother   . Colon cancer Mother   . Diabetes Father   . Prostate cancer Father   .  Colon cancer Brother   . Heart disease Brother   . Kidney cancer Neg Hx   . Bladder Cancer Neg Hx     Social History Social History   Tobacco Use  . Smoking status: Current Every Day Smoker    Packs/day: 0.50    Years: 46.00    Pack years: 23.00    Types: Cigarettes  . Smokeless tobacco: Never Used  Substance Use Topics  . Alcohol use: Yes    Alcohol/week: 8.4 oz    Types: 14 Shots of liquor per week    Comment: 2 drinks of Liquor daily  . Drug use: No    Review of Systems  Constitutional: No fever/chills Eyes: No visual changes. ENT: No sore throat. Cardiovascular: Denies chest pain. Respiratory: Denies shortness of breath. Gastrointestinal abdominal pain.  No nausea, no vomiting.  No diarrhea.  No constipation. Genitourinary: Negative for dysuria. Musculoskeletal: Negative for back pain. Skin: Negative for rash. Neurological: Negative for headaches, focal weakness   ____________________________________________   PHYSICAL EXAM:  VITAL SIGNS: ED Triage Vitals  Enc Vitals Group     BP 05/20/17 0745 (!) 146/89     Pulse Rate 05/20/17 0745 68     Resp 05/20/17 0745 16     Temp 05/20/17 0745 98 F (36.7 C)     Temp Source 05/20/17 0745 Oral     SpO2 05/20/17 0745 95 %     Weight 05/20/17 0746 217 lb (98.4 kg)     Height 05/20/17 0746 5\' 9"  (1.753 m)     Head Circumference --      Peak Flow --      Pain Score 05/20/17 0745 10     Pain Loc --      Pain Edu? --      Excl. in GC? --     Constitutional: Alert and oriented. Well appearing and in no acute distress. Eyes: Conjunctivae are normal.  Head: Atraumatic. Nose: No congestion/rhinnorhea. Mouth/Throat: Mucous membranes are moist.  Oropharynx non-erythematous. Neck: No stridor.   Cardiovascular: Normal rate, regular rhythm. Grossly normal heart sounds.  Good peripheral circulation. Respiratory: Normal respiratory effort.  No retractions. Lungs CTAB. Gastrointestinal: Soft mild tenderness in the left  mid abdomen. No distention. No abdominal bruits. No right CVA tendernessminimal left CVA tenderness. Musculoskeletal: No lower extremity tenderness nor edema.  No joint effusions. Neurologic:  Normal speech and language. No gross focal neurologic deficits are appreciated. y. Skin:  Skin is warm, dry and intact. No rash noted. Psychiatric: Mood and affect are normal. Speech and behavior are normal.  ____________________________________________   LABS (all labs ordered are listed, but only abnormal results are displayed)  Labs Reviewed  BASIC METABOLIC PANEL - Abnormal; Notable for the following components:      Result Value   Glucose, Bld 108 (*)    BUN 25 (*)    Creatinine, Ser 1.26 (*)    GFR calc non Af Amer 60 (*)  All other components within normal limits  LIPASE, BLOOD - Abnormal; Notable for the following components:   Lipase 67 (*)    All other components within normal limits  HEPATIC FUNCTION PANEL - Abnormal; Notable for the following components:   AST 42 (*)    All other components within normal limits  CBC  URINALYSIS, COMPLETE (UACMP) WITH MICROSCOPIC   ____________________________________________  EKG   ____________________________________________  RADIOLOGY  ED MD interpretationCT read by radiology as possible mild pancreatitis.  Official radiology report(s): Ct Renal Stone Study  Result Date: 05/20/2017 CLINICAL DATA:  Left-sided flank pain today, history of kidney stones EXAM: CT ABDOMEN AND PELVIS WITHOUT CONTRAST TECHNIQUE: Multidetector CT imaging of the abdomen and pelvis was performed following the standard protocol without IV contrast. COMPARISON:  CT abdomen pelvis of 03/10/2013 FINDINGS: Lower chest: In the lung bases, there is a 5 mm noncalcified nodule within the lingula on image 1. No additional abnormality is seen within the lung bases. No effusion is seen. Heart is mildly enlarged. No pericardial effusion is seen. Hepatobiliary: The liver is  diffusely low in attenuation consistent with fatty infiltration diffusely. Some sparing is noted near the gallbladder. No calcified gallstones are seen. Pancreas: The pancreas is normal in size and the pancreatic duct is not dilated. Better seen on coronal images there is slight peripancreatic strandiness along the distal aspect of the pancreas of questionable significance. Very mild pancreatitis would be difficult to exclude with this exam. Spleen: The spleen is unremarkable. Adrenals/Urinary Tract: No renal calculi are seen. There is no evidence of hydronephrosis. The ureters appear normal in caliber. The urinary bladder is moderately urine distended with no abnormality noted. Stomach/Bowel: The stomach is not well distended but no abnormality is evident. No small bowel distention is seen. Much of the colon is decompressed and cannot be evaluated. No evidence of diverticulitis is seen. The terminal ileum and the appendix are well visualized with no abnormality noted. Vascular/Lymphatic: The abdominal aorta is normal in caliber with mild abdominal aortic atherosclerosis present. No adenopathy is seen. Reproductive: The prostate is normal in size. Other: Some fat does enter both inguinal canals left-greater-than-right. Musculoskeletal: Mild degenerative changes are present in both hips. The SI joints are unremarkable. The lumbar vertebrae are normal alignment with normal intervertebral disc spaces. IMPRESSION: 1. The only questionable abnormality is slight strandiness of the peripancreatic fat planes near the tail of the pancreas. Mild pancreatitis would be difficult to exclude. Correlate clinically. 2. No renal or ureteral calculi are seen. There is no evidence of hydronephrosis. 3. Diffuse fatty infiltration of the liver. 4. The appendix and terminal ileum are unremarkable. Electronically Signed   By: Dwyane Dee M.D.   On: 05/20/2017 09:52     ____________________________________________   PROCEDURES  Procedure(s) performed:   Procedures  Critical Care performed:   ____________________________________________   INITIAL IMPRESSION / ASSESSMENT AND PLAN / ED COURSE  patient's lipase is elevated slightly. His LFTs are minimally elevated. His pain is much better he is not nauseated or vomiting. I will try to treat him at home for his pancreatitis which is very mild. We'll give him some Percocets. He will return if he is worse as a fever or vomiting or feels sicker. He will follow-up with his regular doctor in the next few days.         ____________________________________________   FINAL CLINICAL IMPRESSION(S) / ED DIAGNOSES  Final diagnoses:  Idiopathic acute pancreatitis without infection or necrosis     ED Discharge  Orders        Ordered    oxyCODONE (ROXICODONE) 5 MG immediate release tablet  Every 8 hours PRN     05/20/17 1152       Note:  This document was prepared using Dragon voice recognition software and may include unintentional dictation errors.    Arnaldo NatalMalinda, Brewer Hitchman F, MD 05/20/17 1153

## 2017-05-20 NOTE — ED Notes (Signed)
Patient ambulatory to lobby with steady gait, NAD noted. Verbalized understanding of discharge instructions and followup care.  

## 2017-05-21 ENCOUNTER — Inpatient Hospital Stay
Admission: RE | Admit: 2017-05-21 | Discharge: 2017-05-23 | DRG: 440 | Disposition: A | Discharge status: Home / Self Care | Payer: No Typology Code available for payment source | Attending: Internal Medicine | Admitting: Internal Medicine

## 2017-05-21 ENCOUNTER — Encounter: Payer: Self-pay | Admitting: Emergency Medicine

## 2017-05-21 ENCOUNTER — Telehealth: Payer: Self-pay | Admitting: Family Medicine

## 2017-05-21 ENCOUNTER — Emergency Department: Payer: No Typology Code available for payment source

## 2017-05-21 ENCOUNTER — Other Ambulatory Visit: Payer: Self-pay

## 2017-05-21 DIAGNOSIS — Z8249 Family history of ischemic heart disease and other diseases of the circulatory system: Secondary | ICD-10-CM

## 2017-05-21 DIAGNOSIS — Z91013 Allergy to seafood: Secondary | ICD-10-CM

## 2017-05-21 DIAGNOSIS — Z8042 Family history of malignant neoplasm of prostate: Secondary | ICD-10-CM

## 2017-05-21 DIAGNOSIS — K219 Gastro-esophageal reflux disease without esophagitis: Secondary | ICD-10-CM | POA: Diagnosis present

## 2017-05-21 DIAGNOSIS — R748 Abnormal levels of other serum enzymes: Secondary | ICD-10-CM

## 2017-05-21 DIAGNOSIS — R3914 Feeling of incomplete bladder emptying: Secondary | ICD-10-CM | POA: Diagnosis present

## 2017-05-21 DIAGNOSIS — Z91048 Other nonmedicinal substance allergy status: Secondary | ICD-10-CM

## 2017-05-21 DIAGNOSIS — F329 Major depressive disorder, single episode, unspecified: Secondary | ICD-10-CM | POA: Diagnosis present

## 2017-05-21 DIAGNOSIS — R1013 Epigastric pain: Secondary | ICD-10-CM | POA: Diagnosis present

## 2017-05-21 DIAGNOSIS — N401 Enlarged prostate with lower urinary tract symptoms: Secondary | ICD-10-CM | POA: Diagnosis present

## 2017-05-21 DIAGNOSIS — I10 Essential (primary) hypertension: Secondary | ICD-10-CM | POA: Diagnosis present

## 2017-05-21 DIAGNOSIS — K859 Acute pancreatitis without necrosis or infection, unspecified: Secondary | ICD-10-CM | POA: Diagnosis present

## 2017-05-21 DIAGNOSIS — F1010 Alcohol abuse, uncomplicated: Secondary | ICD-10-CM | POA: Diagnosis present

## 2017-05-21 DIAGNOSIS — K760 Fatty (change of) liver, not elsewhere classified: Secondary | ICD-10-CM | POA: Diagnosis present

## 2017-05-21 DIAGNOSIS — Z87442 Personal history of urinary calculi: Secondary | ICD-10-CM

## 2017-05-21 DIAGNOSIS — G894 Chronic pain syndrome: Secondary | ICD-10-CM | POA: Diagnosis present

## 2017-05-21 DIAGNOSIS — F419 Anxiety disorder, unspecified: Secondary | ICD-10-CM | POA: Diagnosis present

## 2017-05-21 DIAGNOSIS — Z800 Family history of malignant neoplasm of digestive organs: Secondary | ICD-10-CM | POA: Diagnosis not present

## 2017-05-21 DIAGNOSIS — Z833 Family history of diabetes mellitus: Secondary | ICD-10-CM | POA: Diagnosis not present

## 2017-05-21 DIAGNOSIS — E785 Hyperlipidemia, unspecified: Secondary | ICD-10-CM | POA: Diagnosis present

## 2017-05-21 DIAGNOSIS — K5900 Constipation, unspecified: Secondary | ICD-10-CM | POA: Diagnosis present

## 2017-05-21 DIAGNOSIS — M1990 Unspecified osteoarthritis, unspecified site: Secondary | ICD-10-CM | POA: Diagnosis present

## 2017-05-21 DIAGNOSIS — F17210 Nicotine dependence, cigarettes, uncomplicated: Secondary | ICD-10-CM | POA: Diagnosis present

## 2017-05-21 DIAGNOSIS — K852 Alcohol induced acute pancreatitis without necrosis or infection: Secondary | ICD-10-CM | POA: Diagnosis present

## 2017-05-21 DIAGNOSIS — Z888 Allergy status to other drugs, medicaments and biological substances status: Secondary | ICD-10-CM

## 2017-05-21 DIAGNOSIS — R17 Unspecified jaundice: Secondary | ICD-10-CM | POA: Diagnosis not present

## 2017-05-21 LAB — COMPREHENSIVE METABOLIC PANEL
ALBUMIN: 3.9 g/dL (ref 3.5–5.0)
ALK PHOS: 66 U/L (ref 38–126)
ALT: 47 U/L (ref 17–63)
ANION GAP: 10 (ref 5–15)
AST: 40 U/L (ref 15–41)
BILIRUBIN TOTAL: 1.3 mg/dL — AB (ref 0.3–1.2)
BUN: 16 mg/dL (ref 6–20)
CALCIUM: 8.7 mg/dL — AB (ref 8.9–10.3)
CO2: 22 mmol/L (ref 22–32)
CREATININE: 1.24 mg/dL (ref 0.61–1.24)
Chloride: 100 mmol/L — ABNORMAL LOW (ref 101–111)
GFR calc Af Amer: >60 mL/min (ref >60)
GFR calc non Af Amer: >60 mL/min (ref >60)
Glucose, Bld: 95 mg/dL (ref 65–99)
Potassium: 3.7 mmol/L (ref 3.5–5.1)
Sodium: 132 mmol/L — ABNORMAL LOW (ref 135–145)
TOTAL PROTEIN: 8.1 g/dL (ref 6.5–8.1)

## 2017-05-21 LAB — CBC
HCT: 41.7 % (ref 40.0–52.0)
HEMOGLOBIN: 14.6 g/dL (ref 13.0–18.0)
MCH: 31.3 pg (ref 26.0–34.0)
MCHC: 35 g/dL (ref 32.0–36.0)
MCV: 89.5 fL (ref 80.0–100.0)
PLATELETS: 261 10*3/uL (ref 150–440)
RBC: 4.66 MIL/uL (ref 4.40–5.90)
RDW: 13.7 % (ref 11.5–14.5)
WBC: 10.3 10*3/uL (ref 3.8–10.6)

## 2017-05-21 LAB — LIPASE, BLOOD
LIPASE: 144 U/L — AB (ref 11–51)
Lipase: 182 U/L — ABNORMAL HIGH (ref 11–51)

## 2017-05-21 MED ORDER — FENTANYL CITRATE (PF) 100 MCG/2ML IJ SOLN: 50.0000 ug | INTRAMUSCULAR | Status: DC | PRN

## 2017-05-21 MED ORDER — MORPHINE SULFATE (PF) 4 MG/ML IV SOLN
4.0000 mg | INTRAVENOUS | Status: DC | PRN
  Administered 2017-05-21: 4 mg via INTRAVENOUS
  Filled 2017-05-21: qty 1

## 2017-05-21 MED ORDER — PANTOPRAZOLE SODIUM 40 MG IV SOLR
40.0000 mg | Freq: Two times a day (BID) | INTRAVENOUS | Status: DC
  Administered 2017-05-21 – 2017-05-23 (×4): 40 mg via INTRAVENOUS
  Filled 2017-05-21 (×5): qty 40

## 2017-05-21 MED ORDER — ONDANSETRON HCL 4 MG/2ML IJ SOLN: 4.0000 mg | Freq: Four times a day (QID) | INTRAMUSCULAR | Status: DC | PRN

## 2017-05-21 MED ORDER — MIRABEGRON ER 25 MG PO TB24
25.0000 mg | ORAL_TABLET | Freq: Every day | ORAL | Status: DC
  Administered 2017-05-21 – 2017-05-23 (×3): 25 mg via ORAL
  Filled 2017-05-21 (×3): qty 1

## 2017-05-21 MED ORDER — ACETAMINOPHEN 325 MG PO TABS
650.0000 mg | ORAL_TABLET | Freq: Four times a day (QID) | ORAL | Status: DC | PRN
  Administered 2017-05-21: 650 mg via ORAL
  Filled 2017-05-21: qty 2

## 2017-05-21 MED ORDER — OXYCODONE-ACETAMINOPHEN 5-325 MG PO TABS
1.0000 | ORAL_TABLET | Freq: Four times a day (QID) | ORAL | Status: DC | PRN
  Administered 2017-05-22 – 2017-05-23 (×2): 1 via ORAL
  Filled 2017-05-21 (×2): qty 1

## 2017-05-21 MED ORDER — MORPHINE SULFATE (PF) 2 MG/ML IV SOLN
2.0000 mg | INTRAVENOUS | Status: DC | PRN
  Administered 2017-05-21: 2 mg via INTRAVENOUS
  Filled 2017-05-21: qty 1

## 2017-05-21 MED ORDER — MORPHINE SULFATE (PF) 2 MG/ML IV SOLN
2.0000 mg | INTRAVENOUS | Status: DC | PRN
  Administered 2017-05-22 – 2017-05-23 (×3): 2 mg via INTRAVENOUS
  Filled 2017-05-21 (×3): qty 1

## 2017-05-21 MED ORDER — SODIUM CHLORIDE 0.9 % IV SOLN
INTRAVENOUS | Status: DC
  Administered 2017-05-21 – 2017-05-22 (×2): via INTRAVENOUS

## 2017-05-21 MED ORDER — FLUOXETINE HCL 20 MG PO CAPS
20.0000 mg | ORAL_CAPSULE | Freq: Every day | ORAL | Status: DC
  Administered 2017-05-21 – 2017-05-23 (×3): 20 mg via ORAL
  Filled 2017-05-21 (×3): qty 1

## 2017-05-21 MED ORDER — ENOXAPARIN SODIUM 40 MG/0.4ML ~~LOC~~ SOLN
40.0000 mg | SUBCUTANEOUS | Status: DC
  Administered 2017-05-21: 40 mg via SUBCUTANEOUS
  Filled 2017-05-21: qty 0.4

## 2017-05-21 MED ORDER — PROMETHAZINE HCL 25 MG/ML IJ SOLN: 12.5000 mg | Freq: Four times a day (QID) | INTRAMUSCULAR | Status: DC | PRN

## 2017-05-21 MED ORDER — HYDRALAZINE HCL 50 MG PO TABS
25.0000 mg | ORAL_TABLET | Freq: Three times a day (TID) | ORAL | Status: DC
  Administered 2017-05-21 – 2017-05-23 (×4): 25 mg via ORAL
  Filled 2017-05-21 (×4): qty 1

## 2017-05-21 MED ORDER — GABAPENTIN 300 MG PO CAPS
300.0000 mg | ORAL_CAPSULE | Freq: Two times a day (BID) | ORAL | Status: DC
  Administered 2017-05-21 – 2017-05-23 (×4): 300 mg via ORAL
  Filled 2017-05-21 (×4): qty 1

## 2017-05-21 MED ORDER — TAMSULOSIN HCL 0.4 MG PO CAPS
0.8000 mg | ORAL_CAPSULE | Freq: Every day | ORAL | Status: DC
  Administered 2017-05-21 – 2017-05-23 (×3): 0.8 mg via ORAL
  Filled 2017-05-21 (×3): qty 2

## 2017-05-21 MED ORDER — ACETAMINOPHEN 650 MG RE SUPP: 650.0000 mg | Freq: Four times a day (QID) | RECTAL | Status: DC | PRN

## 2017-05-21 MED ORDER — SODIUM CHLORIDE 0.9 % IV BOLUS
1000.0000 mL | Freq: Once | INTRAVENOUS | Status: AC
  Administered 2017-05-21: 1000 mL via INTRAVENOUS

## 2017-05-21 MED ORDER — ONDANSETRON HCL 4 MG PO TABS: 4.0000 mg | ORAL_TABLET | Freq: Four times a day (QID) | ORAL | Status: DC | PRN

## 2017-05-21 NOTE — ED Provider Notes (Signed)
[facility] Emergency Department Provider Note    First MD Initiated Contact with Patient 05/21/17 1440     (approximate)  I have reviewed the triage vital signs and the nursing notes.   HISTORY  Chief Complaint Abdominal Pain    HPI Carlos Reynolds is a 62 y.o. male presents to the ER one day after being seen for left upper quadrant abdominal pain radiating through to his back associated with some nausea but no vomiting.  Patient was diagnosed with mild pancreatitis yesterday.  He was able to tolerate oral hydration his pain was controlled so he decided to go home after being offered admission the hospital.  Patient returns that ER today with persistent pain.  Currently rates it as moderate to severe and nonradiating.  Worse with eating.  Denies any chest pain or shortness of breath at this time.  Past Medical History:  Diagnosis Date  . Anxiety   . Arthritis   . Depression   . Heartburn   . HLD (hyperlipidemia)   . Hypertension   . Kidney stone    Family History  Problem Relation Age of Onset  . Heart disease Mother   . Colon cancer Mother   . Diabetes Father   . Prostate cancer Father   . Colon cancer Brother   . Heart disease Brother   . Kidney cancer Neg Hx   . Bladder Cancer Neg Hx    Past Surgical History:  Procedure Laterality Date  . COLONOSCOPY    . COLONOSCOPY WITH PROPOFOL N/A 04/15/2017   Procedure: COLONOSCOPY WITH PROPOFOL;  Surgeon: Wyline Mood, MD;  Location: [facility] ENDOSCOPY;  Service: Gastroenterology;  Laterality: N/A;  . NO PAST SURGERIES     verified with patient on 01/15/17   Patient Active Problem List   Diagnosis Date Noted  . Essential hypertension 03/25/2017  . Gastroesophageal reflux disease 03/25/2017  . Depression 01/21/2017  . Chronic left shoulder pain 01/21/2017  . Chronic pain syndrome 01/21/2017  . 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 Benign prostatic hyperplasia with incomplete bladder emptying  11/21/2016  . Erectile dysfunction 11/21/2016  . Current moderate episode of major depressive disorder without prior episode (HCC) 11/21/2016  . Eczema 11/21/2016      Prior to Admission medications   Medication Sig Start Date End Date Taking? Authorizing Provider  diclofenac (VOLTAREN) 75 MG EC tablet Take 1 tablet (75 mg total) by mouth 2 (two) times daily. 03/18/17  Yes Galen Manila, NP  FLUoxetine (PROZAC) 20 MG capsule Take 1 capsule (20 mg total) by mouth daily. 03/18/17  Yes Galen Manila, NP  gabapentin (NEURONTIN) 300 MG capsule Take 1 capsule (300 mg total) by mouth 2 (two) times daily. 05/01/17  Yes Edward Jolly, MD  hydrochlorothiazide (HYDRODIURIL) 25 MG tablet Take 1 tablet (25 mg total) by mouth daily. 03/18/17  Yes Galen Manila, NP  HYDROcodone-acetaminophen (NORCO) 7.5-325 MG tablet Take 1 tablet by mouth 2 (two) times daily as needed for moderate pain. 04/17/17  Yes Edward Jolly, MD  mirabegron ER (MYRBETRIQ) 25 MG TB24 tablet Take 25 mg by mouth daily.   Yes [provider]  omeprazole (PRILOSEC) 20 MG capsule TAKE 1 CAPSULE (20 MG) BY MOUTH ONCE DAILY 03/18/17  Yes Galen Manila, NP  oxyCODONE (ROXICODONE) 5 MG immediate release tablet Take 1 tablet (5 mg total) by mouth every 8 (eight) hours as needed. 05/20/17 05/20/18 Yes Arnaldo Natal, MD  sildenafil (REVATIO) 20 MG tablet Take  3 to 5 tablets two hours before intercouse on an empty stomach.  Do not take with nitrates. 05/01/17  Yes McGowan, Carollee HerterShannon A, PA-C  tamsulosin (FLOMAX) 0.4 MG CAPS capsule Take 2 capsules (0.8 mg total) by mouth daily. 03/25/17  Yes Galen ManilaKennedy, Lauren Renee, NP  amoxicillin-clavulanate (AUGMENTIN) 875-125 MG tablet Take 1 tablet by mouth every 12 (twelve) hours. Patient not taking: Reported on 05/01/2017 04/22/17   Michiel CowboyMcGowan, Shannon A, PA-C  diazepam (VALIUM) 2 MG tablet Take 1 tablet (2 mg total) by mouth once as needed for up to 1 dose for anxiety. As needed for  MRI. Patient not taking: Reported on 05/01/2017 04/21/17   Edward JollyLateef, Bilal, MD    Allergies Shellfish allergy; Tape; Amlodipine; and Lisinopril    Social History Social History   Tobacco Use  . Smoking status: Current Every Day Smoker    Packs/day: 0.50    Years: 46.00    Pack years: 23.00    Types: Cigarettes  . Smokeless tobacco: Never Used  Substance Use Topics  . Alcohol use: Yes    Alcohol/week: 8.4 oz    Types: 14 Shots of liquor per week    Comment: 2 drinks of Liquor daily  . Drug use: No    Review of Systems Patient denies headaches, rhinorrhea, blurry vision, numbness, shortness of breath, chest pain, edema, cough, abdominal pain, nausea, vomiting, diarrhea, dysuria, fevers, rashes or hallucinations unless otherwise stated above in HPI. ____________________________________________   PHYSICAL EXAM:  VITAL SIGNS: Vitals:   05/21/17 1431  BP: (!) 126/97  Pulse: 79  Temp: 98.7 F (37.1 C)  SpO2: 96%    Constitutional: Alert and oriented. in no acute distress. Eyes: Conjunctivae are normal.  Head: Atraumatic. Nose: No congestion/rhinnorhea. Mouth/Throat: Mucous membranes are moist.   Neck: No stridor. Painless ROM.  Cardiovascular: Normal rate, regular rhythm. Grossly normal heart sounds.  Good peripheral circulation. Respiratory: Normal respiratory effort.  No retractions. Lungs CTAB. Gastrointestinal: Soft and nontender. No distention. No abdominal bruits. No CVA tenderness. Genitourinary:  Musculoskeletal: No lower extremity tenderness nor edema.  No joint effusions. Neurologic:  Normal speech and language. No gross focal neurologic deficits are appreciated. No facial droop Skin:  Skin is warm, dry and intact. No rash noted. Psychiatric: Mood and affect are normal. Speech and behavior are normal.  ____________________________________________   LABS (all labs ordered are listed, but only abnormal results are displayed)  Results for orders placed or  performed during the hospital encounter of 05/21/17 (from the past 24 hour(s))  Lipase, blood     Status: Abnormal   Collection Time: 05/21/17  2:41 PM  Result Value Ref Range   Lipase 182 (H) 11 - 51 U/L  Comprehensive metabolic panel     Status: Abnormal   Collection Time: 05/21/17  2:41 PM  Result Value Ref Range   Sodium 132 (L) 135 - 145 mmol/L   Potassium 3.7 3.5 - 5.1 mmol/L   Chloride 100 (L) 101 - 111 mmol/L   CO2 22 22 - 32 mmol/L   Glucose, Bld 95 65 - 99 mg/dL   BUN 16 6 - 20 mg/dL   Creatinine, Ser 1.471.24 0.61 - 1.24 mg/dL   Calcium 8.7 (L) 8.9 - 10.3 mg/dL   Total Protein 8.1 6.5 - 8.1 g/dL   Albumin 3.9 3.5 - 5.0 g/dL   AST 40 15 - 41 U/L   ALT 47 17 - 63 U/L   Alkaline Phosphatase 66 38 - 126 U/L   Total Bilirubin  1.3 (H) 0.3 - 1.2 mg/dL   GFR calc non Af Amer >60 >60 mL/min   GFR calc Af Amer >60 >60 mL/min   Anion gap 10 5 - 15  CBC     Status: None   Collection Time: 05/21/17  2:41 PM  Result Value Ref Range   WBC 10.3 3.8 - 10.6 K/uL   RBC 4.66 4.40 - 5.90 MIL/uL   Hemoglobin 14.6 13.0 - 18.0 g/dL   HCT 16.1 09.6 - 04.5 %   MCV 89.5 80.0 - 100.0 fL   MCH 31.3 26.0 - 34.0 pg   MCHC 35.0 32.0 - 36.0 g/dL   RDW 40.9 81.1 - 91.4 %   Platelets 261 150 - 440 K/uL   ____________________________________________  EKG My review and personal interpretation at Time: 7:53   Indication: epigastric pain  Rate: 70  Rhythm: sinus Axis: normal Other: normal intervals, no stemi ____________________________________________  RADIOLOGY  I personally reviewed all radiographic images ordered to evaluate for the above acute complaints and reviewed radiology reports and findings.  These findings were personally discussed with the patient.  Please see medical record for radiology report.  ____________________________________________   PROCEDURES  Procedure(s) performed:  Procedures    Critical Care performed:  no ____________________________________________   INITIAL IMPRESSION / ASSESSMENT AND PLAN / ED COURSE  Pertinent labs & imaging results that were available during my care of the patient were reviewed by me and considered in my medical decision making (see chart for details).  DDX: pancreatitis, stone, sbo, dehydration  Carlos Reynolds is a 62 y.o. who presents to the ED with symptoms as described above.  Patient with probable pancreatitis ultrasound ordered to exclude biliary obstruction given his interval worsening of elevated bilirubin.  Possible dehydration.  Do not feel that repeat CT imaging clinically indicated at this time.  He denies any alcohol use and certainly none recently.  Based on his presentation failing outpatient management do feel patient will require hospitalization for IV fluids as well as IV pain medication with bowel rest.      As part of my medical decision making, I reviewed the following data within the electronic MEDICAL RECORD NUMBER Nursing notes reviewed and incorporated, Labs reviewed, notes from prior ED visits.   ____________________________________________   FINAL CLINICAL IMPRESSION(S) / ED DIAGNOSES  Final diagnoses:  Elevated lipase  Epigastric pain      NEW MEDICATIONS STARTED DURING THIS VISIT:  New Prescriptions   No medications on file     Note:  This document was prepared using Dragon voice recognition software and may include unintentional dictation errors.    Willy Eddy, MD 05/21/17 [phone]

## 2017-05-21 NOTE — ED Triage Notes (Signed)
Pt reports that he was here yesterday and was diagnosed with Pancreatitis.  He states that they gave him Morphine and Dilaudid and that he felt better yesterday and decided to go home. Pt reports that his abd began hurting again. He called his PMD and they told him to come back to the ER.

## 2017-05-21 NOTE — H&P (Signed)
[facility] - [place] at [facility]   PATIENT NAME: Carlos Reynolds    MR#:  [id number]  DATE OF BIRTH:  [birth year]  DATE OF ADMISSION:  05/21/2017  PRIMARY CARE PHYSICIAN: Galen Manila, NP   REQUESTING/REFERRING PHYSICIAN: Willy Eddy  CHIEF COMPLAINT: Abdominal pain   Chief Complaint  Patient presents with  . Abdominal Pain    HISTORY OF PRESENT ILLNESS:  Carlos Reynolds  is a 62 y.o. male with a known history of essential hypertension, anxiety, BPH, chronic pain syndrome comes in because of left upper quadrant abdominal pain.  Associated with nausea.  Patient was seen in the emergency room yesterday for abdominal pain, found to have elevated lipase but the patient decided to go home, so patient was discharged from emergency room yesterday.  But patient continued to have abdominal pain associated with nausea.  Lipase elevated at 67 yesterday to 182 today.  Patient told me that he is having abdominal pain since Monday night and not able to keep anything down.  Food makes it worse.  Pain is mainly in the left upper quadrant and radiates to the back.  No fever.  PAST MEDICAL HISTORY:   Past Medical History:  Diagnosis Date  . Anxiety   . Arthritis   . Depression   . Heartburn   . HLD (hyperlipidemia)   . Hypertension   . Kidney stone     PAST SURGICAL HISTOIRY:   Past Surgical History:  Procedure Laterality Date  . COLONOSCOPY    . COLONOSCOPY WITH PROPOFOL N/A 04/15/2017   Procedure: COLONOSCOPY WITH PROPOFOL;  Surgeon: Wyline Mood, MD;  Location: [facility] ENDOSCOPY;  Service: Gastroenterology;  Laterality: N/A;  . NO PAST SURGERIES     verified with patient on 01/15/17    SOCIAL HISTORY:   Social History   Tobacco Use  . Smoking status: Current Every Day Smoker    Packs/day: 0.50    Years: 46.00    Pack years: 23.00    Types: Cigarettes  . Smokeless tobacco: Never Used  Substance Use Topics  . Alcohol use: Yes     Alcohol/week: 8.4 oz    Types: 14 Shots of liquor per week    Comment: 2 drinks of Liquor daily    FAMILY HISTORY:   Family History  Problem Relation Age of Onset  . Heart disease Mother   . Colon cancer Mother   . Diabetes Father   . Prostate cancer Father   . Colon cancer Brother   . Heart disease Brother   . Kidney cancer Neg Hx   . Bladder Cancer Neg Hx     DRUG ALLERGIES:   Allergies  Allergen Reactions  . Shellfish Allergy Hives  . Tape     Brown, elastic tape used by physical therapist; skin breaks out  . Amlodipine Other (See Comments)    Possible hand swelling  . Lisinopril Other (See Comments)    Cough    REVIEW OF SYSTEMS:  CONSTITUTIONAL: No fever, fatigue or weakness.  EYES: No blurred or double vision.  EARS, NOSE, AND THROAT: No tinnitus or ear pain.  RESPIRATORY: No cough, shortness of breath, wheezing or hemoptysis.  CARDIOVASCULAR: No chest pain, orthopnea, edema.  GASTROINTESTINAL: Abdominal pain mainly in the left upper quadrant radiating to the back associated with nausea.   GENITOURINARY: No dysuria, hematuria.  ENDOCRINE: No polyuria, nocturia,  HEMATOLOGY: No anemia, easy bruising or bleeding SKIN: No rash or lesion. MUSCULOSKELETAL: No joint pain or arthritis.  NEUROLOGIC: No tingling, numbness, weakness.  PSYCHIATRY: No anxiety or depression.   MEDICATIONS AT HOME:   Prior to Admission medications   Medication Sig Start Date End Date Taking? Authorizing Provider  diclofenac (VOLTAREN) 75 MG EC tablet Take 1 tablet (75 mg total) by mouth 2 (two) times daily. 03/18/17  Yes Galen Manila, NP  FLUoxetine (PROZAC) 20 MG capsule Take 1 capsule (20 mg total) by mouth daily. 03/18/17  Yes Galen Manila, NP  gabapentin (NEURONTIN) 300 MG capsule Take 1 capsule (300 mg total) by mouth 2 (two) times daily. 05/01/17  Yes Edward Jolly, MD  hydrochlorothiazide (HYDRODIURIL) 25 MG tablet Take 1 tablet (25 mg total) by mouth daily. 03/18/17   Yes Galen Manila, NP  HYDROcodone-acetaminophen (NORCO) 7.5-325 MG tablet Take 1 tablet by mouth 2 (two) times daily as needed for moderate pain. 04/17/17  Yes Edward Jolly, MD  mirabegron ER (MYRBETRIQ) 25 MG TB24 tablet Take 25 mg by mouth daily.   Yes [provider]  omeprazole (PRILOSEC) 20 MG capsule TAKE 1 CAPSULE (20 MG) BY MOUTH ONCE DAILY 03/18/17  Yes Galen Manila, NP  oxyCODONE (ROXICODONE) 5 MG immediate release tablet Take 1 tablet (5 mg total) by mouth every 8 (eight) hours as needed. 05/20/17 05/20/18 Yes Arnaldo Natal, MD  sildenafil (REVATIO) 20 MG tablet Take 3 to 5 tablets two hours before intercouse on an empty stomach.  Do not take with nitrates. 05/01/17  Yes McGowan, Carollee Herter A, PA-C  tamsulosin (FLOMAX) 0.4 MG CAPS capsule Take 2 capsules (0.8 mg total) by mouth daily. 03/25/17  Yes Galen Manila, NP  amoxicillin-clavulanate (AUGMENTIN) 875-125 MG tablet Take 1 tablet by mouth every 12 (twelve) hours. Patient not taking: Reported on 05/01/2017 04/22/17   Michiel Cowboy A, PA-C  diazepam (VALIUM) 2 MG tablet Take 1 tablet (2 mg total) by mouth once as needed for up to 1 dose for anxiety. As needed for MRI. Patient not taking: Reported on 05/01/2017 04/21/17   Edward Jolly, MD      VITAL SIGNS:  Blood pressure 119/66, pulse 82, temperature 98.7 F (37.1 C), temperature source Oral, height 5\' 9"  (1.753 m), weight 98.4 kg (217 lb), SpO2 96 %.  PHYSICAL EXAMINATION:  GENERAL:  62 y.o.-year-old patient lying in the bed with no acute distress.  EYES: Pupils equal, round, reactive to light and accommodation. No scleral icterus. Extraocular muscles intact.  HEENT: Head atraumatic, normocephalic. Oropharynx and nasopharynx clear.  NECK:  Supple, no jugular venous distention. No thyroid enlargement, no tenderness.  LUNGS: Normal breath sounds bilaterally, no wheezing, rales,rhonchi or crepitation. No use of accessory muscles of respiration.   CARDIOVASCULAR: S1, S2 normal. No murmurs, rubs, or gallops.  ABDOMEN: Tenderness in left upper quadrant, no rebound tenderness.  No CVA tenderness, no organomegaly.  Bowel sounds diminished. EXTREMITIES: No pedal edema, cyanosis, or clubbing.  NEUROLOGIC: Cranial nerves II through XII are intact. Muscle strength 5/5 in all extremities. Sensation intact. Gait not checked.  PSYCHIATRIC: The patient is alert and oriented x 3.  SKIN: No obvious rash, lesion, or ulcer.   LABORATORY PANEL:   CBC Recent Labs  Lab 05/21/17 1441  WBC 10.3  HGB 14.6  HCT 41.7  PLT 261   ------------------------------------------------------------------------------------------------------------------  Chemistries  Recent Labs  Lab 05/21/17 1441  NA 132*  K 3.7  CL 100*  CO2 22  GLUCOSE 95  BUN 16  CREATININE 1.24  CALCIUM 8.7*  AST 40  ALT 47  ALKPHOS  66  BILITOT 1.3*   ------------------------------------------------------------------------------------------------------------------  Cardiac Enzymes No results for input(s): TROPONINI in the last 168 hours. ------------------------------------------------------------------------------------------------------------------  RADIOLOGY:  Ct Renal Stone Study  Result Date: 05/20/2017 CLINICAL DATA:  Left-sided flank pain today, history of kidney stones EXAM: CT ABDOMEN AND PELVIS WITHOUT CONTRAST TECHNIQUE: Multidetector CT imaging of the abdomen and pelvis was performed following the standard protocol without IV contrast. COMPARISON:  CT abdomen pelvis of 03/10/2013 FINDINGS: Lower chest: In the lung bases, there is a 5 mm noncalcified nodule within the lingula on image 1. No additional abnormality is seen within the lung bases. No effusion is seen. Heart is mildly enlarged. No pericardial effusion is seen. Hepatobiliary: The liver is diffusely low in attenuation consistent with fatty infiltration diffusely. Some sparing is noted near the gallbladder.  No calcified gallstones are seen. Pancreas: The pancreas is normal in size and the pancreatic duct is not dilated. Better seen on coronal images there is slight peripancreatic strandiness along the distal aspect of the pancreas of questionable significance. Very mild pancreatitis would be difficult to exclude with this exam. Spleen: The spleen is unremarkable. Adrenals/Urinary Tract: No renal calculi are seen. There is no evidence of hydronephrosis. The ureters appear normal in caliber. The urinary bladder is moderately urine distended with no abnormality noted. Stomach/Bowel: The stomach is not well distended but no abnormality is evident. No small bowel distention is seen. Much of the colon is decompressed and cannot be evaluated. No evidence of diverticulitis is seen. The terminal ileum and the appendix are well visualized with no abnormality noted. Vascular/Lymphatic: The abdominal aorta is normal in caliber with mild abdominal aortic atherosclerosis present. No adenopathy is seen. Reproductive: The prostate is normal in size. Other: Some fat does enter both inguinal canals left-greater-than-right. Musculoskeletal: Mild degenerative changes are present in both hips. The SI joints are unremarkable. The lumbar vertebrae are normal alignment with normal intervertebral disc spaces. IMPRESSION: 1. The only questionable abnormality is slight strandiness of the peripancreatic fat planes near the tail of the pancreas. Mild pancreatitis would be difficult to exclude. Correlate clinically. 2. No renal or ureteral calculi are seen. There is no evidence of hydronephrosis. 3. Diffuse fatty infiltration of the liver. 4. The appendix and terminal ileum are unremarkable. Electronically Signed   By: Dwyane Dee M.D.   On: 05/20/2017 09:52   US Abdomen Limited Ruq  Result Date: 05/21/2017 CLINICAL DATA:  Initial evaluation for elevated lipase, mid abdominal pain. EXAM: ULTRASOUND ABDOMEN LIMITED RIGHT UPPER QUADRANT  COMPARISON:  Prior CT from 05/20/2017. FINDINGS: Gallbladder: No gallstones or wall thickening visualized. No sonographic Murphy sign noted by sonographer. Common bile duct: Diameter: 4.8 mm Liver: No focal lesion identified. Diffusely increased echogenicity, compatible with steatosis. Portal vein is patent on color Doppler imaging with normal direction of blood flow towards the liver. IMPRESSION: 1. Normal sonographic appearance of the gallbladder. No cholelithiasis or evidence for acute cholecystitis. No biliary dilatation. 2. Hepatic steatosis. Electronically Signed   By: Rise Mu M.D.   On: 05/21/2017 16:26    EKG:   Orders placed or performed during the hospital encounter of 05/20/17  . ED EKG  . ED EKG  . EKG 12-Lead  . EKG 12-Lead  . EKG    IMPRESSION AND PLAN:  62 year old male patient with multiple medical problems of hypertension, BPH, chronic pain, anxiety, tobacco abuse, alcohol abuse comes in with abdominal pain left upper quadrant, elevated lipase.  Patient states that he does not get any withdrawal symptoms,  drinks about 4 ounces of whiskey a day, not able to drink for last few days and he does not feel any withdrawal symptoms and he does not feel the urge to drink alcohol.  #1 acute idiopathic pancreatitis.  Lipase elevated to 182 with normal LFTs; continue clear liquids, IV fluids, IV morphine, IV PPIs and IV fluids, obtain GI consult for evaluation of pancreatitis patient may need MRCP. 2.  Essential hypertension: Controlled, hold HCTZ because of acute pancreatitis and need for IV hydration, continue hydralazine for BP control. 3.  BPH: Continue Flomax #4 history of depression: Patient is on Prozac, Neurontin. 4.  History of incomplete bladder emptying, started on Myrbetriq recently by urologist a month back. All the records are reviewed and case discussed with ED provider. Management plans discussed with the patient, family and they are in agreement.  CODE  STATUS: full   TOTAL TIME TAKING CARE OF THIS PATIENT: 55 minutes.    Katha HammingSnehalatha Scarlett Portlock M.D on 05/21/2017 at 5:47 PM  Between 7am to 6pm - Pager - [phone]  After 6pm go to [email or web address] - password [email or web address] [facility]  [facility]  Office  [phone]  CC: Primary care physician; Galen ManilaKennedy, Lauren Renee, NP  Note: This dictation was prepared with Dragon dictation along with smaller phrase technology. Any transcriptional errors that result from this process are unintentional.

## 2017-05-21 NOTE — Progress Notes (Signed)
Per MD okay for RN to modified pain medications.

## 2017-05-21 NOTE — ED Notes (Signed)
Called to room by pt family - pt c/o not being able to breath - O2 sat 97% with good wave form - pt states he has a sharp pain in his left upper abd and that this pain makes him feel like he cannot breath - pt was repositioned and head of bed raised - given pillow for splinting when pain occurs - pt states this has helped and that he is able to breath easier - pt appears in NAD at this time

## 2017-05-21 NOTE — Telephone Encounter (Signed)
Covering inbox for Wilhelmina McardleLauren Kennedy, AGPCNP-BC while she is out of office.  Received a fax notice of after hours call from patient, after he was seen in ED yesterday 05/22/17 for Acute Pancreatitis, reviewed chart, he was treated ultimately with oxycodone PRN and given dietary advice and symptom control, at that time his symptoms were more mild and able to be controlled in ED upon discharge for continued treatment of pain at home.  Now he calls with significant worsening of symptoms, unable to eat or drink due to pain, severe pain, some improved on medicine but seems it is not controlling enough and worse in 24 hours.  I have not seen him in office or evaluated him, based on ED report which requested he return if worse, and his symptoms, I advised [facility]. He understood and agreed. I have then called [facility] ED nurse triage to review this patient with them to expect him.  Saralyn PilarAlexander Karamalegos, DO [facility] 05/21/2017, 1:26 PM

## 2017-05-22 ENCOUNTER — Ambulatory Visit: Payer: No Typology Code available for payment source | Admitting: Urology

## 2017-05-22 DIAGNOSIS — R17 Unspecified jaundice: Secondary | ICD-10-CM

## 2017-05-22 DIAGNOSIS — K852 Alcohol induced acute pancreatitis without necrosis or infection: Principal | ICD-10-CM

## 2017-05-22 LAB — CBC
HEMATOCRIT: 39.1 % — AB (ref 40.0–52.0)
Hemoglobin: 13.2 g/dL (ref 13.0–18.0)
MCH: 30.5 pg (ref 26.0–34.0)
MCHC: 33.8 g/dL (ref 32.0–36.0)
MCV: 90.3 fL (ref 80.0–100.0)
PLATELETS: 234 10*3/uL (ref 150–440)
RBC: 4.33 MIL/uL — ABNORMAL LOW (ref 4.40–5.90)
RDW: 14.3 % (ref 11.5–14.5)
WBC: 9.3 10*3/uL (ref 3.8–10.6)

## 2017-05-22 LAB — COMPREHENSIVE METABOLIC PANEL WITH GFR
ALT: 41 U/L (ref 17–63)
AST: 35 U/L (ref 15–41)
Albumin: 3.3 g/dL — ABNORMAL LOW (ref 3.5–5.0)
Alkaline Phosphatase: 58 U/L (ref 38–126)
Anion gap: 9 (ref 5–15)
BUN: 15 mg/dL (ref 6–20)
CO2: 23 mmol/L (ref 22–32)
Calcium: 8.2 mg/dL — ABNORMAL LOW (ref 8.9–10.3)
Chloride: 103 mmol/L (ref 101–111)
Creatinine, Ser: 1.14 mg/dL (ref 0.61–1.24)
GFR calc Af Amer: >60 mL/min
GFR calc non Af Amer: >60 mL/min
Glucose, Bld: 79 mg/dL (ref 65–99)
Potassium: 3.7 mmol/L (ref 3.5–5.1)
Sodium: 135 mmol/L (ref 135–145)
Total Bilirubin: 1.5 mg/dL — ABNORMAL HIGH (ref 0.3–1.2)
Total Protein: 7 g/dL (ref 6.5–8.1)

## 2017-05-22 LAB — GLUCOSE, CAPILLARY: GLUCOSE-CAPILLARY: 88 mg/dL (ref 65–99)

## 2017-05-22 MED ORDER — LORAZEPAM 1 MG PO TABS: 1.0000 mg | ORAL_TABLET | Freq: Four times a day (QID) | ORAL | Status: DC | PRN

## 2017-05-22 MED ORDER — VITAMIN B-1 100 MG PO TABS: 100.0000 mg | ORAL_TABLET | Freq: Every day | ORAL | Status: DC

## 2017-05-22 MED ORDER — NICOTINE 14 MG/24HR TD PT24
14.0000 mg | MEDICATED_PATCH | Freq: Every day | TRANSDERMAL | Status: DC
  Administered 2017-05-22 – 2017-05-23 (×2): 14 mg via TRANSDERMAL
  Filled 2017-05-22 (×2): qty 1

## 2017-05-22 MED ORDER — LACTATED RINGERS IV SOLN
INTRAVENOUS | Status: DC
  Administered 2017-05-22 – 2017-05-23 (×3): via INTRAVENOUS

## 2017-05-22 MED ORDER — VITAMIN B-1 100 MG PO TABS
100.0000 mg | ORAL_TABLET | Freq: Every day | ORAL | Status: DC
  Administered 2017-05-22 – 2017-05-23 (×2): 100 mg via ORAL
  Filled 2017-05-22 (×2): qty 1

## 2017-05-22 MED ORDER — LORAZEPAM 2 MG PO TABS: 0.0000 mg | ORAL_TABLET | Freq: Four times a day (QID) | ORAL | Status: DC

## 2017-05-22 MED ORDER — LORAZEPAM 2 MG/ML IJ SOLN: 1.0000 mg | Freq: Four times a day (QID) | INTRAMUSCULAR | Status: DC | PRN

## 2017-05-22 MED ORDER — ADULT MULTIVITAMIN W/MINERALS CH
1.0000 | ORAL_TABLET | Freq: Every day | ORAL | Status: DC
  Administered 2017-05-22 – 2017-05-23 (×2): 1 via ORAL
  Filled 2017-05-22 (×2): qty 1

## 2017-05-22 MED ORDER — FOLIC ACID 1 MG PO TABS: 1.0000 mg | ORAL_TABLET | Freq: Every day | ORAL | Status: DC

## 2017-05-22 MED ORDER — FOLIC ACID 1 MG PO TABS
1.0000 mg | ORAL_TABLET | Freq: Every day | ORAL | Status: DC
  Administered 2017-05-22 – 2017-05-23 (×2): 1 mg via ORAL
  Filled 2017-05-22 (×2): qty 1

## 2017-05-22 MED ORDER — LORAZEPAM 2 MG PO TABS: 0.0000 mg | ORAL_TABLET | Freq: Two times a day (BID) | ORAL | Status: DC

## 2017-05-22 MED ORDER — THIAMINE HCL 100 MG/ML IJ SOLN
100.0000 mg | Freq: Every day | INTRAMUSCULAR | Status: DC
  Filled 2017-05-22: qty 2

## 2017-05-22 NOTE — Consult Note (Signed)
Vonda Antigua, MD [address], [place], [place], [place], [address] [id number], [place], [place], [place], [address] Phone: [phone]  Fax: [phone]  Consultation  Referring Provider:     Dr. Estanislado Pandy Primary Care Physician:  Mikey College, NP Reason for Consultation:    Abdominal pain, pancreatitis  Date of Admission:  05/21/2017 Date of Consultation:  05/22/2017         HPI:   Carlos Reynolds is a 61 y.o. male with history of daily alcohol use at home, presents with abdominal pain, left upper quadrant, 10/10, for 3 or 4 days sharp, with radiation to the back, not associated with nausea or vomiting, constant, not associated with any fever chills.  Patient's lipase was noted to be elevated to 180.  No previous episodes of pancreatitis.  Ultrasound shows no gallstones or wall thickening of the gallbladder.  Common bile duct of 4.8 mm which is normal in size.  Hepatic steatosis is mention as well. Labs show mildly elevated bilirubin to 1.5.  Transaminases and alk phos are normal.  Hemoglobin is normal at 13.2.  Past Medical History:  Diagnosis Date  . Anxiety   . Arthritis   . Depression   . Heartburn   . HLD (hyperlipidemia)   . Hypertension   . Kidney stone     Past Surgical History:  Procedure Laterality Date  . COLONOSCOPY    . COLONOSCOPY WITH PROPOFOL N/A 04/15/2017   Procedure: COLONOSCOPY WITH PROPOFOL;  Surgeon: Jonathon Bellows, MD;  Location: [facility] ENDOSCOPY;  Service: Gastroenterology;  Laterality: N/A;  . NO PAST SURGERIES     verified with patient on 01/15/17    Prior to Admission medications   Medication Sig Start Date End Date Taking? Authorizing Provider  diclofenac (VOLTAREN) 75 MG EC tablet Take 1 tablet (75 mg total) by mouth 2 (two) times daily. 03/18/17  Yes Mikey College, NP  FLUoxetine (PROZAC) 20 MG capsule Take 1 capsule (20 mg total) by mouth daily. 03/18/17  Yes Mikey College, NP  gabapentin (NEURONTIN) 300 MG  capsule Take 1 capsule (300 mg total) by mouth 2 (two) times daily. 05/01/17  Yes Gillis Santa, MD  hydrochlorothiazide (HYDRODIURIL) 25 MG tablet Take 1 tablet (25 mg total) by mouth daily. 03/18/17  Yes Mikey College, NP  HYDROcodone-acetaminophen (NORCO) 7.5-325 MG tablet Take 1 tablet by mouth 2 (two) times daily as needed for moderate pain. 04/17/17  Yes Gillis Santa, MD  mirabegron ER (MYRBETRIQ) 25 MG TB24 tablet Take 25 mg by mouth daily.   Yes [provider]  omeprazole (PRILOSEC) 20 MG capsule TAKE 1 CAPSULE (20 MG) BY MOUTH ONCE DAILY 03/18/17  Yes Mikey College, NP  oxyCODONE (ROXICODONE) 5 MG immediate release tablet Take 1 tablet (5 mg total) by mouth every 8 (eight) hours as needed. 05/20/17 05/20/18 Yes Nena Polio, MD  sildenafil (REVATIO) 20 MG tablet Take 3 to 5 tablets two hours before intercouse on an empty stomach.  Do not take with nitrates. 05/01/17  Yes McGowan, Larene Beach A, PA-C  tamsulosin (FLOMAX) 0.4 MG CAPS capsule Take 2 capsules (0.8 mg total) by mouth daily. 03/25/17  Yes Mikey College, NP  amoxicillin-clavulanate (AUGMENTIN) 875-125 MG tablet Take 1 tablet by mouth every 12 (twelve) hours. Patient not taking: Reported on 05/01/2017 04/22/17   Zara Council A, PA-C  diazepam (VALIUM) 2 MG tablet Take 1 tablet (2 mg total) by mouth once as needed for up to 1 dose for  anxiety. As needed for MRI. Patient not taking: Reported on 05/01/2017 04/21/17   Gillis Santa, MD    Family History  Problem Relation Age of Onset  . Heart disease Mother   . Colon cancer Mother   . Diabetes Father   . Prostate cancer Father   . Colon cancer Brother   . Heart disease Brother   . Kidney cancer Neg Hx   . Bladder Cancer Neg Hx      Social History   Tobacco Use  . Smoking status: Current Every Day Smoker    Packs/day: 0.50    Years: 46.00    Pack years: 23.00    Types: Cigarettes  . Smokeless tobacco: Never Used  Substance Use Topics  . Alcohol  use: Yes    Alcohol/week: 8.4 oz    Types: 14 Shots of liquor per week    Comment: 2 drinks of Liquor daily  . Drug use: No    Allergies as of 05/21/2017 - Review Complete 05/21/2017  Allergen Reaction Noted  . Shellfish allergy Hives 11/18/2016  . Tape  03/18/2017  . Amlodipine Other (See Comments) 10/05/2014  . Lisinopril Other (See Comments) 09/29/2014    Review of Systems:    All systems reviewed and negative except where noted in HPI.   Physical Exam:  Vital signs in last 24 hours: Vitals:   05/21/17 2054 05/21/17 2138 05/21/17 2245 05/22/17 0510  BP: 113/71 139/81  (!) 107/92  Pulse: 83 77  72  Resp: 18 20  (!) 22  Temp: (!) 97.3 F (36.3 C) (!) 100.7 F (38.2 C) 99.1 F (37.3 C) 99 F (37.2 C)  TempSrc:  Oral Oral Oral  SpO2: 97% 95%  100%  Weight:    231 lb 11.3 oz (105.1 kg)  Height:       Last BM Date: 05/19/17 General:   Pleasant, cooperative in NAD Head:  Normocephalic and atraumatic. Eyes:   No icterus.   Conjunctiva pink. PERRLA. Ears:  Normal auditory acuity. Neck:  Supple; no masses or thyroidomegaly Lungs: Respirations even and unlabored. Lungs clear to auscultation bilaterally.   No wheezes, crackles, or rhonchi.  Abdomen:  Soft, nondistended, tender to palpation midepigastric. Normal bowel sounds. No appreciable masses or hepatomegaly.  No rebound or guarding.  Neurologic:  Alert and oriented x3;  grossly normal neurologically. Skin:  Intact without significant lesions or rashes. Cervical Nodes:  No significant cervical adenopathy. Psych:  Alert and cooperative. Normal affect.  LAB RESULTS: Recent Labs    05/20/17 0755 05/21/17 1441 05/22/17 0502  WBC 6.7 10.3 9.3  HGB 14.6 14.6 13.2  HCT 43.4 41.7 39.1*  PLT 272 261 234   BMET Recent Labs    05/20/17 0755 05/21/17 1441 05/22/17 0502  NA 136 132* 135  K 4.0 3.7 3.7  CL 103 100* 103  CO2 23 22 23   GLUCOSE 108* 95 79  BUN 25* 16 15  CREATININE 1.26* 1.24 1.14  CALCIUM 8.9 8.7*  8.2*   LFT Recent Labs    05/20/17 0755  05/22/17 0502  PROT 7.5   < > 7.0  ALBUMIN 3.9   < > 3.3*  AST 42*   < > 35  ALT 57   < > 41  ALKPHOS 64   < > 58  BILITOT 0.7   < > 1.5*  BILIDIR 0.1  --   --   IBILI 0.6  --   --    < > = values in this  interval not displayed.   PT/INR No results for input(s): LABPROT, INR in the last 72 hours.  STUDIES: US Abdomen Limited Ruq  Result Date: 05/21/2017 CLINICAL DATA:  Initial evaluation for elevated lipase, mid abdominal pain. EXAM: ULTRASOUND ABDOMEN LIMITED RIGHT UPPER QUADRANT COMPARISON:  Prior CT from 05/20/2017. FINDINGS: Gallbladder: No gallstones or wall thickening visualized. No sonographic Murphy sign noted by sonographer. Common bile duct: Diameter: 4.8 mm Liver: No focal lesion identified. Diffusely increased echogenicity, compatible with steatosis. Portal vein is patent on color Doppler imaging with normal direction of blood flow towards the liver. IMPRESSION: 1. Normal sonographic appearance of the gallbladder. No cholelithiasis or evidence for acute cholecystitis. No biliary dilatation. 2. Hepatic steatosis. Electronically Signed   By: Jeannine Boga M.D.   On: 05/21/2017 16:26      Impression / Plan:   Carlos Reynolds is a 62 y.o. y/o male with abdominal pain, with daily alcohol use at home, and found to have pancreatitis based on lipase, and clinical exam  No gallstones noted on ultrasound, normal CBD Etiology of pancreatitis is likely alcohol induced No new meds Bilirubin is mildly elevated but alk phos is normal No convincing evidence of biliary duct obstruction Would recommend obtaining MRCP due to the mildly elevated bilirubin to rule out any biliary duct obstruction Continue management for pancreatitis with IV fluids, and pain meds Would recommend lactated ringers at 250-300 cc an hour for 24-48 hours Once abdominal pain improves in 1-2 days, start diet slowly, with clear liquid diet initially and then advance  to low-fat diet slowly Alcohol abstinence encouraged CIWA protocol, and folate thiamine Agree with finding alternative medication to hydrochlorothiazide for his hypertension because it is associated with pancreatitis. Pt has not had a bowel movement in 3 days, likely due to his pain and pancreas inflammation Can start a bowel regimen with miralax daily tomorrow morning.   Thank you for involving me in the care of this patient.      LOS: 1 day   Virgel Manifold, MD  05/22/2017, 10:24 AM

## 2017-05-22 NOTE — Progress Notes (Signed)
[employer] - [place] at [facility]                                                                                                                                                                                  Patient Demographics   Carlos Reynolds, is a 62 y.o. male, DOB - January 20, 1956, ZOX:[id number]  Admit date - 05/21/2017   Admitting Physician Katha Hamming, MD  Outpatient Primary MD for the patient is Galen Manila, NP   LOS - 1  Subjective: Patient seen and evaluated today Abdominal pain present in the epigastric area Has constipation No nausea and vomiting No fever and chills   Review of Systems:   CONSTITUTIONAL: No documented fever. No fatigue, weakness. No weight gain, no weight loss.  EYES: No blurry or double vision.  ENT: No tinnitus. No postnasal drip. No redness of the oropharynx.  RESPIRATORY: No cough, no wheeze, no hemoptysis. No dyspnea.  CARDIOVASCULAR: No chest pain. No orthopnea. No palpitations. No syncope.  GASTROINTESTINAL: No nausea, no vomiting or diarrhea. No abdominal pain. No melena or hematochezia. Has abdominal pain.Has constipation. GENITOURINARY: No dysuria or hematuria.  ENDOCRINE: No polyuria or nocturia. No heat or cold intolerance.  HEMATOLOGY: No anemia. No bruising. No bleeding.  INTEGUMENTARY: No rashes. No lesions.  MUSCULOSKELETAL: No arthritis. No swelling. No gout.  NEUROLOGIC: No numbness, tingling, or ataxia. No seizure-type activity.  PSYCHIATRIC: No anxiety. No insomnia. No ADD.    Vitals:   Vitals:   05/21/17 2138 05/21/17 2245 05/22/17 0510 05/22/17 1100  BP: 139/81  (!) 107/92 116/79  Pulse: 77  72 76  Resp: 20  (!) 22 20  Temp: (!) 100.7 F (38.2 C) 99.1 F (37.3 C) 99 F (37.2 C) 98.5 F (36.9 C)  TempSrc: Oral Oral Oral Oral  SpO2: 95%  100% 100%  Weight:   105.1 kg (231 lb 11.3 oz)   Height:        Wt Readings from Last 3 Encounters:  05/22/17 105.1 kg (231 lb 11.3 oz)   05/20/17 98.4 kg (217 lb)  05/01/17 103.4 kg (228 lb)     Intake/Output Summary (Last 24 hours) at 05/22/2017 1257 Last data filed at 05/22/2017 1056 Gross per 24 hour  Intake 2794.33 ml  Output 550 ml  Net 2244.33 ml    Physical Exam:   GENERAL: Pleasant-appearing in no apparent distress.  HEAD, EYES, EARS, NOSE AND THROAT: Atraumatic, normocephalic. Extraocular muscles are intact. Pupils equal and reactive to light. Sclerae anicteric. No conjunctival injection. No oro-pharyngeal erythema.  NECK: Supple. There is no jugular venous distention. No bruits, no lymphadenopathy, no thyromegaly.  HEART: Regular rate and rhythm,. No murmurs,  no rubs, no clicks.  LUNGS: Clear to auscultation bilaterally. No rales or rhonchi. No wheezes.  ABDOMEN: soft, tenderness in epigastrium, nondistended. Has good bowel sounds. No hepatosplenomegaly appreciated.  EXTREMITIES: No evidence of any cyanosis, clubbing, or peripheral edema.  +2 pedal and radial pulses bilaterally.  NEUROLOGIC: The patient is alert, awake, and oriented x3 with no focal motor or sensory deficits appreciated bilaterally.  SKIN: Moist and warm with no rashes appreciated.  Psych: Not anxious, depressed LN: No inguinal LN enlargement    Antibiotics   Anti-infectives (From admission, onward)   None      Medications   Scheduled Meds: . enoxaparin (LOVENOX) injection  40 mg Subcutaneous Q24H  . FLUoxetine  20 mg Oral Daily  . gabapentin  300 mg Oral BID  . hydrALAZINE  25 mg Oral Q8H  . mirabegron ER  25 mg Oral Daily  . nicotine  14 mg Transdermal Daily  . pantoprazole (PROTONIX) IV  40 mg Intravenous Q12H  . tamsulosin  0.8 mg Oral Daily   Continuous Infusions: . lactated ringers     PRN Meds:.acetaminophen **OR** acetaminophen, fentaNYL (SUBLIMAZE) injection, morphine injection, ondansetron **OR** ondansetron (ZOFRAN) IV, oxyCODONE-acetaminophen, promethazine   Data Review:   Micro Results No results found for  this or any previous visit (from the past 240 hour(s)).  Radiology Reports Mr Shoulder Left Wo Contrast  Result Date: 04/28/2017 CLINICAL DATA:  Chronic left shoulder pain. EXAM: MRI OF THE LEFT SHOULDER WITHOUT CONTRAST TECHNIQUE: Multiplanar, multisequence MR imaging of the shoulder was performed. No intravenous contrast was administered. COMPARISON:  None. FINDINGS: Rotator cuff: There is a deep non retracted partial-thickness bursal surface tear of the distal supraspinatus tendon, 17 mm in width, best seen on images 5 and 6 of series 7 and on images 13 through 16 of series 4. The rotator cuff is otherwise intact. Muscles: No atrophy or abnormal signal of the muscles of the rotator cuff. Biceps long head:  Properly located and intact. Acromioclavicular Joint:  Normal.  Type 2 acromion. No bursitis. Glenohumeral Joint: Normal. Labrum:  Normal. Bones:  Normal. Other: None IMPRESSION: Partial-thickness linear articular surface tear of the anterior aspect of the distal supraspinatus tendon extending most of the way through the tendon. No retraction. Electronically Signed   By: Francene Boyers M.D.   On: 04/28/2017 14:07   Ct Renal Stone Study  Result Date: 05/20/2017 CLINICAL DATA:  Left-sided flank pain today, history of kidney stones EXAM: CT ABDOMEN AND PELVIS WITHOUT CONTRAST TECHNIQUE: Multidetector CT imaging of the abdomen and pelvis was performed following the standard protocol without IV contrast. COMPARISON:  CT abdomen pelvis of 03/10/2013 FINDINGS: Lower chest: In the lung bases, there is a 5 mm noncalcified nodule within the lingula on image 1. No additional abnormality is seen within the lung bases. No effusion is seen. Heart is mildly enlarged. No pericardial effusion is seen. Hepatobiliary: The liver is diffusely low in attenuation consistent with fatty infiltration diffusely. Some sparing is noted near the gallbladder. No calcified gallstones are seen. Pancreas: The pancreas is normal in size  and the pancreatic duct is not dilated. Better seen on coronal images there is slight peripancreatic strandiness along the distal aspect of the pancreas of questionable significance. Very mild pancreatitis would be difficult to exclude with this exam. Spleen: The spleen is unremarkable. Adrenals/Urinary Tract: No renal calculi are seen. There is no evidence of hydronephrosis. The ureters appear normal in caliber. The urinary bladder is moderately urine distended with no abnormality  noted. Stomach/Bowel: The stomach is not well distended but no abnormality is evident. No small bowel distention is seen. Much of the colon is decompressed and cannot be evaluated. No evidence of diverticulitis is seen. The terminal ileum and the appendix are well visualized with no abnormality noted. Vascular/Lymphatic: The abdominal aorta is normal in caliber with mild abdominal aortic atherosclerosis present. No adenopathy is seen. Reproductive: The prostate is normal in size. Other: Some fat does enter both inguinal canals left-greater-than-right. Musculoskeletal: Mild degenerative changes are present in both hips. The SI joints are unremarkable. The lumbar vertebrae are normal alignment with normal intervertebral disc spaces. IMPRESSION: 1. The only questionable abnormality is slight strandiness of the peripancreatic fat planes near the tail of the pancreas. Mild pancreatitis would be difficult to exclude. Correlate clinically. 2. No renal or ureteral calculi are seen. There is no evidence of hydronephrosis. 3. Diffuse fatty infiltration of the liver. 4. The appendix and terminal ileum are unremarkable. Electronically Signed   By: Dwyane DeePaul  Barry M.D.   On: 05/20/2017 09:52   [place] Abdomen Limited Ruq  Result Date: 05/21/2017 CLINICAL DATA:  Initial evaluation for elevated lipase, mid abdominal pain. EXAM: ULTRASOUND ABDOMEN LIMITED RIGHT UPPER QUADRANT COMPARISON:  Prior CT from 05/20/2017. FINDINGS: Gallbladder: No gallstones or wall  thickening visualized. No sonographic Murphy sign noted by sonographer. Common bile duct: Diameter: 4.8 mm Liver: No focal lesion identified. Diffusely increased echogenicity, compatible with steatosis. Portal vein is patent on color Doppler imaging with normal direction of blood flow towards the liver. IMPRESSION: 1. Normal sonographic appearance of the gallbladder. No cholelithiasis or evidence for acute cholecystitis. No biliary dilatation. 2. Hepatic steatosis. Electronically Signed   By: Rise MuBenjamin  McClintock M.D.   On: 05/21/2017 16:26     CBC Recent Labs  Lab 05/20/17 0755 05/21/17 1441 05/22/17 0502  WBC 6.7 10.3 9.3  HGB 14.6 14.6 13.2  HCT 43.4 41.7 39.1*  PLT 272 261 234  MCV 90.1 89.5 90.3  MCH 30.4 31.3 30.5  MCHC 33.7 35.0 33.8  RDW 14.0 13.7 14.3    Chemistries  Recent Labs  Lab 05/20/17 0755 05/21/17 1441 05/22/17 0502  NA 136 132* 135  K 4.0 3.7 3.7  CL 103 100* 103  CO2 23 22 23   GLUCOSE 108* 95 79  BUN 25* 16 15  CREATININE 1.26* 1.24 1.14  CALCIUM 8.9 8.7* 8.2*  AST 42* 40 35  ALT 57 47 41  ALKPHOS 64 66 58  BILITOT 0.7 1.3* 1.5*   ------------------------------------------------------------------------------------------------------------------ estimated creatinine clearance is 81.3 mL/min (by C-G formula based on SCr of 1.14 mg/dL). ------------------------------------------------------------------------------------------------------------------ No results for input(s): HGBA1C in the last 72 hours. ------------------------------------------------------------------------------------------------------------------ No results for input(s): CHOL, HDL, LDLCALC, TRIG, CHOLHDL, LDLDIRECT in the last 72 hours. ------------------------------------------------------------------------------------------------------------------ No results for input(s): TSH, T4TOTAL, T3FREE, THYROIDAB in the last 72 hours.  Invalid input(s):  FREET3 ------------------------------------------------------------------------------------------------------------------ No results for input(s): VITAMINB12, FOLATE, FERRITIN, TIBC, IRON, RETICCTPCT in the last 72 hours.  Coagulation profile No results for input(s): INR, PROTIME in the last 168 hours.  No results for input(s): DDIMER in the last 72 hours.  Cardiac Enzymes No results for input(s): CKMB, TROPONINI, MYOGLOBIN in the last 168 hours.  Invalid input(s): CK ------------------------------------------------------------------------------------------------------------------ Invalid input(s): POCBNP    Assessment & Plan   62 year old male patient with history of alcohol use, GERD, hyperlipidemia, hypertension, arthritis presented to the emergency room with abdominal pain.  1.  Acute alcohol induced pancreatitis Follow-up lipase level Clear liquid diet Appreciate gastroenterology evaluation  Ultrasound of the abdomen showed a dilated common bile duct, will check MRCP abdomen  2 constipation.  Stool softeners   3.  Hydration with Ringer lactate intravenously  4.  Tobacco abuse  tobacco cessation counseled to the patient for 6 minutes  Nicotine patch offered Harmful effects of smoking on respiratory system cardiovascular system and circulatory system explained  5.  Alcohol abuse Start patient on folic acid and thiamine Ciwa protocol        Code Status Orders  (From admission, onward)        Start     Ordered   05/21/17 1731  Full code  Continuous     05/21/17 1732    Code Status History    This patient has a current code status but no historical code status.      Time Spent in minutes   36 minutes  Greater than 50% of time spent in care coordination and counseling patient regarding the condition and plan of care.   Ihor Austin M.D on 05/22/2017 at 12:57 PM  Between 7am to 6pm - Pager - [phone]  After 6pm go to [email or web address] - Air traffic controller  [employer]   Office  [phone]

## 2017-05-23 ENCOUNTER — Inpatient Hospital Stay: Payer: No Typology Code available for payment source

## 2017-05-23 DIAGNOSIS — K760 Fatty (change of) liver, not elsewhere classified: Secondary | ICD-10-CM

## 2017-05-23 LAB — URINALYSIS, COMPLETE (UACMP) WITH MICROSCOPIC
Bacteria, UA: NONE SEEN
Bilirubin Urine: NEGATIVE
Glucose, UA: NEGATIVE mg/dL
KETONES UR: 20 mg/dL — AB
Leukocytes, UA: NEGATIVE
Nitrite: NEGATIVE
PROTEIN: NEGATIVE mg/dL
Specific Gravity, Urine: 1.011 (ref 1.005–1.030)
pH: 6 (ref 5.0–8.0)

## 2017-05-23 LAB — COMPREHENSIVE METABOLIC PANEL
ALBUMIN: 3 g/dL — AB (ref 3.5–5.0)
ALT: 59 U/L (ref 17–63)
ANION GAP: 11 (ref 5–15)
AST: 51 U/L — ABNORMAL HIGH (ref 15–41)
Alkaline Phosphatase: 64 U/L (ref 38–126)
BUN: 13 mg/dL (ref 6–20)
CHLORIDE: 102 mmol/L (ref 101–111)
CO2: 21 mmol/L — ABNORMAL LOW (ref 22–32)
Calcium: 8.4 mg/dL — ABNORMAL LOW (ref 8.9–10.3)
Creatinine, Ser: 1.13 mg/dL (ref 0.61–1.24)
GFR calc Af Amer: >60 mL/min (ref >60)
Glucose, Bld: 80 mg/dL (ref 65–99)
POTASSIUM: 3.5 mmol/L (ref 3.5–5.1)
Sodium: 134 mmol/L — ABNORMAL LOW (ref 135–145)
Total Bilirubin: 1.5 mg/dL — ABNORMAL HIGH (ref 0.3–1.2)
Total Protein: 6.6 g/dL (ref 6.5–8.1)

## 2017-05-23 LAB — GLUCOSE, CAPILLARY: GLUCOSE-CAPILLARY: 81 mg/dL (ref 65–99)

## 2017-05-23 LAB — LIPASE, BLOOD: LIPASE: 28 U/L (ref 11–51)

## 2017-05-23 LAB — HIV ANTIBODY (ROUTINE TESTING W REFLEX): HIV Screen 4th Generation wRfx: NONREACTIVE

## 2017-05-23 MED ORDER — NICOTINE 14 MG/24HR TD PT24: 14.0000 mg | MEDICATED_PATCH | Freq: Every day | TRANSDERMAL | 0 refills | Status: AC

## 2017-05-23 MED ORDER — OXYCODONE-ACETAMINOPHEN 5-325 MG PO TABS: 1.0000 | ORAL_TABLET | Freq: Four times a day (QID) | ORAL | 0 refills | Status: DC | PRN

## 2017-05-23 MED ORDER — GADOBENATE DIMEGLUMINE 529 MG/ML IV SOLN
20.0000 mL | Freq: Once | INTRAVENOUS | Status: AC | PRN
  Administered 2017-05-23: 20 mL via INTRAVENOUS

## 2017-05-23 MED ORDER — LORAZEPAM 2 MG/ML IJ SOLN
1.0000 mg | INTRAMUSCULAR | Status: AC
  Administered 2017-05-23: 1 mg via INTRAVENOUS
  Filled 2017-05-23: qty 1

## 2017-05-23 MED ORDER — POTASSIUM CHLORIDE CRYS ER 20 MEQ PO TBCR
20.0000 meq | EXTENDED_RELEASE_TABLET | Freq: Once | ORAL | Status: AC
Start: 2017-05-23 — End: 2017-05-23
  Administered 2017-05-23: 20 meq via ORAL
  Filled 2017-05-23: qty 1

## 2017-05-23 MED ORDER — HYDRALAZINE HCL 25 MG PO TABS: 25.0000 mg | ORAL_TABLET | Freq: Three times a day (TID) | ORAL | 1 refills | Status: DC

## 2017-05-23 NOTE — Progress Notes (Addendum)
Melodie BouillonVarnita Kostantinos Tallman, MD [address], [address], [place], [place], [id number] [address], [address], [place], [place], [address] Phone: [phone]  Fax: [phone]   Subjective: Patient is abdominal pain is much improved.  She is resting in bed comfortably today and would like to eat.   Objective: Exam: Vital signs in last 24 hours: Vitals:   05/22/17 2305 05/23/17 0500 05/23/17 0509 05/23/17 1103  BP: 128/86  123/77 123/72  Pulse: 74  66 82  Resp: 20  18 18   Temp: 98.7 F (37.1 C)  98.4 F (36.9 C) 98.3 F (36.8 C)  TempSrc: Oral  Oral Oral  SpO2: 96%  98% 95%  Weight:  237 lb 14 oz (107.9 kg)    Height:       Weight change: 20 lb 14 oz (9.469 kg)  Intake/Output Summary (Last 24 hours) at 05/23/2017 1304 Last data filed at 05/23/2017 1050 Gross per 24 hour  Intake 2124 ml  Output 400 ml  Net 1724 ml    General: No acute distress, AAO x3 Abd: Soft, NT/ND, No HSM Skin: Warm, no rashes Neck: Supple, Trachea midline   Lab Results: Lab Results  Component Value Date   WBC 9.3 05/22/2017   HGB 13.2 05/22/2017   HCT 39.1 (L) 05/22/2017   MCV 90.3 05/22/2017   PLT 234 05/22/2017   Micro Results: No results found for this or any previous visit (from the past 240 hour(s)). Studies/Results: Mr 3d Recon At Scanner  Result Date: 05/23/2017 CLINICAL DATA:  Left upper quadrant pain and nausea. Pancreatitis. Elevated lipase. EXAM: MRI ABDOMEN WITHOUT AND WITH CONTRAST (INCLUDING MRCP) TECHNIQUE: Multiplanar multisequence MR imaging of the abdomen was performed both before and after the administration of intravenous contrast. Heavily T2-weighted images of the biliary and pancreatic ducts were obtained, and three-dimensional MRCP images were rendered by post processing. CONTRAST:  20mL MULTIHANCE GADOBENATE DIMEGLUMINE 529 MG/ML IV SOLN COMPARISON:  CT on 05/20/2017 FINDINGS: Image degradation by motion artifact noted. Lower chest: No acute findings. Hepatobiliary: No  hepatic masses identified. Severe hepatic steatosis is seen. Gallbladder is unremarkable. No evidence of biliary ductal dilatation or choledocholithiasis. Pancreas: Swelling is seen involving the pancreatic tail, with mild peripancreatic edema. No evidence of pancreatic necrosis or mass. No evidence of peripancreatic fluid collections. No evidence of pancreatic ductal dilatation or pancreas divisum. Spleen:  Within normal limits in size and appearance. Adrenals/Urinary Tract: No masses identified. Tiny sub-cm cyst in upper pole of left kidney. No evidence of hydronephrosis. Stomach/Bowel: Visualized portion unremarkable. Vascular/Lymphatic: No pathologically enlarged lymph nodes identified. No abdominal aortic aneurysm. Other:  None. Musculoskeletal:  No suspicious bone lesions identified. IMPRESSION: Mild acute pancreatitis involving the pancreatic tail. No evidence of peripancreatic fluid collection or other complication. Severe hepatic steatosis. Electronically Signed   By: Myles RosenthalJohn  Stahl M.D.   On: 05/23/2017 09:20   Mr Abdomen Mrcp Vivien RossettiW Wo Contast  Result Date: 05/23/2017 CLINICAL DATA:  Left upper quadrant pain and nausea. Pancreatitis. Elevated lipase. EXAM: MRI ABDOMEN WITHOUT AND WITH CONTRAST (INCLUDING MRCP) TECHNIQUE: Multiplanar multisequence MR imaging of the abdomen was performed both before and after the administration of intravenous contrast. Heavily T2-weighted images of the biliary and pancreatic ducts were obtained, and three-dimensional MRCP images were rendered by post processing. CONTRAST:  20mL MULTIHANCE GADOBENATE DIMEGLUMINE 529 MG/ML IV SOLN COMPARISON:  CT on 05/20/2017 FINDINGS: Image degradation by motion artifact noted. Lower chest: No acute findings. Hepatobiliary: No hepatic masses identified. Severe hepatic steatosis is seen. Gallbladder is unremarkable.  No evidence of biliary ductal dilatation or choledocholithiasis. Pancreas: Swelling is seen involving the pancreatic tail, with  mild peripancreatic edema. No evidence of pancreatic necrosis or mass. No evidence of peripancreatic fluid collections. No evidence of pancreatic ductal dilatation or pancreas divisum. Spleen:  Within normal limits in size and appearance. Adrenals/Urinary Tract: No masses identified. Tiny sub-cm cyst in upper pole of left kidney. No evidence of hydronephrosis. Stomach/Bowel: Visualized portion unremarkable. Vascular/Lymphatic: No pathologically enlarged lymph nodes identified. No abdominal aortic aneurysm. Other:  None. Musculoskeletal:  No suspicious bone lesions identified. IMPRESSION: Mild acute pancreatitis involving the pancreatic tail. No evidence of peripancreatic fluid collection or other complication. Severe hepatic steatosis. Electronically Signed   By: Myles Rosenthal M.D.   On: 05/23/2017 09:20   US Abdomen Limited Ruq  Result Date: 05/21/2017 CLINICAL DATA:  Initial evaluation for elevated lipase, mid abdominal pain. EXAM: ULTRASOUND ABDOMEN LIMITED RIGHT UPPER QUADRANT COMPARISON:  Prior CT from 05/20/2017. FINDINGS: Gallbladder: No gallstones or wall thickening visualized. No sonographic Murphy sign noted by sonographer. Common bile duct: Diameter: 4.8 mm Liver: No focal lesion identified. Diffusely increased echogenicity, compatible with steatosis. Portal vein is patent on color Doppler imaging with normal direction of blood flow towards the liver. IMPRESSION: 1. Normal sonographic appearance of the gallbladder. No cholelithiasis or evidence for acute cholecystitis. No biliary dilatation. 2. Hepatic steatosis. Electronically Signed   By: Rise Mu M.D.   On: 05/21/2017 16:26   Medications:  Scheduled Meds: . enoxaparin (LOVENOX) injection  40 mg Subcutaneous Q24H  . FLUoxetine  20 mg Oral Daily  . folic acid  1 mg Oral Daily  . gabapentin  300 mg Oral BID  . hydrALAZINE  25 mg Oral Q8H  . LORazepam  0-4 mg Oral Q6H   Followed by  . [START ON 05/24/2017] LORazepam  0-4 mg Oral  Q12H  . mirabegron ER  25 mg Oral Daily  . multivitamin with minerals  1 tablet Oral Daily  . nicotine  14 mg Transdermal Daily  . pantoprazole (PROTONIX) IV  40 mg Intravenous Q12H  . tamsulosin  0.8 mg Oral Daily  . thiamine  100 mg Oral Daily   Or  . thiamine  100 mg Intravenous Daily   Continuous Infusions: . lactated ringers 150 mL/hr at 05/23/17 0613   PRN Meds:.acetaminophen **OR** acetaminophen, fentaNYL (SUBLIMAZE) injection, LORazepam **OR** LORazepam, morphine injection, ondansetron **OR** ondansetron (ZOFRAN) IV, oxyCODONE-acetaminophen, promethazine   Assessment: Active Problems:   Acute pancreatitis    Plan: Acute pancreatitis is from his alcohol use at home Abstinence encouraged MRCP today did not show any choledocholithiasis or biliary duct obstruction Hepatic steatosis was noted Continue management for acute pink otitis with IV fluids and pain medication Okay to advance diet to clear liquids, and if tolerated advance to low-fat diet tonight or tomorrow morning Patient should follow-up with GI as an outpatient as well within 1 to 2 weeks.  Primary team to please make this appointment at the time of discharge.  Hepatic steatosis can be followed as an outpatient   LOS: 2 days   Melodie Bouillon, MD 05/23/2017, 1:04 PM

## 2017-05-23 NOTE — Discharge Summary (Signed)
[facility] - [place] [facility], 62 y.o., DOB 1955/08/24, MRN [id number]. Admission date: 05/21/2017 Discharge Date 05/23/2017 Primary MD Galen Manila, NP Admitting Physician Katha Hamming, MD  Admission Diagnosis   1.  Acute pancreatitis is 2.  Hypertension 3.  Prostate hypertrophy 4.  Abdominal pain  Discharge Diagnosis       1.  Acute pancreatitis resolved 2.  Abdominal pain resolved 3.  Hypertension 4.  Prostate hypertrophy    Hospital Course 62 year old male patient with history of tobacco abuse, alcohol abuse, prostate hypertrophy, hypertension, chronic pain was admitted on 05/21/2017 for abdominal discomfort and pancreatitis.  He received IV fluids and was kept n.p.o. patient was seen by gastroenterology in the hospital.  He was worked up with CT kidney stone protocol which showed no stones but peripancreatic inflammation.  He was worked up with MRI abdomen which showed a pancreatic inflammation but no necrosis.  His lipase level decreased and normalized at the time of discharge.  Abdominal pain resolved.  Tolerated diet well.  Thiazide diuretic was discontinued.  Ultrasound of the abdomen showed no cholelithiasis.  Normal-appearing gallbladder.    Consults  Gastroenterology  Significant Tests:  See full reports for all details    Mr Shoulder Left Wo Contrast  Result Date: 04/28/2017 CLINICAL DATA:  Chronic left shoulder pain. EXAM: MRI OF THE LEFT SHOULDER WITHOUT CONTRAST TECHNIQUE: Multiplanar, multisequence MR imaging of the shoulder was performed. No intravenous contrast was administered. COMPARISON:  None. FINDINGS: Rotator cuff: There is a deep non retracted partial-thickness bursal surface tear of the distal supraspinatus tendon, 17 mm in width, best seen on images 5 and 6 of series 7 and on images 13 through 16 of series 4. The rotator cuff is otherwise intact. Muscles: No atrophy or abnormal signal of the muscles of the  rotator cuff. Biceps long head:  Properly located and intact. Acromioclavicular Joint:  Normal.  Type 2 acromion. No bursitis. Glenohumeral Joint: Normal. Labrum:  Normal. Bones:  Normal. Other: None IMPRESSION: Partial-thickness linear articular surface tear of the anterior aspect of the distal supraspinatus tendon extending most of the way through the tendon. No retraction. Electronically Signed   By: Francene Boyers M.D.   On: 04/28/2017 14:07   Mr 3d Recon At Scanner  Result Date: 05/23/2017 CLINICAL DATA:  Left upper quadrant pain and nausea. Pancreatitis. Elevated lipase. EXAM: MRI ABDOMEN WITHOUT AND WITH CONTRAST (INCLUDING MRCP) TECHNIQUE: Multiplanar multisequence MR imaging of the abdomen was performed both before and after the administration of intravenous contrast. Heavily T2-weighted images of the biliary and pancreatic ducts were obtained, and three-dimensional MRCP images were rendered by post processing. CONTRAST:  20mL MULTIHANCE GADOBENATE DIMEGLUMINE 529 MG/ML IV SOLN COMPARISON:  CT on 05/20/2017 FINDINGS: Image degradation by motion artifact noted. Lower chest: No acute findings. Hepatobiliary: No hepatic masses identified. Severe hepatic steatosis is seen. Gallbladder is unremarkable. No evidence of biliary ductal dilatation or choledocholithiasis. Pancreas: Swelling is seen involving the pancreatic tail, with mild peripancreatic edema. No evidence of pancreatic necrosis or mass. No evidence of peripancreatic fluid collections. No evidence of pancreatic ductal dilatation or pancreas divisum. Spleen:  Within normal limits in size and appearance. Adrenals/Urinary Tract: No masses identified. Tiny sub-cm cyst in upper pole of left kidney. No evidence of hydronephrosis. Stomach/Bowel: Visualized portion unremarkable. Vascular/Lymphatic: No pathologically enlarged lymph nodes identified. No abdominal aortic aneurysm. Other:  None. Musculoskeletal:  No suspicious bone lesions identified.  IMPRESSION: Mild acute pancreatitis involving the pancreatic tail.  No evidence of peripancreatic fluid collection or other complication. Severe hepatic steatosis. Electronically Signed   By: Myles Rosenthal M.D.   On: 05/23/2017 09:20   Mr Abdomen Mrcp Vivien Rossetti Contast  Result Date: 05/23/2017 CLINICAL DATA:  Left upper quadrant pain and nausea. Pancreatitis. Elevated lipase. EXAM: MRI ABDOMEN WITHOUT AND WITH CONTRAST (INCLUDING MRCP) TECHNIQUE: Multiplanar multisequence MR imaging of the abdomen was performed both before and after the administration of intravenous contrast. Heavily T2-weighted images of the biliary and pancreatic ducts were obtained, and three-dimensional MRCP images were rendered by post processing. CONTRAST:  20mL MULTIHANCE GADOBENATE DIMEGLUMINE 529 MG/ML IV SOLN COMPARISON:  CT on 05/20/2017 FINDINGS: Image degradation by motion artifact noted. Lower chest: No acute findings. Hepatobiliary: No hepatic masses identified. Severe hepatic steatosis is seen. Gallbladder is unremarkable. No evidence of biliary ductal dilatation or choledocholithiasis. Pancreas: Swelling is seen involving the pancreatic tail, with mild peripancreatic edema. No evidence of pancreatic necrosis or mass. No evidence of peripancreatic fluid collections. No evidence of pancreatic ductal dilatation or pancreas divisum. Spleen:  Within normal limits in size and appearance. Adrenals/Urinary Tract: No masses identified. Tiny sub-cm cyst in upper pole of left kidney. No evidence of hydronephrosis. Stomach/Bowel: Visualized portion unremarkable. Vascular/Lymphatic: No pathologically enlarged lymph nodes identified. No abdominal aortic aneurysm. Other:  None. Musculoskeletal:  No suspicious bone lesions identified. IMPRESSION: Mild acute pancreatitis involving the pancreatic tail. No evidence of peripancreatic fluid collection or other complication. Severe hepatic steatosis. Electronically Signed   By: Myles Rosenthal M.D.   On:  05/23/2017 09:20   Ct Renal Stone Study  Result Date: 05/20/2017 CLINICAL DATA:  Left-sided flank pain today, history of kidney stones EXAM: CT ABDOMEN AND PELVIS WITHOUT CONTRAST TECHNIQUE: Multidetector CT imaging of the abdomen and pelvis was performed following the standard protocol without IV contrast. COMPARISON:  CT abdomen pelvis of 03/10/2013 FINDINGS: Lower chest: In the lung bases, there is a 5 mm noncalcified nodule within the lingula on image 1. No additional abnormality is seen within the lung bases. No effusion is seen. Heart is mildly enlarged. No pericardial effusion is seen. Hepatobiliary: The liver is diffusely low in attenuation consistent with fatty infiltration diffusely. Some sparing is noted near the gallbladder. No calcified gallstones are seen. Pancreas: The pancreas is normal in size and the pancreatic duct is not dilated. Better seen on coronal images there is slight peripancreatic strandiness along the distal aspect of the pancreas of questionable significance. Very mild pancreatitis would be difficult to exclude with this exam. Spleen: The spleen is unremarkable. Adrenals/Urinary Tract: No renal calculi are seen. There is no evidence of hydronephrosis. The ureters appear normal in caliber. The urinary bladder is moderately urine distended with no abnormality noted. Stomach/Bowel: The stomach is not well distended but no abnormality is evident. No small bowel distention is seen. Much of the colon is decompressed and cannot be evaluated. No evidence of diverticulitis is seen. The terminal ileum and the appendix are well visualized with no abnormality noted. Vascular/Lymphatic: The abdominal aorta is normal in caliber with mild abdominal aortic atherosclerosis present. No adenopathy is seen. Reproductive: The prostate is normal in size. Other: Some fat does enter both inguinal canals left-greater-than-right. Musculoskeletal: Mild degenerative changes are present in both hips. The SI  joints are unremarkable. The lumbar vertebrae are normal alignment with normal intervertebral disc spaces. IMPRESSION: 1. The only questionable abnormality is slight strandiness of the peripancreatic fat planes near the tail of the pancreas. Mild pancreatitis would be difficult to  exclude. Correlate clinically. 2. No renal or ureteral calculi are seen. There is no evidence of hydronephrosis. 3. Diffuse fatty infiltration of the liver. 4. The appendix and terminal ileum are unremarkable. Electronically Signed   By: Dwyane Dee M.D.   On: 05/20/2017 09:52   US Abdomen Limited Ruq  Result Date: 05/21/2017 CLINICAL DATA:  Initial evaluation for elevated lipase, mid abdominal pain. EXAM: ULTRASOUND ABDOMEN LIMITED RIGHT UPPER QUADRANT COMPARISON:  Prior CT from 05/20/2017. FINDINGS: Gallbladder: No gallstones or wall thickening visualized. No sonographic Murphy sign noted by sonographer. Common bile duct: Diameter: 4.8 mm Liver: No focal lesion identified. Diffusely increased echogenicity, compatible with steatosis. Portal vein is patent on color Doppler imaging with normal direction of blood flow towards the liver. IMPRESSION: 1. Normal sonographic appearance of the gallbladder. No cholelithiasis or evidence for acute cholecystitis. No biliary dilatation. 2. Hepatic steatosis. Electronically Signed   By: Rise Mu M.D.   On: 05/21/2017 16:26       Today   Subjective:   Carlos Reynolds 62 year old male patient seen today. No complaints of abdominal pain. No fever and chills No chest pain Nausea and vomiting.  Objective:   Blood pressure 123/72, pulse 82, temperature 98.3 F (36.8 C), temperature source Oral, resp. rate 18, height 5\' 9"  (1.753 m), weight 107.9 kg (237 lb 14 oz), SpO2 95 %.  .  Intake/Output Summary (Last 24 hours) at 05/23/2017 1604 Last data filed at 05/23/2017 1050 Gross per 24 hour  Intake 2244 ml  Output 400 ml  Net 1844 ml    Exam VITAL SIGNS: Blood pressure  123/72, pulse 82, temperature 98.3 F (36.8 C), temperature source Oral, resp. rate 18, height 5\' 9"  (1.753 m), weight 107.9 kg (237 lb 14 oz), SpO2 95 %.  GENERAL:  62 y.o.-year-old patient lying in the bed with no acute distress.  EYES: Pupils equal, round, reactive to light and accommodation. No scleral icterus. Extraocular muscles intact.  HEENT: Head atraumatic, normocephalic. Oropharynx and nasopharynx clear.  NECK:  Supple, no jugular venous distention. No thyroid enlargement, no tenderness.  LUNGS: Normal breath sounds bilaterally, no wheezing, rales,rhonchi or crepitation. No use of accessory muscles of respiration.  CARDIOVASCULAR: S1, S2 normal. No murmurs, rubs, or gallops.  ABDOMEN: Soft, nontender, nondistended. Bowel sounds present. No organomegaly or mass.  EXTREMITIES: No pedal edema, cyanosis, or clubbing.  NEUROLOGIC: Cranial nerves II through XII are intact. Muscle strength 5/5 in all extremities. Sensation intact. Gait not checked.  PSYCHIATRIC: The patient is alert and oriented x 3.  SKIN: No obvious rash, lesion, or ulcer.   Data Review     CBC w Diff:  Lab Results  Component Value Date   WBC 9.3 05/22/2017   HGB 13.2 05/22/2017   HCT 39.1 (L) 05/22/2017   PLT 234 05/22/2017   LYMPHOPCT 19 08/12/2016   MONOPCT 11 08/12/2016   EOSPCT 2 08/12/2016   BASOPCT 0 08/12/2016   CMP:  Lab Results  Component Value Date   NA 134 (L) 05/23/2017   K 3.5 05/23/2017   CL 102 05/23/2017   CO2 21 (L) 05/23/2017   BUN 13 05/23/2017   CREATININE 1.13 05/23/2017   PROT 6.6 05/23/2017   ALBUMIN 3.0 (L) 05/23/2017   BILITOT 1.5 (H) 05/23/2017   ALKPHOS 64 05/23/2017   AST 51 (H) 05/23/2017   ALT 59 05/23/2017  .  Micro Results No results found for this or any previous visit (from the past 240 hour(s)).  Code Status Orders  (From admission, onward)        Start     Ordered   05/21/17 1731  Full code  Continuous     05/21/17 1732    Code Status  History    This patient has a current code status but no historical code status.          Follow-up Information    Galen ManilaKennedy, Lauren Renee, NP. Go on 05/30/2017.   Specialty:  Nurse Practitioner Why:  Firday at 11:00am for hospital follow-up Contact information: [address] [place] [place] [address] [phone]           Discharge Medications   Allergies as of 05/23/2017      Reactions   Shellfish Allergy Hives   Tape    Manson PasseyBrown, elastic tape used by physical therapist; skin breaks out   Amlodipine Other (See Comments)   Possible hand swelling   Lisinopril Other (See Comments)   Cough      Medication List    STOP taking these medications   amoxicillin-clavulanate 875-125 MG tablet Commonly known as:  AUGMENTIN   diazepam 2 MG tablet Commonly known as:  VALIUM   diclofenac 75 MG EC tablet Commonly known as:  VOLTAREN   hydrochlorothiazide 25 MG tablet Commonly known as:  HYDRODIURIL   HYDROcodone-acetaminophen 7.5-325 MG tablet Commonly known as:  NORCO   mirabegron ER 25 MG Tb24 tablet Commonly known as:  MYRBETRIQ   oxyCODONE 5 MG immediate release tablet Commonly known as:  ROXICODONE   sildenafil 20 MG tablet Commonly known as:  REVATIO     TAKE these medications   FLUoxetine 20 MG capsule Commonly known as:  PROZAC Take 1 capsule (20 mg total) by mouth daily.   gabapentin 300 MG capsule Commonly known as:  NEURONTIN Take 1 capsule (300 mg total) by mouth 2 (two) times daily.   hydrALAZINE 25 MG tablet Commonly known as:  APRESOLINE Take 1 tablet (25 mg total) by mouth every 8 (eight) hours.   nicotine 14 mg/24hr patch Commonly known as:  NICODERM CQ - dosed in mg/24 hours Place 1 patch (14 mg total) onto the skin daily. Start taking on:  05/24/2017   omeprazole 20 MG capsule Commonly known as:  PRILOSEC TAKE 1 CAPSULE (20 MG) BY MOUTH ONCE DAILY   oxyCODONE-acetaminophen 5-325 MG tablet Commonly known as:  PERCOCET/ROXICET Take 1 tablet  by mouth every 6 (six) hours as needed for up to 5 days for moderate pain.   tamsulosin 0.4 MG Caps capsule Commonly known as:  FLOMAX Take 2 capsules (0.8 mg total) by mouth daily.          Total Time in preparing paper work, data evaluation and todays exam - 35 minutes  Ihor AustinPavan Pyreddy M.D on 05/23/2017 at 4:04 PM [facility]   Office  [phone]

## 2017-05-26 NOTE — Progress Notes (Signed)
05/27/2017 12:26 PM   Carlos Reynolds [birth year] [id number]  Referring provider: Galen Manila, NP [address] [place], [place] [address]  Chief Complaint  Patient presents with  . Hematuria    HPI: Patient is a 62 year old African-American male with BPH and LUT S and erectile dysfunction and chronic pelvic pain who presents today for a three weeks follow up after a trial of Myrbetriq 25 mg daily.    At his visit on 01/15/2017, he was complaining of bilateral testicular pain.  He stated the pain was so severe he could not have intercourse.  Scrotal ultrasound performed on 01/21/2017 noted a 8 mm mildly heterogeneous left epididymal cyst.  Small left varicocele.  Otherwise normal testicular ultrasound.  He is currently undergoing PT with Dr. Mariane Masters.   He has already seen an improvement in his testicular pain.  He is not experiencing fevers, chills, nausea or vomiting.  He is not having dysuria, gross hematuria or suprapubic pain.  He was having gross hematuria on 04/17/2017.  His UA on 04/17/2017 was negative, but his urine culture was positive for Beta hemolytic Streptococcus, group B.  He completed a course of Augmentin 875/125, bid for seven days.    His UA  (05/01/2017) is negative.   Today's UA (05/27/2017) is negative.    BPH WITH LUTS  (prostate and/or bladder) IPSS score: 22/5  PVR: 19 mL  Previous score: 17/4   Previous PVR: 30 mL  Major complaint(s): nocturia, a weak urinary stream and incontinence x for several months   Denies any dysuria, hematuria or suprapubic pain.   Currently taking: tamsulosin 0.4 mg daily and Myrbetriq 25 mg daily.  His BP is 139/91.    Denies any recent fevers, chills, nausea or vomiting.  His father was diagnosed with fatal prostate cancer at age 67.    IPSS    Row Name 05/01/17 1300 05/27/17 1100       International Prostate Symptom Score   How often have you had the sensation of not emptying your bladder?  About half the  time  About half the time    How often have you had to urinate less than every two hours?  About half the time  About half the time    How often have you found you stopped and started again several times when you urinated?  Less than 1 in 5 times  More than half the time    How often have you found it difficult to postpone urination?  Almost always  Almost always    How often have you had a weak urinary stream?  About half the time  Less than half the time    How often have you had to strain to start urination?  Not at All  About half the time    How many times did you typically get up at night to urinate?  2 Times  2 Times    Total IPSS Score  17  22      Quality of Life due to urinary symptoms   If you were to spend the rest of your life with your urinary condition just the way it is now how would you feel about that?  Mostly Disatisfied  Unhappy       Score:  1-7 Mild 8-19 Moderate 20-35 Severe  Erectile dysfunction SHIM score was 11.  He has been having difficulty with erections for over a year.  His major complaint is erections  not firm enough.  His libido is preserved.   His risk factors for ED are age, BPH, HTN, alcohol abuse, smoking and pain medication.  He denies any painful erections or curvatures with his erections.   He is still having spontaneous erections.  He has tried three sildenafil and they were not effective.   SHIM    Row Name 05/01/17 1342         SHIM: Over the last 6 months:   How do you rate your confidence that you could get and keep an erection?  Low     When you had erections with sexual stimulation, how often were your erections hard enough for penetration (entering your partner)?  A Few Times (much less than half the time)     During sexual intercourse, how often were you able to maintain your erection after you had penetrated (entered) your partner?  A Few Times (much less than half the time)     During sexual intercourse, how difficult was it to maintain  your erection to completion of intercourse?  Very Difficult     When you attempted sexual intercourse, how often was it satisfactory for you?  Sometimes (about half the time)       SHIM Total Score   SHIM  11        Score: 1-7 Severe ED 8-11 Moderate ED 12-16 Mild-Moderate ED 17-21 Mild ED 22-25 No ED  PMH: Past Medical History:  Diagnosis Date  . Anxiety   . Arthritis   . Depression   . Heartburn   . HLD (hyperlipidemia)   . Hypertension   . Kidney stone   . Pancreatitis 05/20/2017    Surgical History: Past Surgical History:  Procedure Laterality Date  . COLONOSCOPY    . COLONOSCOPY WITH PROPOFOL N/A 04/15/2017   Procedure: COLONOSCOPY WITH PROPOFOL;  Surgeon: Wyline MoodAnna, Kiran, MD;  [facility];  Service: Gastroenterology;  Laterality: N/A;  . NO PAST SURGERIES     verified with patient on 01/15/17    Home Medications:  Allergies as of 05/27/2017      Reactions   Shellfish Allergy Hives   Tape    Brown, elastic tape used by physical therapist; skin breaks out   Amlodipine Other (See Comments)   Possible hand swelling   Lisinopril Other (See Comments)   Cough      Medication List        Accurate as of 05/27/17 11:59 PM. Always use your most recent med list.          FLUoxetine 20 MG capsule Commonly known as:  PROZAC Take 1 capsule (20 mg total) by mouth daily.   gabapentin 300 MG capsule Commonly known as:  NEURONTIN Take 1 capsule (300 mg total) by mouth 2 (two) times daily.   hydrALAZINE 25 MG tablet Commonly known as:  APRESOLINE Take 1 tablet (25 mg total) by mouth every 8 (eight) hours.   HYDROcodone-acetaminophen 7.5-325 MG tablet Commonly known as:  NORCO Take 1 tablet by mouth 2 (two) times daily as needed for moderate pain. For chronic pain To last for 30 days from fill date Start taking on:  06/17/2017   nicotine 14 mg/24hr patch Commonly known as:  NICODERM CQ - dosed in mg/24 hours Place 1 patch (14 mg total) onto the skin  daily.   omeprazole 20 MG capsule Commonly known as:  PRILOSEC TAKE 1 CAPSULE (20 MG) BY MOUTH ONCE DAILY   sildenafil 20 MG tablet Commonly known  as:  REVATIO Take 3 to 5 tablets two hours before intercouse on an empty stomach.  Do not take with nitrates.   tamsulosin 0.4 MG Caps capsule Commonly known as:  FLOMAX Take 2 capsules (0.8 mg total) by mouth daily.       Allergies:  Allergies  Allergen Reactions  . Shellfish Allergy Hives  . Tape     Brown, elastic tape used by physical therapist; skin breaks out  . Amlodipine Other (See Comments)    Possible hand swelling  . Lisinopril Other (See Comments)    Cough    Family History: Family History  Problem Relation Age of Onset  . Heart disease Mother   . Colon cancer Mother   . Diabetes Father   . Prostate cancer Father   . Colon cancer Brother   . Heart disease Brother   . Kidney cancer Neg Hx   . Bladder Cancer Neg Hx     Social History:  reports that he has been smoking cigarettes.  He has a 23.00 pack-year smoking history. He has never used smokeless tobacco. He reports that he drinks about 8.4 oz of alcohol per week. He reports that he does not use drugs.  ROS: UROLOGY Frequent Urination?: No Hard to postpone urination?: No Burning/pain with urination?: No Get up at night to urinate?: Yes Leakage of urine?: Yes Urine stream starts and stops?: No Trouble starting stream?: No Do you have to strain to urinate?: No Blood in urine?: Yes Urinary tract infection?: No Sexually transmitted disease?: No Injury to kidneys or bladder?: No Painful intercourse?: No Weak stream?: Yes Erection problems?: Yes Penile pain?: No  Gastrointestinal Nausea?: No Vomiting?: No Indigestion/heartburn?: No Diarrhea?: No Constipation?: No  Constitutional Fever: No Night sweats?: No Weight loss?: No Fatigue?: No  Skin Skin rash/lesions?: No Itching?: No  Eyes Blurred vision?: No Double vision?:  No  Ears/Nose/Throat Sore throat?: No Sinus problems?: No  Hematologic/Lymphatic Swollen glands?: No Easy bruising?: No  Cardiovascular Leg swelling?: No Chest pain?: No  Respiratory Cough?: No Shortness of breath?: Yes  Endocrine Excessive thirst?: No  Musculoskeletal Back pain?: Yes Joint pain?: Yes  Neurological Headaches?: No Dizziness?: No  Psychologic Depression?: No Anxiety?: Yes  Physical Exam: BP (!) 139/91 (BP Location: Right Arm, Patient Position: Sitting, Cuff Size: Normal)   Pulse 78   Ht 5\' 10"  (1.778 m)   Wt 227 lb (103 kg)   BMI 32.57 kg/m   Constitutional: Well nourished. Alert and oriented, No acute distress. HEENT: [place] AT, moist mucus membranes. Trachea midline, no masses. Cardiovascular: No clubbing, cyanosis, or edema. Respiratory: Normal respiratory effort, no increased work of breathing. Skin: No rashes, bruises or suspicious lesions. Neurologic: Grossly intact, no focal deficits, moving all 4 extremities. Psychiatric: Normal mood and affect.  Laboratory Data: PSA History  1.1 in 02/2013  0.6 in 11/2013  1.1 in 11/2016 Lab Results  Component Value Date   WBC 9.3 05/22/2017   HGB 13.2 05/22/2017   HCT 39.1 (L) 05/22/2017   MCV 90.3 05/22/2017   PLT 234 05/22/2017    Lab Results  Component Value Date   CREATININE 1.13 05/23/2017    Lab Results  Component Value Date   AST 51 (H) 05/23/2017   Lab Results  Component Value Date   ALT 59 05/23/2017    Urinalysis See Epic.   I have reviewed the labs.  Pertinent imaging Results for Carlos Reynolds, Carlos Reynolds (MRN [id number]) as of 06/11/2017 12:55  Ref. Range 05/27/2017 11:47  Scan  Result Unknown 19     Assessment & Plan:    1. History of hematuria Associated with an UTI - has since resolve UA is negative today  Will continue to monitor and patient will report any episodes of hematuria  2. BPH with LU TS IPSS score is 22/5, it is worsening  Continue conservative  management, avoiding bladder irritants and timed voiding's Did not find the Myrbetriq effective most bothersome symptoms is/are nocturia, weak urinary stream and incontinence Continue tamsulosin 0.4 mg daily Patient has been referred to orthopedics regarding his shoulder and may be undergoing shoulder surgery in the future He will RTC in 6 months for I PSS, exam and PSA  3. Erectile dysfunction Explained to the patient that the sildenafil may work better if he takes it on an empty stomach, two hours prior to intercourse and he may want to take 5 tablets I also briefly discussed injections, but he is not interested in this at this time.     Return in about 6 months (around 11/26/2017) for IPSS, SHIM, PSA and exam.  These notes generated with voice recognition software. I apologize for typographical errors.  Michiel Cowboy, PA-C  [facility] [address] [place], [place] [address] [phone]

## 2017-05-27 ENCOUNTER — Other Ambulatory Visit: Payer: Self-pay

## 2017-05-27 ENCOUNTER — Encounter: Payer: Self-pay | Admitting: Student in an Organized Health Care Education/Training Program

## 2017-05-27 ENCOUNTER — Encounter: Payer: Self-pay | Admitting: Urology

## 2017-05-27 ENCOUNTER — Ambulatory Visit
Payer: No Typology Code available for payment source | Attending: Student in an Organized Health Care Education/Training Program | Admitting: Student in an Organized Health Care Education/Training Program

## 2017-05-27 ENCOUNTER — Ambulatory Visit (INDEPENDENT_AMBULATORY_CARE_PROVIDER_SITE_OTHER): Payer: No Typology Code available for payment source | Admitting: Urology

## 2017-05-27 VITALS — BP 138/87 | HR 73 | Temp 98.1°F | Ht 70.0 in | Wt 217.0 lb

## 2017-05-27 VITALS — BP 139/91 | HR 78 | Ht 70.0 in | Wt 227.0 lb

## 2017-05-27 DIAGNOSIS — K859 Acute pancreatitis without necrosis or infection, unspecified: Secondary | ICD-10-CM | POA: Diagnosis not present

## 2017-05-27 DIAGNOSIS — Z79899 Other long term (current) drug therapy: Secondary | ICD-10-CM | POA: Insufficient documentation

## 2017-05-27 DIAGNOSIS — F419 Anxiety disorder, unspecified: Secondary | ICD-10-CM | POA: Diagnosis not present

## 2017-05-27 DIAGNOSIS — F329 Major depressive disorder, single episode, unspecified: Secondary | ICD-10-CM | POA: Diagnosis not present

## 2017-05-27 DIAGNOSIS — M75102 Unspecified rotator cuff tear or rupture of left shoulder, not specified as traumatic: Secondary | ICD-10-CM | POA: Diagnosis not present

## 2017-05-27 DIAGNOSIS — M19012 Primary osteoarthritis, left shoulder: Secondary | ICD-10-CM

## 2017-05-27 DIAGNOSIS — N529 Male erectile dysfunction, unspecified: Secondary | ICD-10-CM | POA: Diagnosis not present

## 2017-05-27 DIAGNOSIS — Z87442 Personal history of urinary calculi: Secondary | ICD-10-CM | POA: Insufficient documentation

## 2017-05-27 DIAGNOSIS — F17210 Nicotine dependence, cigarettes, uncomplicated: Secondary | ICD-10-CM | POA: Insufficient documentation

## 2017-05-27 DIAGNOSIS — G894 Chronic pain syndrome: Secondary | ICD-10-CM | POA: Insufficient documentation

## 2017-05-27 DIAGNOSIS — G8929 Other chronic pain: Secondary | ICD-10-CM | POA: Diagnosis not present

## 2017-05-27 DIAGNOSIS — Z5181 Encounter for therapeutic drug level monitoring: Secondary | ICD-10-CM | POA: Diagnosis not present

## 2017-05-27 DIAGNOSIS — N401 Enlarged prostate with lower urinary tract symptoms: Secondary | ICD-10-CM

## 2017-05-27 DIAGNOSIS — Z87448 Personal history of other diseases of urinary system: Secondary | ICD-10-CM

## 2017-05-27 DIAGNOSIS — M25512 Pain in left shoulder: Secondary | ICD-10-CM

## 2017-05-27 DIAGNOSIS — I10 Essential (primary) hypertension: Secondary | ICD-10-CM | POA: Insufficient documentation

## 2017-05-27 DIAGNOSIS — M75100 Unspecified rotator cuff tear or rupture of unspecified shoulder, not specified as traumatic: Secondary | ICD-10-CM

## 2017-05-27 DIAGNOSIS — K219 Gastro-esophageal reflux disease without esophagitis: Secondary | ICD-10-CM | POA: Insufficient documentation

## 2017-05-27 DIAGNOSIS — F32A Depression, unspecified: Secondary | ICD-10-CM

## 2017-05-27 DIAGNOSIS — Z91013 Allergy to seafood: Secondary | ICD-10-CM | POA: Insufficient documentation

## 2017-05-27 DIAGNOSIS — Z888 Allergy status to other drugs, medicaments and biological substances status: Secondary | ICD-10-CM | POA: Diagnosis not present

## 2017-05-27 DIAGNOSIS — N138 Other obstructive and reflux uropathy: Secondary | ICD-10-CM

## 2017-05-27 DIAGNOSIS — R339 Retention of urine, unspecified: Secondary | ICD-10-CM | POA: Diagnosis not present

## 2017-05-27 DIAGNOSIS — E785 Hyperlipidemia, unspecified: Secondary | ICD-10-CM | POA: Insufficient documentation

## 2017-05-27 DIAGNOSIS — R310 Gross hematuria: Secondary | ICD-10-CM

## 2017-05-27 LAB — MICROSCOPIC EXAMINATION: RBC MICROSCOPIC, UA: NONE SEEN /HPF (ref 0–2)

## 2017-05-27 LAB — URINALYSIS, COMPLETE
Bilirubin, UA: NEGATIVE
Glucose, UA: NEGATIVE
Ketones, UA: NEGATIVE
Leukocytes, UA: NEGATIVE
Nitrite, UA: NEGATIVE
RBC, UA: NEGATIVE
Specific Gravity, UA: >1.03 — ABNORMAL HIGH (ref 1.005–1.030)
Urobilinogen, Ur: 0.2 mg/dL (ref 0.2–1.0)
pH, UA: 5.5 (ref 5.0–7.5)

## 2017-05-27 LAB — BLADDER SCAN AMB NON-IMAGING: Scan Result: 19

## 2017-05-27 MED ORDER — SILDENAFIL CITRATE 20 MG PO TABS: ORAL_TABLET | ORAL | 3 refills | Status: DC

## 2017-05-27 MED ORDER — HYDROCODONE-ACETAMINOPHEN 7.5-325 MG PO TABS: 1.0000 | ORAL_TABLET | Freq: Two times a day (BID) | ORAL | 0 refills | Status: DC | PRN

## 2017-05-27 NOTE — Progress Notes (Signed)
Nursing Pain Medication Assessment:  Safety precautions to be maintained throughout the outpatient stay will include: orient to surroundings, keep bed in low position, maintain call bell within reach at all times, provide assistance with transfer out of bed and ambulation.  Medication Inspection Compliance: Pill count conducted under aseptic conditions, in front of the patient. Neither the pills nor the bottle was removed from the patient's sight at any time. Once count was completed pills were immediately returned to the patient in their original bottle.  Medication: Hydrocodone/APAP Pill/Patch Count: 10 of 60 pills remain Pill/Patch Appearance: Markings consistent with prescribed medication Bottle Appearance: Standard pharmacy container. Clearly labeled. Filled Date: 02 / 23 / 2019 Last Medication intake:  Today

## 2017-05-27 NOTE — Progress Notes (Signed)
Patient's Name: Carlos Reynolds  MRN: [id number]  Referring Provider: Mikey College, *  DOB: Sep 29, 1955  PCP: Mikey College, NP  DOS: 05/27/2017  Note by: Gillis Santa, MD  Service setting: Ambulatory outpatient  Specialty: Interventional Pain Management  Location: [facility] (AMB) Pain Management Facility    Patient type: Established   Primary Reason(s) for Visit: Encounter for prescription drug management. (Level of risk: moderate)  CC: Shoulder Pain (left)  HPI  Carlos Reynolds is a 62 y.o. year old, male patient, who comes today for a medication management evaluation. He has Osteoarthritis of left shoulder; Benign prostatic hyperplasia with incomplete bladder emptying; Erectile dysfunction; Current moderate episode of major depressive disorder without prior episode ([place]); Eczema; Depression; Chronic left shoulder pain; Chronic pain syndrome; Essential hypertension; Gastroesophageal reflux disease; and Acute pancreatitis on their problem list. His primarily concern today is the Shoulder Pain (left)  Pain Assessment: Location: Left Shoulder Radiating: denies Onset: More than a month ago Duration: Chronic pain Quality: Constant, Aching, Guarding Severity: 8 /10 (self-reported pain score)  Note: Reported level is compatible with observation.                         When using our objective Pain Scale, levels between 6 and 10/10 are said to belong in an emergency room, as it progressively worsens from a 6/10, described as severely limiting, requiring emergency care not usually available at an outpatient pain management facility. At a 6/10 level, communication becomes difficult and requires great effort. Assistance to reach the emergency department may be required. Facial flushing and profuse sweating along with potentially dangerous increases in heart rate and blood pressure will be evident. Effect on ADL:   Timing: Constant Modifying factors: medications, heating pad, pain cream  Carlos Reynolds  was last scheduled for an appointment on 05/01/2017 for medication management. During today's appointment we reviewed Carlos Reynolds's chronic pain status, as well as his outpatient medication regimen.  The patient  reports that he does not use drugs. His body mass index is 31.14 kg/m.  Further details on both, my assessment(s), as well as the proposed treatment plan, please see below.  Controlled Substance Pharmacotherapy Assessment REMS (Risk Evaluation and Mitigation Strategy)  Analgesic: Hydrocodone 7.5 mg twice daily as needed, quantity 40-monthMME/day: 15 mg/day.  BChauncey Fischer RN  05/27/2017 10:54 AM  Sign at close encounter Nursing Pain Medication Assessment:  Safety precautions to be maintained throughout the outpatient stay will include: orient to surroundings, keep bed in low position, maintain call bell within reach at all times, provide assistance with transfer out of bed and ambulation.  Medication Inspection Compliance: Pill count conducted under aseptic conditions, in front of the patient. Neither the pills nor the bottle was removed from the patient's sight at any time. Once count was completed pills were immediately returned to the patient in their original bottle.  Medication: Hydrocodone/APAP Pill/Patch Count: 10 of 60 pills remain Pill/Patch Appearance: Markings consistent with prescribed medication Bottle Appearance: Standard pharmacy container. Clearly labeled. Filled Date: 02 / 23 / 2019 Last Medication intake:  Today Pharmacokinetics: Liberation and absorption (onset of action): WNL Distribution (time to peak effect): WNL Metabolism and excretion (duration of action): WNL         Pharmacodynamics: Desired effects: Analgesia: Mr. SKrumholzreports >50% benefit. Functional ability: Patient reports that medication allows him to accomplish basic ADLs Clinically meaningful improvement in function (CMIF): Sustained CMIF goals met Perceived effectiveness: Described as  relatively  effective, allowing for increase in activities of daily living (ADL) Undesirable effects: Side-effects or Adverse reactions: None reported Monitoring: [place] PMP: Online review of the past 100-monthperiod conducted. Compliant with practice rules and regulations Last UDS on record: Summary  Date Value Ref Range Status  01/21/2017 FINAL  Final    Comment:    ==================================================================== TOXASSURE COMP DRUG ANALYSIS,UR ==================================================================== Test                             Result       Flag       Units Drug Present and Declared for Prescription Verification   Fluoxetine                     PRESENT      EXPECTED   Norfluoxetine                  PRESENT      EXPECTED    Norfluoxetine is an expected metabolite of fluoxetine. Drug Absent but Declared for Prescription Verification   Gabapentin                     Not Detected UNEXPECTED   Diclofenac                     Not Detected UNEXPECTED    Diclofenac, as indicated in the declared medication list, is not    always detected even when used as directed. ==================================================================== Test                      Result    Flag   Units      Ref Range   Creatinine              57               mg/dL      >=20 ==================================================================== Declared Medications:  The flagging and interpretation on this report are based on the  following declared medications.  Unexpected results may arise from  inaccuracies in the declared medications.  **Note: The testing scope of this panel includes these medications:  Fluoxetine  Gabapentin  **Note: The testing scope of this panel does not include small to  moderate amounts of these reported medications:  Diclofenac  **Note: The testing scope of this panel does not include following  reported medications:  Hydrochlorothiazide   Omeprazole  Sildenafil  Tamsulosin ==================================================================== For clinical consultation, please call [phone] ====================================================================    UDS interpretation: Compliant          Medication Assessment Form: Reviewed. Patient indicates being compliant with therapy Treatment compliance: Compliant Risk Assessment Profile: Aberrant behavior: See prior evaluations. None observed or detected today Comorbid factors increasing risk of overdose: See prior notes. No additional risks detected today Risk of substance use disorder (SUD): Low  ORT Scoring interpretation table:  Score <3 = Low Risk for SUD  Score between 4-7 = Moderate Risk for SUD  Score >8 = High Risk for Opioid Abuse   Risk Mitigation Strategies:  Patient Counseling: Covered Patient-Prescriber Agreement (PPA): Present and active  Notification to other healthcare providers: Done  Pharmacologic Plan: No change in therapy, at this time.             Laboratory Chemistry  Inflammation Markers (CRP: Acute Phase) (ESR: Chronic Phase) No results found for: CRP, ESRSEDRATE, LATICACIDVEN  Rheumatology Markers No results found for: Elayne Guerin, [facility]                      Renal Function Markers Lab Results  Component Value Date   BUN 13 05/23/2017   CREATININE 1.13 05/23/2017   GFRAA >60 05/23/2017   GFRNONAA >60 05/23/2017                              Hepatic Function Markers Lab Results  Component Value Date   AST 51 (H) 05/23/2017   ALT 59 05/23/2017   ALBUMIN 3.0 (L) 05/23/2017   ALKPHOS 64 05/23/2017   LIPASE 28 05/23/2017                        Electrolytes Lab Results  Component Value Date   NA 134 (L) 05/23/2017   K 3.5 05/23/2017   CL 102 05/23/2017   CALCIUM 8.4 (L) 05/23/2017                        Neuropathy Markers Lab Results  Component Value Date    HIV Non Reactive 05/21/2017                        Bone Pathology Markers Lab Results  Component Value Date   TESTOSTERONE 439 12/04/2016                         Coagulation Parameters Lab Results  Component Value Date   PLT 234 05/22/2017                        Cardiovascular Markers Lab Results  Component Value Date   HGB 13.2 05/22/2017   HCT 39.1 (L) 05/22/2017                         CA Markers No results found for: CEA, CA125, LABCA2                      Note: Lab results reviewed.  Recent Diagnostic Imaging Results  MR 3D Recon At Scanner CLINICAL DATA:  Left upper quadrant pain and nausea. Pancreatitis. Elevated lipase.  EXAM: MRI ABDOMEN WITHOUT AND WITH CONTRAST (INCLUDING MRCP)  TECHNIQUE: Multiplanar multisequence MR imaging of the abdomen was performed both before and after the administration of intravenous contrast. Heavily T2-weighted images of the biliary and pancreatic ducts were obtained, and three-dimensional MRCP images were rendered by post processing.  CONTRAST:  32m MULTIHANCE GADOBENATE DIMEGLUMINE 529 MG/ML IV SOLN  COMPARISON:  CT on 05/20/2017  FINDINGS: Image degradation by motion artifact noted.  Lower chest: No acute findings.  Hepatobiliary: No hepatic masses identified. Severe hepatic steatosis is seen. Gallbladder is unremarkable. No evidence of biliary ductal dilatation or choledocholithiasis.  Pancreas: Swelling is seen involving the pancreatic tail, with mild peripancreatic edema. No evidence of pancreatic necrosis or mass. No evidence of peripancreatic fluid collections. No evidence of pancreatic ductal dilatation or pancreas divisum.  Spleen:  Within normal limits in size and appearance.  Adrenals/Urinary Tract: No masses identified. Tiny sub-cm cyst in upper pole of left kidney. No evidence of hydronephrosis.  Stomach/Bowel: Visualized portion unremarkable.  Vascular/Lymphatic: No pathologically enlarged lymph  nodes identified. No abdominal aortic aneurysm.  Other:  None.  Musculoskeletal:  No suspicious bone lesions identified.  IMPRESSION: Mild acute pancreatitis involving the pancreatic tail. No evidence of peripancreatic fluid collection or other complication.  Severe hepatic steatosis.  Electronically Signed   By: Earle Gell M.D.   On: 05/23/2017 09:20 MR ABDOMEN MRCP W WO CONTAST CLINICAL DATA:  Left upper quadrant pain and nausea. Pancreatitis. Elevated lipase.  EXAM: MRI ABDOMEN WITHOUT AND WITH CONTRAST (INCLUDING MRCP)  TECHNIQUE: Multiplanar multisequence MR imaging of the abdomen was performed both before and after the administration of intravenous contrast. Heavily T2-weighted images of the biliary and pancreatic ducts were obtained, and three-dimensional MRCP images were rendered by post processing.  CONTRAST:  45m MULTIHANCE GADOBENATE DIMEGLUMINE 529 MG/ML IV SOLN  COMPARISON:  CT on 05/20/2017  FINDINGS: Image degradation by motion artifact noted.  Lower chest: No acute findings.  Hepatobiliary: No hepatic masses identified. Severe hepatic steatosis is seen. Gallbladder is unremarkable. No evidence of biliary ductal dilatation or choledocholithiasis.  Pancreas: Swelling is seen involving the pancreatic tail, with mild peripancreatic edema. No evidence of pancreatic necrosis or mass. No evidence of peripancreatic fluid collections. No evidence of pancreatic ductal dilatation or pancreas divisum.  Spleen:  Within normal limits in size and appearance.  Adrenals/Urinary Tract: No masses identified. Tiny sub-cm cyst in upper pole of left kidney. No evidence of hydronephrosis.  Stomach/Bowel: Visualized portion unremarkable.  Vascular/Lymphatic: No pathologically enlarged lymph nodes identified. No abdominal aortic aneurysm.  Other:  None.  Musculoskeletal:  No suspicious bone lesions identified.  IMPRESSION: Mild acute pancreatitis involving the  pancreatic tail. No evidence of peripancreatic fluid collection or other complication.  Severe hepatic steatosis.  Electronically Signed   By: JEarle GellM.D.   On: 05/23/2017 09:20  Complexity Note: Imaging results reviewed. Results shared with Mr. SMahaffy using Layman's terms.                         Meds   Current Outpatient Medications:  .  FLUoxetine (PROZAC) 20 MG capsule, Take 1 capsule (20 mg total) by mouth daily., Disp: 90 capsule, Rfl: 1 .  gabapentin (NEURONTIN) 300 MG capsule, Take 1 capsule (300 mg total) by mouth 2 (two) times daily., Disp: 60 capsule, Rfl: 2 .  hydrALAZINE (APRESOLINE) 25 MG tablet, Take 1 tablet (25 mg total) by mouth every 8 (eight) hours., Disp: 90 tablet, Rfl: 1 .  [START ON 06/17/2017] HYDROcodone-acetaminophen (NORCO) 7.5-325 MG tablet, Take 1 tablet by mouth 2 (two) times daily as needed for moderate pain. For chronic pain To last for 30 days from fill date, Disp: 60 tablet, Rfl: 0 .  nicotine (NICODERM CQ - DOSED IN MG/24 HOURS) 14 mg/24hr patch, Place 1 patch (14 mg total) onto the skin daily. (Patient not taking: Reported on 05/27/2017), Disp: 30 patch, Rfl: 0 .  omeprazole (PRILOSEC) 20 MG capsule, TAKE 1 CAPSULE (20 MG) BY MOUTH ONCE DAILY, Disp: 90 capsule, Rfl: 1 .  tamsulosin (FLOMAX) 0.4 MG CAPS capsule, Take 2 capsules (0.8 mg total) by mouth daily., Disp: 180 capsule, Rfl: 1 .  sildenafil (REVATIO) 20 MG tablet, Take 3 to 5 tablets two hours before intercouse on an empty stomach.  Do not take with nitrates., Disp: 50 tablet, Rfl: 3  ROS  Constitutional: Denies any fever or chills Gastrointestinal: No reported hemesis, hematochezia, vomiting, or acute GI distress Musculoskeletal: Denies any acute onset joint swelling, redness, loss of ROM, or weakness Neurological: No reported episodes of acute  onset apraxia, aphasia, dysarthria, agnosia, amnesia, paralysis, loss of coordination, or loss of consciousness  Allergies  Carlos Reynolds is allergic  to shellfish allergy; tape; amlodipine; and lisinopril.  PFSH  Drug: Carlos Reynolds  reports that he does not use drugs. Alcohol:  reports that he drinks about 8.4 oz of alcohol per week. Tobacco:  reports that he has been smoking cigarettes.  He has a 23.00 pack-year smoking history. He has never used smokeless tobacco. Medical:  has a past medical history of Anxiety, Arthritis, Depression, Heartburn, HLD (hyperlipidemia), Hypertension, Kidney stone, and Pancreatitis (05/20/2017). Surgical: Carlos Reynolds  has a past surgical history that includes No past surgeries; Colonoscopy; and Colonoscopy with propofol (N/A, 04/15/2017). Family: family history includes Colon cancer in his brother and mother; Diabetes in his father; Heart disease in his brother and mother; Prostate cancer in his father.  Constitutional Exam  General appearance: Well nourished, well developed, and well hydrated. In no apparent acute distress Vitals:   05/27/17 1032  BP: 138/87  Pulse: 73  Temp: 98.1 F (36.7 C)  SpO2: 100%  Weight: 217 lb (98.4 kg)  Height: 5' 10"  (1.778 m)   BMI Assessment: Estimated body mass index is 31.14 kg/m as calculated from the following:   Height as of this encounter: 5' 10"  (1.778 m).   Weight as of this encounter: 217 lb (98.4 kg).  BMI interpretation table: BMI level Category Range association with higher incidence of chronic pain  <18 kg/m2 Underweight   18.5-24.9 kg/m2 Ideal body weight   25-29.9 kg/m2 Overweight Increased incidence by 20%  30-34.9 kg/m2 Obese (Class I) Increased incidence by 68%  35-39.9 kg/m2 Severe obesity (Class II) Increased incidence by 136%  >40 kg/m2 Extreme obesity (Class III) Increased incidence by 254%   BMI Readings from Last 4 Encounters:  05/27/17 32.57 kg/m  05/27/17 31.14 kg/m  05/23/17 35.13 kg/m  05/20/17 32.05 kg/m   Wt Readings from Last 4 Encounters:  05/27/17 227 lb (103 kg)  05/27/17 217 lb (98.4 kg)  05/23/17 237 lb 14 oz (107.9 kg)   05/20/17 217 lb (98.4 kg)  Psych/Mental status: Alert, oriented x 3 (person, place, & time)       Eyes: PERLA Respiratory: No evidence of acute respiratory distress  Cervical Spine Area Exam  Skin & Axial Inspection: No masses, redness, edema, swelling, or associated skin lesions Alignment: Symmetrical Functional ROM: Unrestricted ROM      Stability: No instability detected Muscle Tone/Strength: Functionally intact. No obvious neuro-muscular anomalies detected. Sensory (Neurological): Unimpaired Palpation: No palpable anomalies              Upper Extremity (UE) Exam    Side: Right upper extremity  Side: Left upper extremity  Skin & Extremity Inspection: Skin color, temperature, and hair growth are WNL. No peripheral edema or cyanosis. No masses, redness, swelling, asymmetry, or associated skin lesions. No contractures.  Skin & Extremity Inspection: Atrophy  Functional ROM: Unrestricted ROM          Functional ROM: Decreased ROM for shoulder  Muscle Tone/Strength: Functionally intact. No obvious neuro-muscular anomalies detected.  Muscle Tone/Strength: Functionally intact. No obvious neuro-muscular anomalies detected.  Sensory (Neurological): Unimpaired          Sensory (Neurological): Arthropathic arthralgia          Palpation: No palpable anomalies              Palpation: Complains of area being tender to palpation  Specialized Test(s): Deferred         Specialized Test(s): Deferred          Thoracic Spine Area Exam  Skin & Axial Inspection: No masses, redness, or swelling Alignment: Symmetrical Functional ROM: Unrestricted ROM Stability: No instability detected Muscle Tone/Strength: Functionally intact. No obvious neuro-muscular anomalies detected. Sensory (Neurological): Unimpaired Muscle strength & Tone: No palpable anomalies  Lumbar Spine Area Exam  Skin & Axial Inspection: No masses, redness, or swelling Alignment: Symmetrical Functional ROM: Unrestricted ROM       Stability: No instability detected Muscle Tone/Strength: Functionally intact. No obvious neuro-muscular anomalies detected. Sensory (Neurological): Unimpaired Palpation: No palpable anomalies       Provocative Tests: Lumbar Hyperextension and rotation test: evaluation deferred today       Lumbar Lateral bending test: evaluation deferred today       Patrick's Maneuver: evaluation deferred today                    Gait & Posture Assessment  Ambulation: Unassisted Gait: Relatively normal for age and body habitus Posture: WNL   Lower Extremity Exam    Side: Right lower extremity  Side: Left lower extremity  Skin & Extremity Inspection: Skin color, temperature, and hair growth are WNL. No peripheral edema or cyanosis. No masses, redness, swelling, asymmetry, or associated skin lesions. No contractures.  Skin & Extremity Inspection: Skin color, temperature, and hair growth are WNL. No peripheral edema or cyanosis. No masses, redness, swelling, asymmetry, or associated skin lesions. No contractures.  Functional ROM: Unrestricted ROM          Functional ROM: Unrestricted ROM          Muscle Tone/Strength: Functionally intact. No obvious neuro-muscular anomalies detected.  Muscle Tone/Strength: Functionally intact. No obvious neuro-muscular anomalies detected.  Sensory (Neurological): Unimpaired  Sensory (Neurological): Unimpaired  Palpation: No palpable anomalies  Palpation: No palpable anomalies   Assessment  Primary Diagnosis & Pertinent Problem List: The primary encounter diagnosis was Tear of left supraspinatus tendon. Diagnoses of Chronic left shoulder pain, Primary osteoarthritis of left shoulder, Depression, unspecified depression type, and Chronic pain syndrome were also pertinent to this visit.  Status Diagnosis  Worsening Persistent Persistent 1. Tear of left supraspinatus tendon   2. Chronic left shoulder pain   3. Primary osteoarthritis of left shoulder   4. Depression,  unspecified depression type   5. Chronic pain syndrome     General Recommendations: The pain condition that the patient suffers from is best treated with a multidisciplinary approach that involves an increase in physical activity to prevent de-conditioning and worsening of the pain cycle, as well as psychological counseling (formal and/or informal) to address the co-morbid psychological affects of pain. Treatment will often involve judicious use of pain medications and interventional procedures to decrease the pain, allowing the patient to participate in the physical activity that will ultimately produce long-lasting pain reductions. The goal of the multidisciplinary approach is to return the patient to a higher level of overall function and to restore their ability to perform activities of daily living.   61 year old male with a history of left shoulder pain secondary to left shoulder osteoarthritis, tear of left supraspinatus tendon that was seen on his most recent left shoulder MRI.  Patient has significant immobility of his left shoulder as well as severe pain which results in significant disability.  Patient did have an episode of pancreatitis for which she was admitted to the hospital.  Patient states that he  did fill 20 tablets of oxycodone 5 mg that were prescribed to him after his hospitalization but has not taken any.  He is continuing his hydrocodone 7.5 mg twice daily as needed that are prescribed by me.  I will referred the patient to orthopedics for further evaluation of his left shoulder supraspinatus tendon tear.  Plan of Care  Pharmacotherapy (Medications Ordered): Meds ordered this encounter  Medications  . HYDROcodone-acetaminophen (NORCO) 7.5-325 MG tablet    Sig: Take 1 tablet by mouth 2 (two) times daily as needed for moderate pain. For chronic pain To last for 30 days from fill date    Dispense:  60 tablet    Refill:  0   Lab-work, procedure(s), and/or referral(s): Orders  Placed This Encounter  Procedures  . Ambulatory referral to Orthopedic Surgery    Provider-requested follow-up: Return in about 7 weeks (around 07/15/2017) for Medication Management. Time Note: Greater than 50% of the 25 minute(s) of face-to-face time spent with Carlos Reynolds, was spent in counseling/coordination of care regarding: the results of his recent test(s), the significance of each one oth the test(s) anomalies and it's corresponding characteristic pain pattern(s), the treatment plan, treatment alternatives and the medication agreement. Future Appointments  Date Time Provider Faith  05/30/2017 11:00 AM Mikey College, NP [facility] None  07/15/2017 10:30 AM Gillis Santa, MD [facility] None  07/22/2017 11:00 AM Virgel Manifold, MD [facility] None  11/20/2017 10:30 AM [facility] None  11/27/2017 10:00 AM McGowan, Marlowe Kays None    Primary Care Physician: Mikey College, NP Location: [facility] Out[facility] Note by: Gillis Santa, M.D Date: 05/27/2017; Time: 12:51 PM  There are no Patient Instructions on file for this visit.

## 2017-05-30 ENCOUNTER — Inpatient Hospital Stay: Payer: No Typology Code available for payment source | Admitting: Nurse Practitioner

## 2017-06-03 ENCOUNTER — Telehealth: Payer: Self-pay | Admitting: Student in an Organized Health Care Education/Training Program

## 2017-06-03 NOTE — Telephone Encounter (Signed)
Insurance thru wife's work will only cover [facility] for his [facility]. He has appt. 06-05-17 at 2:45. He could not get on with providers we referred him to. He wanted to let office and Dr. Cherylann RatelLateef know.

## 2017-06-18 ENCOUNTER — Telehealth: Payer: Self-pay | Admitting: Student in an Organized Health Care Education/Training Program

## 2017-06-18 NOTE — Telephone Encounter (Signed)
Patient was in hospital and received script for 20 percocet and his wife filled it while he was out of town. He had talked to Dr. Cherylann RatelLateef about the script and Dr. Cherylann RatelLateef told him not to fill it to tear it up, but he forgot to tell his wife and she had it filled. He has not taken any of the percocet. He went to pharmacy to get his regular meds and they will not let him fill his reg meds until we give pharmacy a call. Please call patient about what to do with percocet and how he can get his meds filled. [place]

## 2017-06-18 NOTE — Telephone Encounter (Signed)
Dr. Cherylann RatelLateef, please advise on what to tell the patient/pharmacy.

## 2017-06-19 ENCOUNTER — Encounter: Payer: Self-pay | Admitting: Nurse Practitioner

## 2017-06-19 ENCOUNTER — Other Ambulatory Visit: Payer: Self-pay

## 2017-06-19 ENCOUNTER — Ambulatory Visit (INDEPENDENT_AMBULATORY_CARE_PROVIDER_SITE_OTHER): Payer: No Typology Code available for payment source | Admitting: Nurse Practitioner

## 2017-06-19 VITALS — BP 118/66 | HR 78 | Temp 97.7°F | Ht 70.0 in | Wt 223.6 lb

## 2017-06-19 DIAGNOSIS — M25511 Pain in right shoulder: Secondary | ICD-10-CM

## 2017-06-19 DIAGNOSIS — Z8719 Personal history of other diseases of the digestive system: Secondary | ICD-10-CM

## 2017-06-19 DIAGNOSIS — I10 Essential (primary) hypertension: Secondary | ICD-10-CM

## 2017-06-19 DIAGNOSIS — R3914 Feeling of incomplete bladder emptying: Secondary | ICD-10-CM

## 2017-06-19 DIAGNOSIS — N401 Enlarged prostate with lower urinary tract symptoms: Secondary | ICD-10-CM

## 2017-06-19 DIAGNOSIS — G8929 Other chronic pain: Secondary | ICD-10-CM

## 2017-06-19 LAB — COMPLETE METABOLIC PANEL WITHOUT GFR
AG Ratio: 1.5 (calc) (ref 1.0–2.5)
ALT: 30 U/L (ref 9–46)
AST: 32 U/L (ref 10–35)
Albumin: 4.2 g/dL (ref 3.6–5.1)
Alkaline phosphatase (APISO): 71 U/L (ref 40–115)
BUN: 20 mg/dL (ref 7–25)
CO2: 27 mmol/L (ref 20–32)
Calcium: 9.1 mg/dL (ref 8.6–10.3)
Chloride: 107 mmol/L (ref 98–110)
Creat: 1.07 mg/dL (ref 0.70–1.25)
GFR, Est African American: 86 mL/min/1.73m2
GFR, Est Non African American: 75 mL/min/1.73m2
Globulin: 2.8 g/dL (ref 1.9–3.7)
Glucose, Bld: 89 mg/dL (ref 65–99)
Potassium: 3.9 mmol/L (ref 3.5–5.3)
Sodium: 140 mmol/L (ref 135–146)
Total Bilirubin: 0.8 mg/dL (ref 0.2–1.2)
Total Protein: 7 g/dL (ref 6.1–8.1)

## 2017-06-19 LAB — LIPID PANEL
Cholesterol: 197 mg/dL
HDL: 40 mg/dL — ABNORMAL LOW
LDL Cholesterol (Calc): 132 mg/dL — ABNORMAL HIGH
Non-HDL Cholesterol (Calc): 157 mg/dL — ABNORMAL HIGH
Total CHOL/HDL Ratio: 4.9 (calc)
Triglycerides: 137 mg/dL

## 2017-06-19 MED ORDER — TAMSULOSIN HCL 0.4 MG PO CAPS
0.8000 mg | ORAL_CAPSULE | Freq: Every day | ORAL | 1 refills | Status: DC
Start: 2017-06-19 — End: 2017-10-14

## 2017-06-19 NOTE — Patient Instructions (Addendum)
[name],   Thank you for coming in to clinic today.  1. tamsulosin is a generic drug provided with a VIC card at [facility].  It is listed online as $6 for 30 days. - Consider medication management clinic as well for prescriptions.  I have provided you with an application.  [facility] provides this service.  2. Call your charity care advisor and ask about surgeon costs for your surgery.  If you find answers, please let us know.  We will also work on it from our side regarding your referral to orthopedics.  3. Continue all medications without changes.  4. We will send your lab results to your myChart account.  Please schedule a follow-up appointment with Carlos Reynolds Itai Barbian, AGNP. Return in about 3 months (around 09/18/2017) for hypertension.  If you have any other questions or concerns, please feel free to call the clinic or send a message through MyChart. You may also schedule an earlier appointment if necessary.  You will receive a survey after today's visit either digitally by e-mail or paper by [employer] mail. Your experiences and feedback matter to us.  Please respond so we know how we are doing as we provide care for you.   Carlos Reynolds Prabhleen Montemayor, DNP, AGNP-BC Adult Gerontology Nurse Practitioner [facility], [facility]

## 2017-06-19 NOTE — Progress Notes (Signed)
Subjective:    Patient ID: Carlos Reynolds, male    DOB: [birth year], 62 y.o.   MRN: [id number]  Carlos Reynolds is a 62 y.o. male presenting on 06/19/2017 for Hypertension   HPI R shoulder pain Rotator cuff and tendon repair was approved through [facility], but performs surgery at [place].  No charity care there.  Pt requests alternative orthopedic surgery referral that will be through [facility].  Hypertension - He is not checking BP at home or outside of clinic.    - Current medications are working well for patient without side effects - He is not currently symptomatic. - Pt denies headache, lightheadedness, dizziness, changes in vision, chest tightness/pressure, palpitations, leg swelling, sudden loss of speech or loss of consciousness. - He  reports no regular exercise routine. - His diet is moderate in salt, moderate in fat, and moderate in carbohydrates.    Pancreatitis: hospitalized 05/21/2017 - Has stopped drinking - Is doing much better. - No cholesterol assessment at that time.  Social History   Tobacco Use  . Smoking status: Current Every Day Smoker    Packs/day: 0.50    Years: 46.00    Pack years: 23.00    Types: Cigarettes  . Smokeless tobacco: Never Used  Substance Use Topics  . Alcohol use: Yes    Alcohol/week: 8.4 oz    Types: 14 Shots of liquor per week    Comment: 2 drinks of Liquor daily  . Drug use: No    Review of Systems Per HPI unless specifically indicated above     Objective:    BP 118/66 (BP Location: Right Arm, Patient Position: Sitting, Cuff Size: Normal)   Pulse 78   Temp 97.7 F (36.5 C) (Oral)   Ht 5\' 10"  (1.778 m)   Wt 223 lb 9.6 oz (101.4 kg)   BMI 32.08 kg/m   Wt Readings from Last 3 Encounters:  06/19/17 223 lb 9.6 oz (101.4 kg)  05/27/17 227 lb (103 kg)  05/27/17 217 lb (98.4 kg)    Physical Exam  Constitutional: He is oriented to person, place, and time. He appears well-developed and well-nourished.  No distress.  Neck: Normal range of motion. Neck supple. Carotid bruit is not present.  Cardiovascular: Normal rate, regular rhythm, S1 normal, S2 normal, normal heart sounds and intact distal pulses.  Pulmonary/Chest: Effort normal and breath sounds normal. No respiratory distress.  Abdominal: Soft. Bowel sounds are normal. He exhibits no distension. There is no hepatosplenomegaly. There is no tenderness. No hernia.  Musculoskeletal: He exhibits no edema (pedal).  Right Shoulder Inspection: Normal appearance bilateral symmetrical Palpation: Non-tender to palpation over anterior, lateral, or posterior shoulder  ROM: Limited abduction, internal and external rotation.  Intact active ROM forward flexion Special Testing: Rotator cuff testing positive for weakness w and painith supraspinatus full can and empty can test, O'brien's negative for labral pain, Hawkin's AC impingement negative for pain Strength: Normal strength 5/5 flex/ext, ext rot / int rot, grip, rotator cuff str testing. Neurovascular: Distally intact pulses, sensation to light touch  Neurological: He is alert and oriented to person, place, and time.  Skin: Skin is warm and dry.  Psychiatric: He has a normal mood and affect. His behavior is normal.  Vitals reviewed.   Results for orders placed or performed in visit on 05/27/17  Microscopic Examination  Result Value Ref Range   WBC, UA 0-5 0 - 5 /hpf   RBC, UA None seen 0 - 2 /hpf  Epithelial Cells (non renal) 0-10 0 - 10 /hpf   Mucus, UA Present (A) Not Estab.   Bacteria, UA Few (A) None seen/Few  Urinalysis, Complete  Result Value Ref Range   Specific Gravity, UA >1.030 (H) 1.005 - 1.030   pH, UA 5.5 5.0 - 7.5   Color, UA Yellow Yellow   Appearance Ur Clear Clear   Leukocytes, UA Negative Negative   Protein, UA Trace (A) Negative/Trace   Glucose, UA Negative Negative   Ketones, UA Negative Negative   RBC, UA Negative Negative   Bilirubin, UA Negative Negative    Urobilinogen, Ur 0.2 0.2 - 1.0 mg/dL   Nitrite, UA Negative Negative   Microscopic Examination See below:   Bladder Scan (Post Void Residual) in office  Result Value Ref Range   Scan Result 19       Assessment & Plan:   Problem List Items Addressed This Visit      Cardiovascular and Mediastinum   Essential hypertension    Currently controlled hypertension.  BP at goal.  Pt is working on lifestyle modifications.  Taking medications tolerating well without side effects. No currently identified complications.  Plan: 1. Continue taking hydrochlorothiazide 25 mg once daily 2. Encouraged heart healthy diet and increasing exercise to 30 minutes most days of the week. 4. Check BP 1-2 x per week at home, keep log, and bring to clinic at next appointment. 5. Follow up 6 months.        Relevant Orders   Lipid panel (Completed)   Hemoglobin A1c (Completed)   COMPLETE METABOLIC PANEL WITH GFR (Completed)     Genitourinary   Benign prostatic hyperplasia with incomplete bladder emptying    Controlled currently.  Is now reconnected with urology office.  Pt has been seen recently by Michiel CowboyShannon McGowan.  Pt notes will run out of tamsulosin prior to his next urology visit.  Will provide refill today, but should transition prescriptions to urology.      Relevant Medications   tamsulosin (FLOMAX) 0.4 MG CAPS capsule    Other Visit Diagnoses    History of acute pancreatitis    -  Primary   Relevant Orders   Lipid panel (Completed)   Hemoglobin A1c (Completed)   COMPLETE METABOLIC PANEL WITH GFR (Completed)   Chronic right shoulder pain        #1 history of pancreatitis. Resolved pancreatitis episode x 1 month ago. Pt without lipid check.  CHeck labs today.  Concern for hypertriglyceridemia.  Continue alcohol cessation.  Encouraged low glycemic diet.  #2 Shoulder pain remains consistent with rotator cuff injury.  Pt declines PT.  Needs new referral to orthopedics.  Requested pt to contact  charity care representative for more information and we will send referral if needed.    Meds ordered this encounter  Medications  . tamsulosin (FLOMAX) 0.4 MG CAPS capsule    Sig: Take 2 capsules (0.8 mg total) by mouth daily.    Dispense:  180 capsule    Refill:  1    Dispense generic only    Order Specific Question:   Supervising Provider    Answer:   Smitty CordsKARAMALEGOS, ALEXANDER J [[id number]]    Follow up plan: Return in about 3 months (around 09/18/2017) for hypertension.  Wilhelmina McardleLauren Teng Decou, DNP, AGPCNP-BC Adult Gerontology Primary Care Nurse Practitioner [facility] 06/19/2017, 11:28 AM

## 2017-06-20 LAB — HEMOGLOBIN A1C
Hgb A1c MFr Bld: 5.5 % of total Hgb (ref <5.7)
Mean Plasma Glucose: 111 (calc)
eAG (mmol/L): 6.2 (calc)

## 2017-07-15 ENCOUNTER — Ambulatory Visit
Payer: No Typology Code available for payment source | Attending: Student in an Organized Health Care Education/Training Program | Admitting: Student in an Organized Health Care Education/Training Program

## 2017-07-15 ENCOUNTER — Other Ambulatory Visit: Payer: Self-pay

## 2017-07-15 ENCOUNTER — Encounter: Payer: Self-pay | Admitting: Student in an Organized Health Care Education/Training Program

## 2017-07-15 VITALS — BP 155/102 | HR 53 | Temp 97.7°F | Resp 16 | Ht 70.0 in | Wt 223.0 lb

## 2017-07-15 DIAGNOSIS — Z87442 Personal history of urinary calculi: Secondary | ICD-10-CM | POA: Diagnosis not present

## 2017-07-15 DIAGNOSIS — N529 Male erectile dysfunction, unspecified: Secondary | ICD-10-CM | POA: Diagnosis not present

## 2017-07-15 DIAGNOSIS — M19012 Primary osteoarthritis, left shoulder: Secondary | ICD-10-CM | POA: Diagnosis not present

## 2017-07-15 DIAGNOSIS — Z800 Family history of malignant neoplasm of digestive organs: Secondary | ICD-10-CM | POA: Insufficient documentation

## 2017-07-15 DIAGNOSIS — Z91013 Allergy to seafood: Secondary | ICD-10-CM | POA: Diagnosis not present

## 2017-07-15 DIAGNOSIS — E785 Hyperlipidemia, unspecified: Secondary | ICD-10-CM | POA: Diagnosis not present

## 2017-07-15 DIAGNOSIS — F17210 Nicotine dependence, cigarettes, uncomplicated: Secondary | ICD-10-CM | POA: Diagnosis not present

## 2017-07-15 DIAGNOSIS — K219 Gastro-esophageal reflux disease without esophagitis: Secondary | ICD-10-CM | POA: Insufficient documentation

## 2017-07-15 DIAGNOSIS — M75102 Unspecified rotator cuff tear or rupture of left shoulder, not specified as traumatic: Secondary | ICD-10-CM | POA: Insufficient documentation

## 2017-07-15 DIAGNOSIS — G894 Chronic pain syndrome: Secondary | ICD-10-CM

## 2017-07-15 DIAGNOSIS — Z8042 Family history of malignant neoplasm of prostate: Secondary | ICD-10-CM | POA: Diagnosis not present

## 2017-07-15 DIAGNOSIS — K760 Fatty (change of) liver, not elsewhere classified: Secondary | ICD-10-CM | POA: Insufficient documentation

## 2017-07-15 DIAGNOSIS — M75100 Unspecified rotator cuff tear or rupture of unspecified shoulder, not specified as traumatic: Secondary | ICD-10-CM | POA: Diagnosis not present

## 2017-07-15 DIAGNOSIS — N401 Enlarged prostate with lower urinary tract symptoms: Secondary | ICD-10-CM | POA: Insufficient documentation

## 2017-07-15 DIAGNOSIS — Z833 Family history of diabetes mellitus: Secondary | ICD-10-CM | POA: Diagnosis not present

## 2017-07-15 DIAGNOSIS — Z8249 Family history of ischemic heart disease and other diseases of the circulatory system: Secondary | ICD-10-CM | POA: Insufficient documentation

## 2017-07-15 DIAGNOSIS — Z888 Allergy status to other drugs, medicaments and biological substances status: Secondary | ICD-10-CM | POA: Insufficient documentation

## 2017-07-15 DIAGNOSIS — Z79891 Long term (current) use of opiate analgesic: Secondary | ICD-10-CM | POA: Diagnosis not present

## 2017-07-15 DIAGNOSIS — G8929 Other chronic pain: Secondary | ICD-10-CM

## 2017-07-15 DIAGNOSIS — F32A Depression, unspecified: Secondary | ICD-10-CM

## 2017-07-15 DIAGNOSIS — I10 Essential (primary) hypertension: Secondary | ICD-10-CM | POA: Insufficient documentation

## 2017-07-15 DIAGNOSIS — M25512 Pain in left shoulder: Secondary | ICD-10-CM

## 2017-07-15 DIAGNOSIS — F419 Anxiety disorder, unspecified: Secondary | ICD-10-CM | POA: Diagnosis not present

## 2017-07-15 DIAGNOSIS — F329 Major depressive disorder, single episode, unspecified: Secondary | ICD-10-CM | POA: Diagnosis not present

## 2017-07-15 DIAGNOSIS — K859 Acute pancreatitis without necrosis or infection, unspecified: Secondary | ICD-10-CM | POA: Diagnosis not present

## 2017-07-15 MED ORDER — HYDROCODONE-ACETAMINOPHEN 7.5-325 MG PO TABS: 1.0000 | ORAL_TABLET | Freq: Two times a day (BID) | ORAL | 0 refills | Status: DC | PRN

## 2017-07-15 NOTE — Progress Notes (Signed)
Nursing Pain Medication Assessment:  Safety precautions to be maintained throughout the outpatient stay will include: orient to surroundings, keep bed in low position, maintain call bell within reach at all times, provide assistance with transfer out of bed and ambulation.  Medication Inspection Compliance: Carlos Reynolds did not comply with our request to bring his pills to be counted. He was reminded that bringing the medication bottles, even when empty, is a requirement.  Medication: None brought in. Pill/Patch Count: None available to be counted. Bottle Appearance: No container available. Did not bring bottle(s) to appointment. Filled Date: N/A Last Medication intake:  Day before yesterday

## 2017-07-15 NOTE — Patient Instructions (Signed)
You have been given 3 scripts for Hydrocodone today.  You have been instructed to bring pills to all appointments.

## 2017-07-15 NOTE — Progress Notes (Signed)
Patient's Name: Carlos Reynolds  MRN: [id number]  Referring Provider: Mikey College, *  DOB: 1955-12-16  PCP: Mikey College, NP  DOS: 07/15/2017  Note by: Gillis Santa, MD  Service setting: Ambulatory outpatient  Specialty: Interventional Pain Management  Location: [facility] (AMB) Pain Management Facility    Patient type: Established   Primary Reason(s) for Visit: Encounter for prescription drug management. (Level of risk: moderate)  CC: Shoulder Pain (left)  HPI  Carlos Reynolds is a 62 y.o. year old, male patient, who comes today for a medication management evaluation. He has Osteoarthritis of left shoulder; Benign prostatic hyperplasia with incomplete bladder emptying; Erectile dysfunction; Current moderate episode of major depressive disorder without prior episode ([place]); Eczema; Depression; Chronic left shoulder pain; Chronic pain syndrome; Essential hypertension; Gastroesophageal reflux disease; and Acute pancreatitis on their problem list. His primarily concern today is the Shoulder Pain (left)  Pain Assessment: Location: Left Shoulder Radiating: to bicep Onset: More than a month ago Duration: Chronic pain Quality: Aching, Constant("popping") Severity: 9 /10 (subjective, self-reported pain score)  Note: Reported level is inconsistent with clinical observations. Clinically the patient looks like a 3/10 A 3/10 is viewed as "Moderate" and described as significantly interfering with activities of daily living (ADL). It becomes difficult to feed, bathe, get dressed, get on and off the toilet or to perform personal hygiene functions. Difficult to get in and out of bed or a chair without assistance. Very distracting. With effort, it can be ignored when deeply involved in activities.       When using our objective Pain Scale, levels between 6 and 10/10 are said to belong in an emergency room, as it progressively worsens from a 6/10, described as severely limiting, requiring emergency care not  usually available at an outpatient pain management facility. At a 6/10 level, communication becomes difficult and requires great effort. Assistance to reach the emergency department may be required. Facial flushing and profuse sweating along with potentially dangerous increases in heart rate and blood pressure will be evident. Effect on ADL: lifting arms, anything related to doing anything with the arm Timing: Constant Modifying factors: medications, heating pad, pain cream BP: (!) 155/102  HR: (!) 53  [name] was last scheduled for an appointment on 06/18/2017 for medication management. During today's appointment we reviewed [name]'s chronic pain status, as well as his outpatient medication regimen.  Patient follows up for medication management.  Patient was evaluated for left shoulder surgery at [facility] however could not afford his presurgical co-pay.  He is looking to find an orthopedic surgeon within the [place] system who will be able to do his left shoulder surgery.  Referral has already been placed.   The patient  reports that he does not use drugs. His body mass index is 32 kg/m.  Further details on both, my assessment(s), as well as the proposed treatment plan, please see below.  Controlled Substance Pharmacotherapy Assessment REMS (Risk Evaluation and Mitigation Strategy)  Analgesic: Hydrocodone 7.5 mg twice daily as needed, quantity 4-monthMME/day: 15 mg/day.  GIgnatius Specking RN  07/15/2017 10:46 AM  Sign at close encounter Nursing Pain Medication Assessment:  Safety precautions to be maintained throughout the outpatient stay will include: orient to surroundings, keep bed in low position, maintain call bell within reach at all times, provide assistance with transfer out of bed and ambulation.  Medication Inspection Compliance: Mr. STitsworthdid not comply with our request to bring his pills to be counted. He was reminded that bringing  the medication bottles, even when empty,  is a requirement.  Medication: None brought in. Pill/Patch Count: None available to be counted. Bottle Appearance: No container available. Did not bring bottle(s) to appointment. Filled Date: N/A Last Medication intake:  Day before yesterday   Pharmacokinetics: Liberation and absorption (onset of action): WNL Distribution (time to peak effect): WNL Metabolism and excretion (duration of action): WNL         Pharmacodynamics: Desired effects: Analgesia: Mr. Collymore reports >50% benefit. Functional ability: Patient reports that medication allows him to accomplish basic ADLs Clinically meaningful improvement in function (CMIF): Sustained CMIF goals met Perceived effectiveness: Described as relatively effective, allowing for increase in activities of daily living (ADL) Undesirable effects: Side-effects or Adverse reactions: None reported Monitoring: [place] PMP: Online review of the past 77-monthperiod conducted. Compliant with practice rules and regulations Last UDS on record: Summary  Date Value Ref Range Status  01/21/2017 FINAL  Final    Comment:    ==================================================================== TOXASSURE COMP DRUG ANALYSIS,UR ==================================================================== Test                             Result       Flag       Units Drug Present and Declared for Prescription Verification   Fluoxetine                     PRESENT      EXPECTED   Norfluoxetine                  PRESENT      EXPECTED    Norfluoxetine is an expected metabolite of fluoxetine. Drug Absent but Declared for Prescription Verification   Gabapentin                     Not Detected UNEXPECTED   Diclofenac                     Not Detected UNEXPECTED    Diclofenac, as indicated in the declared medication list, is not    always detected even when used as directed. ==================================================================== Test                      Result     Flag   Units      Ref Range   Creatinine              57               mg/dL      >=20 ==================================================================== Declared Medications:  The flagging and interpretation on this report are based on the  following declared medications.  Unexpected results may arise from  inaccuracies in the declared medications.  **Note: The testing scope of this panel includes these medications:  Fluoxetine  Gabapentin  **Note: The testing scope of this panel does not include small to  moderate amounts of these reported medications:  Diclofenac  **Note: The testing scope of this panel does not include following  reported medications:  Hydrochlorothiazide  Omeprazole  Sildenafil  Tamsulosin ==================================================================== For clinical consultation, please call ([phone] ====================================================================    UDS interpretation: Compliant          Medication Assessment Form: Reviewed. Patient indicates being compliant with therapy Treatment compliance: Compliant Risk Assessment Profile: Aberrant behavior: See prior evaluations. None observed or detected today Comorbid factors increasing risk of overdose: See prior  notes. No additional risks detected today Risk of substance use disorder (SUD): Low Opioid Risk Tool - 07/15/17 1044      Personal History of Substance Abuse   Alcohol  Positive Male or Male    Illegal Drugs  Negative    Rx Drugs  Negative      Total Score   Opioid Risk Tool Scoring  3    Opioid Risk Interpretation  Low Risk      ORT Scoring interpretation table:  Score <3 = Low Risk for SUD  Score between 4-7 = Moderate Risk for SUD  Score >8 = High Risk for Opioid Abuse   Risk Mitigation Strategies:  Patient Counseling: Covered Patient-Prescriber Agreement (PPA): Present and active  Notification to other healthcare providers: Done  Pharmacologic Plan: No  change in therapy, at this time.             Laboratory Chemistry  Inflammation Markers (CRP: Acute Phase) (ESR: Chronic Phase) No results found for: CRP, ESRSEDRATE, LATICACIDVEN                       Rheumatology Markers No results found for: Elayne Guerin, [facility]                      Renal Function Markers Lab Results  Component Value Date   BUN 20 06/19/2017   CREATININE 1.07 56/70/1410   BCR NOT APPLICABLE 30/13/1438   GFRAA 86 06/19/2017   GFRNONAA 75 06/19/2017                              Hepatic Function Markers Lab Results  Component Value Date   AST 32 06/19/2017   ALT 30 06/19/2017   ALBUMIN 3.0 (L) 05/23/2017   ALKPHOS 64 05/23/2017   LIPASE 28 05/23/2017                        Electrolytes Lab Results  Component Value Date   NA 140 06/19/2017   K 3.9 06/19/2017   CL 107 06/19/2017   CALCIUM 9.1 06/19/2017                        Neuropathy Markers Lab Results  Component Value Date   HGBA1C 5.5 06/19/2017   HIV Non Reactive 05/21/2017                        Bone Pathology Markers Lab Results  Component Value Date   TESTOSTERONE 439 12/04/2016                         Coagulation Parameters Lab Results  Component Value Date   PLT 234 05/22/2017                        Cardiovascular Markers Lab Results  Component Value Date   HGB 13.2 05/22/2017   HCT 39.1 (L) 05/22/2017                         CA Markers No results found for: CEA, CA125, LABCA2                      Note: Lab results reviewed.  Recent Diagnostic Imaging Results  MR  3D Recon At Scanner CLINICAL DATA:  Left upper quadrant pain and nausea. Pancreatitis. Elevated lipase.  EXAM: MRI ABDOMEN WITHOUT AND WITH CONTRAST (INCLUDING MRCP)  TECHNIQUE: Multiplanar multisequence MR imaging of the abdomen was performed both before and after the administration of intravenous contrast. Heavily T2-weighted images of the biliary and pancreatic ducts  were obtained, and three-dimensional MRCP images were rendered by post processing.  CONTRAST:  21m MULTIHANCE GADOBENATE DIMEGLUMINE 529 MG/ML IV SOLN  COMPARISON:  CT on 05/20/2017  FINDINGS: Image degradation by motion artifact noted.  Lower chest: No acute findings.  Hepatobiliary: No hepatic masses identified. Severe hepatic steatosis is seen. Gallbladder is unremarkable. No evidence of biliary ductal dilatation or choledocholithiasis.  Pancreas: Swelling is seen involving the pancreatic tail, with mild peripancreatic edema. No evidence of pancreatic necrosis or mass. No evidence of peripancreatic fluid collections. No evidence of pancreatic ductal dilatation or pancreas divisum.  Spleen:  Within normal limits in size and appearance.  Adrenals/Urinary Tract: No masses identified. Tiny sub-cm cyst in upper pole of left kidney. No evidence of hydronephrosis.  Stomach/Bowel: Visualized portion unremarkable.  Vascular/Lymphatic: No pathologically enlarged lymph nodes identified. No abdominal aortic aneurysm.  Other:  None.  Musculoskeletal:  No suspicious bone lesions identified.  IMPRESSION: Mild acute pancreatitis involving the pancreatic tail. No evidence of peripancreatic fluid collection or other complication.  Severe hepatic steatosis.  Electronically Signed   By: JEarle GellM.D.   On: 05/23/2017 09:20 MR ABDOMEN MRCP W WO CONTAST CLINICAL DATA:  Left upper quadrant pain and nausea. Pancreatitis. Elevated lipase.  EXAM: MRI ABDOMEN WITHOUT AND WITH CONTRAST (INCLUDING MRCP)  TECHNIQUE: Multiplanar multisequence MR imaging of the abdomen was performed both before and after the administration of intravenous contrast. Heavily T2-weighted images of the biliary and pancreatic ducts were obtained, and three-dimensional MRCP images were rendered by post processing.  CONTRAST:  276mMULTIHANCE GADOBENATE DIMEGLUMINE 529 MG/ML IV SOLN  COMPARISON:  CT on  05/20/2017  FINDINGS: Image degradation by motion artifact noted.  Lower chest: No acute findings.  Hepatobiliary: No hepatic masses identified. Severe hepatic steatosis is seen. Gallbladder is unremarkable. No evidence of biliary ductal dilatation or choledocholithiasis.  Pancreas: Swelling is seen involving the pancreatic tail, with mild peripancreatic edema. No evidence of pancreatic necrosis or mass. No evidence of peripancreatic fluid collections. No evidence of pancreatic ductal dilatation or pancreas divisum.  Spleen:  Within normal limits in size and appearance.  Adrenals/Urinary Tract: No masses identified. Tiny sub-cm cyst in upper pole of left kidney. No evidence of hydronephrosis.  Stomach/Bowel: Visualized portion unremarkable.  Vascular/Lymphatic: No pathologically enlarged lymph nodes identified. No abdominal aortic aneurysm.  Other:  None.  Musculoskeletal:  No suspicious bone lesions identified.  IMPRESSION: Mild acute pancreatitis involving the pancreatic tail. No evidence of peripancreatic fluid collection or other complication.  Severe hepatic steatosis.  Electronically Signed   By: JoEarle Gell.D.   On: 05/23/2017 09:20  Complexity Note: Imaging results reviewed. Results shared with Mr. SnMatuszakusing Layman's terms.                         Meds   Current Outpatient Medications:  .  FLUoxetine (PROZAC) 20 MG capsule, Take 1 capsule (20 mg total) by mouth daily., Disp: 90 capsule, Rfl: 1 .  gabapentin (NEURONTIN) 300 MG capsule, Take 1 capsule (300 mg total) by mouth 2 (two) times daily., Disp: 60 capsule, Rfl: 2 .  HYDROcodone-acetaminophen (NORCO) 7.5-325 MG tablet, Take  1 tablet by mouth 2 (two) times daily as needed for moderate pain. For chronic pain To last for 30 days from fill date To fill on or after: 07-16-2017, 08-15-2017, 09-13-2017, Disp: 60 tablet, Rfl: 0 .  omeprazole (PRILOSEC) 20 MG capsule, TAKE 1 CAPSULE (20 MG) BY MOUTH ONCE DAILY,  Disp: 90 capsule, Rfl: 1 .  sildenafil (REVATIO) 20 MG tablet, Take 3 to 5 tablets two hours before intercouse on an empty stomach.  Do not take with nitrates., Disp: 50 tablet, Rfl: 3 .  tamsulosin (FLOMAX) 0.4 MG CAPS capsule, Take 2 capsules (0.8 mg total) by mouth daily., Disp: 180 capsule, Rfl: 1 .  hydrALAZINE (APRESOLINE) 25 MG tablet, Take 1 tablet (25 mg total) by mouth every 8 (eight) hours., Disp: 90 tablet, Rfl: 1  ROS  Constitutional: Denies any fever or chills Gastrointestinal: No reported hemesis, hematochezia, vomiting, or acute GI distress Musculoskeletal: Denies any acute onset joint swelling, redness, loss of ROM, or weakness Neurological: No reported episodes of acute onset apraxia, aphasia, dysarthria, agnosia, amnesia, paralysis, loss of coordination, or loss of consciousness  Allergies  Mr. Loor is allergic to shellfish allergy; tape; amlodipine; and lisinopril.  PFSH  Drug: Mr. Winograd  reports that he does not use drugs. Alcohol:  reports that he drinks about 8.4 oz of alcohol per week. Tobacco:  reports that he has been smoking cigarettes.  He has a 23.00 pack-year smoking history. He has never used smokeless tobacco. Medical:  has a past medical history of Anxiety, Arthritis, Depression, Heartburn, HLD (hyperlipidemia), Hypertension, Kidney stone, and Pancreatitis (05/20/2017). Surgical: Mr. Grandfield  has a past surgical history that includes No past surgeries; Colonoscopy; and Colonoscopy with propofol (N/A, 04/15/2017). Family: family history includes Colon cancer in his brother and mother; Diabetes in his father; Heart disease in his brother and mother; Prostate cancer in his father.  Constitutional Exam  General appearance: Well nourished, well developed, and well hydrated. In no apparent acute distress Vitals:   07/15/17 1035  BP: (!) 155/102  Pulse: (!) 53  Resp: 16  Temp: 97.7 F (36.5 C)  SpO2: 100%  Weight: 223 lb (101.2 kg)  Height: 5' 10"  (1.778 m)    BMI Assessment: Estimated body mass index is 32 kg/m as calculated from the following:   Height as of this encounter: 5' 10"  (1.778 m).   Weight as of this encounter: 223 lb (101.2 kg).  BMI interpretation table: BMI level Category Range association with higher incidence of chronic pain  <18 kg/m2 Underweight   18.5-24.9 kg/m2 Ideal body weight   25-29.9 kg/m2 Overweight Increased incidence by 20%  30-34.9 kg/m2 Obese (Class I) Increased incidence by 68%  35-39.9 kg/m2 Severe obesity (Class II) Increased incidence by 136%  >40 kg/m2 Extreme obesity (Class III) Increased incidence by 254%   Patient's current BMI Ideal Body weight  Body mass index is 32 kg/m. Ideal body weight: 73 kg (160 lb 15 oz) Adjusted ideal body weight: 84.3 kg (185 lb 12.2 oz)   BMI Readings from Last 4 Encounters:  07/15/17 32.00 kg/m  06/19/17 32.08 kg/m  05/27/17 32.57 kg/m  05/27/17 31.14 kg/m   Wt Readings from Last 4 Encounters:  07/15/17 223 lb (101.2 kg)  06/19/17 223 lb 9.6 oz (101.4 kg)  05/27/17 227 lb (103 kg)  05/27/17 217 lb (98.4 kg)  Psych/Mental status: Alert, oriented x 3 (person, place, & time)       Eyes: PERLA Respiratory: No evidence of acute respiratory distress  Cervical Spine Area Exam  Skin & Axial Inspection: No masses, redness, edema, swelling, or associated skin lesions Alignment: Symmetrical Functional ROM: Unrestricted ROM      Stability: No instability detected Muscle Tone/Strength: Functionally intact. No obvious neuro-muscular anomalies detected. Sensory (Neurological): Unimpaired Palpation: No palpable anomalies              Upper Extremity (UE) Exam    Side: Right upper extremity  Side: Left upper extremity  Skin & Extremity Inspection: Skin color, temperature, and hair growth are WNL. No peripheral edema or cyanosis. No masses, redness, swelling, asymmetry, or associated skin lesions. No contractures.  Skin & Extremity Inspection: Atrophy  Functional ROM:  Unrestricted ROM          Functional ROM: Decreased ROM          Muscle Tone/Strength: Functionally intact. No obvious neuro-muscular anomalies detected.  Muscle Tone/Strength: Functionally intact. No obvious neuro-muscular anomalies detected.  Sensory (Neurological): Unimpaired          Sensory (Neurological): Arthropathic arthralgia          Palpation: No palpable anomalies              Palpation: Complains of area being tender to palpation              Provocative Test(s):  Phalen's test: deferred Tinel's test: deferred Apley's scratch test (touch opposite shoulder):  Action 1 (Across chest): deferred Action 2 (Overhead): deferred Action 3 (LB reach): deferred   Provocative Test(s):  Phalen's test: deferred Tinel's test: deferred Apley's scratch test (touch opposite shoulder):  Action 1 (Across chest): Decreased ROM Action 2 (Overhead): Decreased ROM Action 3 (LB reach): Decreased ROM was    Thoracic Spine Area Exam  Skin & Axial Inspection: No masses, redness, or swelling Alignment: Symmetrical Functional ROM: Unrestricted ROM Stability: No instability detected Muscle Tone/Strength: Functionally intact. No obvious neuro-muscular anomalies detected. Sensory (Neurological): Unimpaired Muscle strength & Tone: No palpable anomalies  Lumbar Spine Area Exam  Skin & Axial Inspection: No masses, redness, or swelling Alignment: Symmetrical Functional ROM: Unrestricted ROM       Stability: No instability detected Muscle Tone/Strength: Functionally intact. No obvious neuro-muscular anomalies detected. Sensory (Neurological): Unimpaired Palpation: No palpable anomalies       Provocative Tests: Lumbar Hyperextension/rotation test: deferred today       Lumbar quadrant test (Kemp's test): deferred today       Lumbar Lateral bending test: deferred today       Patrick's Maneuver: deferred today                   FABER test: deferred today       Thigh-thrust test: deferred today        S-I compression test: deferred today       S-I distraction test: deferred today        Gait & Posture Assessment  Ambulation: Unassisted Gait: Relatively normal for age and body habitus Posture: WNL   Lower Extremity Exam    Side: Right lower extremity  Side: Left lower extremity  Stability: No instability observed          Stability: No instability observed          Skin & Extremity Inspection: Skin color, temperature, and hair growth are WNL. No peripheral edema or cyanosis. No masses, redness, swelling, asymmetry, or associated skin lesions. No contractures.  Skin & Extremity Inspection: Skin color, temperature, and hair growth are WNL. No peripheral edema or cyanosis.  No masses, redness, swelling, asymmetry, or associated skin lesions. No contractures.  Functional ROM: Unrestricted ROM                  Functional ROM: Unrestricted ROM                  Muscle Tone/Strength: Functionally intact. No obvious neuro-muscular anomalies detected.  Muscle Tone/Strength: Functionally intact. No obvious neuro-muscular anomalies detected.  Sensory (Neurological): Unimpaired  Sensory (Neurological): Unimpaired  Palpation: No palpable anomalies  Palpation: No palpable anomalies   Assessment  Primary Diagnosis & Pertinent Problem List: The primary encounter diagnosis was Tear of left supraspinatus tendon. Diagnoses of Chronic left shoulder pain, Primary osteoarthritis of left shoulder, Depression, unspecified depression type, Chronic pain syndrome, and Chronic prescription opiate use were also pertinent to this visit.  Status Diagnosis  Persistent Persistent Persistent 1. Tear of left supraspinatus tendon   2. Chronic left shoulder pain   3. Primary osteoarthritis of left shoulder   4. Depression, unspecified depression type   5. Chronic pain syndrome   6. Chronic prescription opiate use     General Recommendations: The pain condition that the patient suffers from is best treated with a  multidisciplinary approach that involves an increase in physical activity to prevent de-conditioning and worsening of the pain cycle, as well as psychological counseling (formal and/or informal) to address the co-morbid psychological affects of pain. Treatment will often involve judicious use of pain medications and interventional procedures to decrease the pain, allowing the patient to participate in the physical activity that will ultimately produce long-lasting pain reductions. The goal of the multidisciplinary approach is to return the patient to a higher level of overall function and to restore their ability to perform activities of daily living.  62 year old male with a history of left shoulder pain secondary to left shoulder osteoarthritis, tear of left supraspinatus tendon that was seen on his most recent left shoulder MRI.  Patient has significant immobility of his left shoulder as well as severe pain which results in significant disability.  He is in the process of finding an orthopedic surgeon within the [facility] system for his left shoulder surgery.  Patient was evaluated by an orthopedist in the [place] system given his wife's insurance but was unable to afford his presurgical copayment.  Today we will refill the patient's hydrocodone as previously prescribed as below.  Patient to continue gabapentin 300 mg twice daily.  Does not need refill on gabapentin.  Patient will follow-up in 3 months for medication management.  Plan of Care  Pharmacotherapy (Medications Ordered): Meds ordered this encounter  Medications  . DISCONTD: HYDROcodone-acetaminophen (NORCO) 7.5-325 MG tablet    Sig: Take 1 tablet by mouth 2 (two) times daily as needed for moderate pain. For chronic pain To last for 30 days from fill date To fill on or after: 07-16-2017, 08-15-2017, 09-13-2017    Dispense:  60 tablet    Refill:  0  . DISCONTD: HYDROcodone-acetaminophen (NORCO) 7.5-325 MG tablet    Sig: Take 1 tablet by mouth 2  (two) times daily as needed for moderate pain. For chronic pain To last for 30 days from fill date To fill on or after: 07-16-2017, 08-15-2017, 09-13-2017    Dispense:  60 tablet    Refill:  0  . HYDROcodone-acetaminophen (NORCO) 7.5-325 MG tablet    Sig: Take 1 tablet by mouth 2 (two) times daily as needed for moderate pain. For chronic pain To last for 30 days from fill  date To fill on or after: 07-16-2017, 08-15-2017, 09-13-2017    Dispense:  60 tablet    Refill:  0     Provider-requested follow-up: Return in about 3 months (around 10/15/2017) for Medication Management.  Future Appointments  Date Time Provider [place]  07/29/2017 11:15 AM Virgel Manifold, MD [facility] None  10/14/2017 10:30 AM Gillis Santa, MD [facility] None  11/20/2017 10:30 AM [facility] None  11/27/2017 10:00 AM McGowan, Hunt Oris, PA-C [facility] None   Time Note: Greater than 50% of the 25 minute(s) of face-to-face time spent with Mr. Kucher, was spent in counseling/coordination of care regarding: Mr. Tassinari primary cause of pain, the appropriate use of his medications, realistic expectations, the medication agreement and the patient's responsibilities when it comes to controlled substances.  Primary Care Physician: Mikey College, NP Location: [facility] Note by: Gillis Santa, M.D Date: 07/15/2017; Time: 12:36 PM  Patient Instructions  You have been given 3 scripts for Hydrocodone today.  You have been instructed to bring pills to all appointments.

## 2017-07-19 ENCOUNTER — Encounter: Payer: Self-pay | Admitting: Nurse Practitioner

## 2017-07-19 NOTE — Assessment & Plan Note (Signed)
Currently controlled hypertension.  BP at goal.  Pt is working on lifestyle modifications.  Taking medications tolerating well without side effects. No currently identified complications.  Plan: 1. Continue taking hydrochlorothiazide 25 mg once daily 2. Encouraged heart healthy diet and increasing exercise to 30 minutes most days of the week. 4. Check BP 1-2 x per week at home, keep log, and bring to clinic at next appointment. 5. Follow up 6 months.

## 2017-07-19 NOTE — Assessment & Plan Note (Addendum)
Controlled currently.  Is now reconnected with urology office.  Pt has been seen recently by Michiel Cowboy.  Pt notes will run out of tamsulosin prior to his next urology visit.  Will provide refill today, but should transition prescriptions to urology.

## 2017-07-22 ENCOUNTER — Ambulatory Visit: Payer: No Typology Code available for payment source | Admitting: Gastroenterology

## 2017-07-29 ENCOUNTER — Ambulatory Visit: Payer: No Typology Code available for payment source | Admitting: Gastroenterology

## 2017-07-29 ENCOUNTER — Ambulatory Visit (INDEPENDENT_AMBULATORY_CARE_PROVIDER_SITE_OTHER): Payer: No Typology Code available for payment source | Admitting: Gastroenterology

## 2017-07-29 ENCOUNTER — Encounter: Payer: Self-pay | Admitting: Gastroenterology

## 2017-07-29 VITALS — BP 149/81 | HR 55 | Temp 98.0°F | Ht 70.0 in | Wt 225.4 lb

## 2017-07-29 DIAGNOSIS — K760 Fatty (change of) liver, not elsewhere classified: Secondary | ICD-10-CM | POA: Diagnosis not present

## 2017-07-29 DIAGNOSIS — K852 Alcohol induced acute pancreatitis without necrosis or infection: Secondary | ICD-10-CM | POA: Diagnosis not present

## 2017-07-29 NOTE — Patient Instructions (Signed)
F/U 6 months Please ask your primary care provider to give you the Hep A and Hep B vaccination.  Acute Pancreatitis Acute pancreatitis is a condition in which the pancreas suddenly gets irritated and swollen (has inflammation). The pancreas is a large gland behind the stomach. It makes enzymes that help to digest food. The pancreas also makes hormones that help to control your blood sugar. Acute pancreatitis happens when the enzymes attack the pancreas and damage it. Most attacks last a couple of days and can cause serious problems. Follow these instructions at home: Eating and drinking  Follow instructions from your doctor about diet. You may need to: ? Avoid alcohol. ? Limit how much fat is in your diet.  Eat small meals often. Avoid eating big meals.  Drink enough fluid to keep your pee (urine) clear or pale yellow.  Do not drink alcohol if it caused your condition. General instructions  Take over-the-counter and prescription medicines only as told by your doctor.  Do not use any tobacco products. These include cigarettes, chewing tobacco, and e-cigarettes. If you need help quitting, ask your doctor.  Get plenty of rest.  If directed, check your blood sugar at home as told by your doctor.  Keep all follow-up visits as told by your doctor. This is important. Contact a doctor if:  You do not get better as quickly as expected.  You have new symptoms.  Your symptoms get worse.  You have lasting pain or weakness.  You continue to feel sick to your stomach (nauseous).  You get better and then you have another pain attack.  You have a fever. Get help right away if:  You cannot eat or keep fluids down.  Your pain becomes very bad.  Your skin or the white part of your eyes turns yellow (jaundice).  You throw up (vomit).  You feel dizzy or you pass out (faint).  Your blood sugar is high (over 300 mg/dL). This information is not intended to replace advice given to you  by your health care provider. Make sure you discuss any questions you have with your health care provider. Document Released: 07/31/2007 Document Revised: 07/20/2015 Document Reviewed: 11/15/2014 Elsevier Interactive Patient Education  2018 Else[employer]  Fatty Liver Fatty liver, also called hepatic steatosis or steatohepatitis, is a condition in which too much fat has built up in your liver cells. The liver removes harmful substances from your bloodstream. It produces fluids your body needs. It also helps your body use and store energy from the food you eat. In many cases, fatty liver does not cause symptoms or problems. It is often diagnosed when tests are being done for other reasons. However, over time, fatty liver can cause inflammation that may lead to more serious liver problems, such as scarring of the liver (cirrhosis). What are the causes? Causes of fatty liver may include:  Drinking too much alcohol.  Poor nutrition.  Obesity.  Cushing syndrome.  Diabetes.  Hyperlipidemia.  Pregnancy.  Certain drugs.  Poisons.  Some viral infections.  What increases the risk? You may be more likely to develop fatty liver if you:  Abuse alcohol.  Are pregnant.  Are overweight.  Have diabetes.  Have hepatitis.  Have a high triglyceride level.  What are the signs or symptoms? Fatty liver often does not cause any symptoms. In cases where symptoms develop, they can include:  Fatigue.  Weakness.  Weight loss.  Confusion.  Abdominal pain.  Yellowing of your skin and the white  parts of your eyes (jaundice).  Nausea and vomiting.  How is this diagnosed? Fatty liver may be diagnosed by:  Physical exam and medical history.  Blood tests.  Imaging tests, such as an ultrasound, CT scan, or MRI.  Liver biopsy. A small sample of liver tissue is removed using a needle. The sample is then looked at under a microscope.  How is this treated? Fatty liver is often  caused by other health conditions. Treatment for fatty liver may involve medicines and lifestyle changes to manage conditions such as:  Alcoholism.  High cholesterol.  Diabetes.  Being overweight or obese.  Follow these instructions at home:  Eat a healthy diet as directed by your health care provider.  Exercise regularly. This can help you lose weight and control your cholesterol and diabetes. Talk to your health care provider about an exercise plan and which activities are best for you.  Do not drink alcohol.  Take medicines only as directed by your health care provider. Contact a health care provider if: You have difficulty controlling your:  Blood sugar.  Cholesterol.  Alcohol consumption.  Get help right away if:  You have abdominal pain.  You have jaundice.  You have nausea and vomiting. This information is not intended to replace advice given to you by your health care provider. Make sure you discuss any questions you have with your health care provider. Document Released: 03/29/2005 Document Revised: 07/20/2015 Document Reviewed: 06/23/2013 Elsevier Interactive Patient Education  [employer]2018 Elsevier Inc.

## 2017-07-29 NOTE — Progress Notes (Addendum)
Carlos BouillonVarnita Sang Blount, MD [address]  [address]  [place], [place] [address]  Main: [phone]  Fax: [phone]   Primary Care Physician: Carlos Reynolds, Carlos Renee, NP  Primary Gastroenterologist:  Dr. Melodie BouillonVarnita Jahziel Reynolds  Chief Complaint  Patient presents with  . Follow-up    Hepatic steatosis    HPI: Carlos Reynolds is a 62 y.o. male here for posthospitalization follow-up.  Patient was in the hospital in March 2019 with acute pancreatitis from alcohol use.  Severe hepatic steatosis was also noted on imaging.  During the hospital admission, bilirubin was noted to be mildly elevated to 1.5, and is normal on repeat testing in April 2019 by primary care provider.  Patient reports, that he has cut down his alcohol use, but still drinks 2 beers daily.  Prior to that was drinking 1/5 of vodka bottle.  Also smokes.  No abdominal pain, nausea or vomiting, altered bowel habits.  No previous EGDs.  Colonoscopy up-to-date February 2019  Current Outpatient Medications  Medication Sig Dispense Refill  . FLUoxetine (PROZAC) 20 MG capsule Take 1 capsule (20 mg total) by mouth daily. 90 capsule 1  . gabapentin (NEURONTIN) 300 MG capsule Take 1 capsule (300 mg total) by mouth 2 (two) times daily. 60 capsule 2  . hydrALAZINE (APRESOLINE) 25 MG tablet Take 1 tablet (25 mg total) by mouth every 8 (eight) hours. 90 tablet 1  . HYDROcodone-acetaminophen (NORCO) 7.5-325 MG tablet Take 1 tablet by mouth 2 (two) times daily as needed for moderate pain. For chronic pain To last for 30 days from fill date To fill on or after: 07-16-2017, 08-15-2017, 09-13-2017 60 tablet 0  . omeprazole (PRILOSEC) 20 MG capsule TAKE 1 CAPSULE (20 MG) BY MOUTH ONCE DAILY 90 capsule 1  . sildenafil (REVATIO) 20 MG tablet Take 3 to 5 tablets two hours before intercouse on an empty stomach.  Do not take with nitrates. 50 tablet 3  . tamsulosin (FLOMAX) 0.4 MG CAPS capsule Take 2 capsules (0.8 mg total) by mouth daily. 180 capsule  1   No current facility-administered medications for this visit.     Allergies as of 07/29/2017 - Review Complete 07/19/2017  Allergen Reaction Noted  . Shellfish allergy Hives 11/18/2016  . Tape  03/18/2017  . Amlodipine Other (See Comments) 10/05/2014  . Lisinopril Other (See Comments) 09/29/2014    ROS:  General: Negative for anorexia, weight loss, fever, chills, fatigue, weakness. ENT: Negative for hoarseness, difficulty swallowing , nasal congestion. CV: Negative for chest pain, angina, palpitations, dyspnea on exertion, peripheral edema.  Respiratory: Negative for dyspnea at rest, dyspnea on exertion, cough, sputum, wheezing.  GI: See history of present illness. GU:  Negative for dysuria, hematuria, urinary incontinence, urinary frequency, nocturnal urination.  Endo: Negative for unusual weight change.    Physical Examination:   There were no vitals taken for this visit.  General: Well-nourished, well-developed in no acute distress.  Eyes: No icterus. Conjunctivae pink. Mouth: Oropharyngeal mucosa moist and pink , no lesions erythema or exudate. Neck: Supple, Trachea midline Abdomen: Bowel sounds are normal, nontender, nondistended, no hepatosplenomegaly or masses, no abdominal bruits or hernia , no rebound or guarding.   Extremities: No lower extremity edema. No clubbing or deformities. Neuro: Alert and oriented x 3.  Grossly intact. Skin: Warm and dry, no jaundice.   Psych: Alert and cooperative, normal mood and affect.   Labs: CMP     Component Value Date/Time   NA 140 06/19/2017 1147   K 3.9 06/19/2017  1147   CL 107 06/19/2017 1147   CO2 27 06/19/2017 1147   GLUCOSE 89 06/19/2017 1147   BUN 20 06/19/2017 1147   CREATININE 1.07 06/19/2017 1147   CALCIUM 9.1 06/19/2017 1147   PROT 7.0 06/19/2017 1147   ALBUMIN 3.0 (L) 05/23/2017 0555   AST 32 06/19/2017 1147   ALT 30 06/19/2017 1147   ALKPHOS 64 05/23/2017 0555   BILITOT 0.8 06/19/2017 1147   GFRNONAA  75 06/19/2017 1147   GFRAA 86 06/19/2017 1147   Lab Results  Component Value Date   WBC 9.3 05/22/2017   HGB 13.2 05/22/2017   HCT 39.1 (L) 05/22/2017   MCV 90.3 05/22/2017   PLT 234 05/22/2017    Imaging Studies: No results found.  Assessment and Plan:   Carlos Reynolds is a 62 y.o. y/o male here for posthospitalization follow-up of acute alcoholic pancreatitis  Pancreatitis as clinically resolved Abstinence from alcohol and hepatotoxic drugs encouraged.  He continues to drink 2 beers daily, and smokes.  This was strongly discouraged, and risks of continued smoking and drinking as far as worsening hepatic steatosis, and recurrent pancreatitis, pancreatic cancer were discussed, and he verbalized understanding. Hepatic steatosis seen on imaging, and this was discussed with the patient in detail  He was encouraged to lose weight, and abstain from alcohol or hepatotoxic drugs.  Risks of this progressing to cirrhosis, and risk of recurrent acute pancreatitis, if the above measures are not instituted were discussed in detail and he verbalized understanding.  No clinical evidence of cirrhosis at this time, with normal platelets, normal albumin and bilirubin on latest labs.   Colonoscopy up-to-date, last colonoscopy was in February 2019 by Dr. Tobi Reynolds, done for history of polyps.  This was a normal exam and repeat recommended in 5 years.  He reports intermittent bright red blood per rectum, likely due to hemorrhoids, only happens with constipation.  He was asked to eat a high-fiber diet, and use MiraLAX, if he is not having soft stools daily.  No indication for repeat endoscopy with normal CBC, and recent normal colonoscopy.  Patient should obtain hepatitis A and B vaccinations from his primary care provider as it is not available in our clinic.  And this was discussed with him.  This note will be copied to the primary care provider as well.  Follow-up in 3 to 6 months  Dr Carlos Bouillon

## 2017-10-01 ENCOUNTER — Encounter: Payer: Self-pay | Admitting: Urology

## 2017-10-13 NOTE — Progress Notes (Signed)
10/14/2017 9:49 AM   Carlos Reynolds 10/03/1955 [id number]  Referring provider: Galen Manila, NP [address] [place], [place] [address]  Chief Complaint  Patient presents with  . Hematuria    HPI: Patient is a 62 year old African-American male with BPH and LUT S, erectile dysfunction, chronic pelvic pain and hematuria who presents today for follow up.    History of hematuria He was having gross hematuria on 04/17/2017.  His UA on 04/17/2017 was negative, but his urine culture was positive for Beta hemolytic Streptococcus, group B.  He completed a course of Augmentin 875/125, bid for seven days.  His UA  (05/01/2017) negative.   His UA (05/27/2017) negative.   He had an episode of gross hematuria at the end of his stream.  He now has a discharge.  He has not been sexually active.  Patient denies any gross hematuria, dysuria or suprapubic/flank pain.  Patient denies any fevers, chills, nausea or vomiting.  His UA today is positive for 11-30 RBC's.  He is a smoker.    Chronic pelvic pain Currently being seen at the pain clinic.    BPH WITH LUTS  (prostate and/or bladder) IPSS score: 19/4    PVR: 166 mL  Previous score: 22/5   Previous PVR: 19 mL  Major complaint(s): nocturia, a weak urinary stream and incontinence x for several months   Denies any dysuria, hematuria or suprapubic pain.   Currently taking: Nothing.  Tamsulosin and Myrbetriq were cost prohibitive.    Denies any recent fevers, chills, nausea or vomiting.  His father was diagnosed with fatal prostate cancer at age 50.    IPSS    Row Name 10/14/17 0900         International Prostate Symptom Score   How often have you had the sensation of not emptying your bladder?  About half the time     How often have you had to urinate less than every two hours?  About half the time     How often have you found you stopped and started again several times when you urinated?  About half the time     How often have you  found it difficult to postpone urination?  Almost always     How often have you had a weak urinary stream?  Less than half the time     How often have you had to strain to start urination?  Not at All     How many times did you typically get up at night to urinate?  3 Times     Total IPSS Score  19       Quality of Life due to urinary symptoms   If you were to spend the rest of your life with your urinary condition just the way it is now how would you feel about that?  Mostly Disatisfied        Score:  1-7 Mild 8-19 Moderate 20-35 Severe  Erectile dysfunction SHIM score was 13, it mild to moderate ED.  His previous SHIM score was 11.  He has been having difficulty with erections for over a year.  His major complaint is erections not firm enough.  His libido is preserved.   His risk factors for ED are age, BPH, HTN, alcohol abuse, smoking and pain medication.  He denies any painful erections or curvatures with his erections.   He is still having spontaneous erections.  He has tried three sildenafil and they  were not effective.   SHIM    Row Name 10/14/17 0920         SHIM: Over the last 6 months:   How do you rate your confidence that you could get and keep an erection?  Low     When you had erections with sexual stimulation, how often were your erections hard enough for penetration (entering your partner)?  A Few Times (much less than half the time)     During sexual intercourse, how often were you able to maintain your erection after you had penetrated (entered) your partner?  Sometimes (about half the time)     During sexual intercourse, how difficult was it to maintain your erection to completion of intercourse?  Difficult     When you attempted sexual intercourse, how often was it satisfactory for you?  Sometimes (about half the time)       SHIM Total Score   SHIM  13        Score: 1-7 Severe ED 8-11 Moderate ED 12-16 Mild-Moderate ED 17-21 Mild ED 22-25 No  ED  PMH: Past Medical History:  Diagnosis Date  . Anxiety   . Arthritis   . Depression   . Heartburn   . HLD (hyperlipidemia)   . Hypertension   . Kidney stone   . Pancreatitis 05/20/2017    Surgical History: Past Surgical History:  Procedure Laterality Date  . COLONOSCOPY    . COLONOSCOPY WITH PROPOFOL N/A 04/15/2017   Procedure: COLONOSCOPY WITH PROPOFOL;  Surgeon: Wyline Mood, MD;  Location: [facility] ENDOSCOPY;  Service: Gastroenterology;  Laterality: N/A;  . NO PAST SURGERIES     verified with patient on 01/15/17    Home Medications:  Allergies as of 10/14/2017      Reactions   Shellfish Allergy Hives   Tape    Brown, elastic tape used by physical therapist; skin breaks out   Amlodipine Other (See Comments)   Possible hand swelling   Lisinopril Other (See Comments)   Cough      Medication List        Accurate as of 10/14/17  9:49 AM. Always use your most recent med list.          alfuzosin 10 MG 24 hr tablet Commonly known as:  UROXATRAL Take 1 tablet (10 mg total) by mouth daily with breakfast.   diclofenac 75 MG EC tablet Commonly known as:  VOLTAREN Take by mouth.   diphenhydrAMINE 50 MG tablet Commonly known as:  BENADRYL Take one hour prior to the CT Urogram   FLUoxetine 20 MG capsule Commonly known as:  PROZAC Take 1 capsule (20 mg total) by mouth daily.   gabapentin 300 MG capsule Commonly known as:  NEURONTIN Take 1 capsule (300 mg total) by mouth 2 (two) times daily.   hydrALAZINE 25 MG tablet Commonly known as:  APRESOLINE Take 1 tablet (25 mg total) by mouth every 8 (eight) hours.   hydrALAZINE 25 MG tablet Commonly known as:  APRESOLINE Take 25 mg by mouth every 8 (eight) hours.   HYDROcodone-acetaminophen 7.5-325 MG tablet Commonly known as:  NORCO Take 1 tablet by mouth 2 (two) times daily as needed for moderate pain. For chronic pain To last for 30 days from fill date To fill on or after: 07-16-2017, 08-15-2017, 09-13-2017    omeprazole 20 MG capsule Commonly known as:  PRILOSEC TAKE 1 CAPSULE BY MOUTH ONCE DAILY   predniSONE 50 MG tablet Commonly known as:  DELTASONE Take  the one tablet 13 hours, 7 hours and 1 hours prior to the CT Urogram   ranitidine 150 MG tablet Commonly known as:  ZANTAC Take one tablet 1 hour prior to CT Urogram   sildenafil 20 MG tablet Commonly known as:  REVATIO Take 3 to 5 tablets two hours before intercouse on an empty stomach.  Do not take with nitrates.   tamsulosin 0.4 MG Caps capsule Commonly known as:  FLOMAX Take 2 capsules (0.8 mg total) by mouth daily.       Allergies:  Allergies  Allergen Reactions  . Shellfish Allergy Hives  . Tape     Brown, elastic tape used by physical therapist; skin breaks out  . Amlodipine Other (See Comments)    Possible hand swelling  . Lisinopril Other (See Comments)    Cough    Family History: Family History  Problem Relation Age of Onset  . Heart disease Mother   . Colon cancer Mother   . Diabetes Father   . Prostate cancer Father   . Colon cancer Brother   . Heart disease Brother   . Kidney cancer Neg Hx   . Bladder Cancer Neg Hx     Social History:  reports that he has been smoking cigarettes. He has a 23.00 pack-year smoking history. He has never used smokeless tobacco. He reports that he drinks about 1.0 standard drinks of alcohol per week. He reports that he does not use drugs.  ROS: UROLOGY Frequent Urination?: No Hard to postpone urination?: No Burning/pain with urination?: No Get up at night to urinate?: No Leakage of urine?: No Urine stream starts and stops?: No Trouble starting stream?: No Do you have to strain to urinate?: No Blood in urine?: Yes Urinary tract infection?: No Sexually transmitted disease?: No Injury to kidneys or bladder?: No Painful intercourse?: No Weak stream?: No Erection problems?: No Penile pain?: No  Gastrointestinal Nausea?: No Vomiting?: No Indigestion/heartburn?:  No Diarrhea?: No Constipation?: No  Constitutional Fever: No Night sweats?: No Weight loss?: No Fatigue?: No  Skin Skin rash/lesions?: Yes Itching?: No  Eyes Blurred vision?: Yes Double vision?: No  Ears/Nose/Throat Sore throat?: No Sinus problems?: No  Hematologic/Lymphatic Swollen glands?: No Easy bruising?: No  Cardiovascular Leg swelling?: No Chest pain?: No  Respiratory Cough?: No Shortness of breath?: Yes  Endocrine Excessive thirst?: No  Musculoskeletal Back pain?: No Joint pain?: Yes  Neurological Headaches?: No Dizziness?: No  Psychologic Depression?: No Anxiety?: No  Physical Exam: BP (!) 163/95 (BP Location: Left Arm, Patient Position: Sitting, Cuff Size: Large)   Pulse 60   Ht 5\' 10"  (1.778 m)   Wt 228 lb 1.6 oz (103.5 kg)   BMI 32.73 kg/m   Constitutional: Well nourished. Alert and oriented, No acute distress. HEENT: [place] AT, moist mucus membranes. Trachea midline, no masses. Cardiovascular: No clubbing, cyanosis, or edema. Respiratory: Normal respiratory effort, no increased work of breathing. GI: Abdomen is soft, non tender, non distended, no abdominal masses. Liver and spleen not palpable.  No hernias appreciated.  Stool sample for occult testing is not indicated.   GU: No CVA tenderness.  No bladder fullness or masses.  Patient with uncircumcised phallus.   Foreskin easily retracted Urethral meatus is patent.  No penile discharge. No penile lesions or rashes. Scrotum without lesions, cysts, rashes and/or edema.  Testicles are located scrotally bilaterally. No masses are appreciated in the testicles. Left and right epididymis are normal. Rectal: Patient with  normal sphincter tone. Anus and perineum without  scarring or rashes. No rectal masses are appreciated. Prostate is approximately 60 grams, could only palpate apex, no nodules are appreciated. Seminal vesicles are normal. Skin: No rashes, bruises or suspicious lesions. Lymph: No  cervical or inguinal adenopathy. Neurologic: Grossly intact, no focal deficits, moving all 4 extremities. Psychiatric: Normal mood and affect.  Laboratory Data: PSA History  1.1 in 02/2013  0.6 in 11/2013  1.1 in 11/2016 Lab Results  Component Value Date   WBC 9.3 05/22/2017   HGB 13.2 05/22/2017   HCT 39.1 (L) 05/22/2017   MCV 90.3 05/22/2017   PLT 234 05/22/2017    Lab Results  Component Value Date   CREATININE 1.07 06/19/2017    Lab Results  Component Value Date   AST 32 06/19/2017   Lab Results  Component Value Date   ALT 30 06/19/2017    Urinalysis 11-30 RBC's.   See Epic and HPI  I have reviewed the labs.  Pertinent imaging Results for Carlos BustardSNIPES, Marquise (MRN [id number]) as of 10/14/2017 10:32  Ref. Range 10/14/2017 10:22  Scan Result Unknown 166mL      Assessment & Plan:    1. History of hematuria Patient had an occurrence of gross hematuria  I explained to the patient that there are a number of causes that can be associated with blood in the urine, such as stones,  BPH, UTI's, damage to the urinary tract and/or cancer. At this time, I felt that the patient warranted further urologic evaluation with 3 or greater RBC's/hpf on microscopic evaluation of the urine.  The AUA guidelines state that a CT urogram is the preferred imaging study to evaluate hematuria. I explained to the patient that a contrast material will be injected into a vein and that in rare instances, an allergic reaction can result and may even life threatening   The patient denies any allergies to contrast, iodine and is not taking metformin.  He is allergic to shellfish and allergy prep is sent to his pharmacy.  Following the imaging study,  I've recommended a cystoscopy. I described how this is performed, typically in an office setting with a flexible cystoscope. We described the risks, benefits, and possible side effects, the most common of which is a minor amount of blood in the urine and/or  burning which usually resolves in 24 to 48 hours.   The patient had the opportunity to ask questions which were answered. Based upon this discussion, the patient is willing to proceed. Therefore, I've ordered: a CT Urogram and cystoscopy. The patient will return following all of the above for discussion of the results.  UA Urine culture BUN + creatinine      2. BPH with LU TS IPSS score is 19/4, it is improving  Continue conservative management, avoiding bladder irritants and timed voiding's Most bothersome symptoms is/are nocturia, weak urinary stream and incontinence Tamsulosin and Myrbetriq were cost prohibitive; we will send a prescription for alfuzosin 10 mg daily as he states Dr. Achilles Dunkope and had him on this medication previously PSA drawn today  3. Erectile dysfunction SHIM score is 13 Continue sildenafil    Return for CT Urogram report and cystoscopy.  These notes generated with voice recognition software. I apologize for typographical errors.  Michiel CowboySHANNON Damichael Hofman, PA-C  [facility] [address], [address] [place], [place] [id number] [phone]

## 2017-10-14 ENCOUNTER — Other Ambulatory Visit: Payer: Self-pay

## 2017-10-14 ENCOUNTER — Encounter: Payer: Self-pay | Admitting: Student in an Organized Health Care Education/Training Program

## 2017-10-14 ENCOUNTER — Ambulatory Visit
Payer: No Typology Code available for payment source | Attending: Student in an Organized Health Care Education/Training Program | Admitting: Student in an Organized Health Care Education/Training Program

## 2017-10-14 ENCOUNTER — Ambulatory Visit (INDEPENDENT_AMBULATORY_CARE_PROVIDER_SITE_OTHER): Payer: No Typology Code available for payment source | Admitting: Urology

## 2017-10-14 ENCOUNTER — Encounter: Payer: Self-pay | Admitting: Urology

## 2017-10-14 VITALS — BP 163/95 | HR 60 | Ht 70.0 in | Wt 228.1 lb

## 2017-10-14 VITALS — BP 171/93 | HR 58 | Temp 98.0°F | Resp 16 | Ht 69.0 in | Wt 228.0 lb

## 2017-10-14 DIAGNOSIS — M75100 Unspecified rotator cuff tear or rupture of unspecified shoulder, not specified as traumatic: Secondary | ICD-10-CM | POA: Diagnosis not present

## 2017-10-14 DIAGNOSIS — N400 Enlarged prostate without lower urinary tract symptoms: Secondary | ICD-10-CM | POA: Insufficient documentation

## 2017-10-14 DIAGNOSIS — F1190 Opioid use, unspecified, uncomplicated: Secondary | ICD-10-CM | POA: Insufficient documentation

## 2017-10-14 DIAGNOSIS — N138 Other obstructive and reflux uropathy: Secondary | ICD-10-CM | POA: Diagnosis not present

## 2017-10-14 DIAGNOSIS — Z87442 Personal history of urinary calculi: Secondary | ICD-10-CM | POA: Insufficient documentation

## 2017-10-14 DIAGNOSIS — Z79899 Other long term (current) drug therapy: Secondary | ICD-10-CM | POA: Insufficient documentation

## 2017-10-14 DIAGNOSIS — K859 Acute pancreatitis without necrosis or infection, unspecified: Secondary | ICD-10-CM | POA: Insufficient documentation

## 2017-10-14 DIAGNOSIS — E785 Hyperlipidemia, unspecified: Secondary | ICD-10-CM | POA: Diagnosis not present

## 2017-10-14 DIAGNOSIS — L309 Dermatitis, unspecified: Secondary | ICD-10-CM | POA: Diagnosis not present

## 2017-10-14 DIAGNOSIS — F32A Depression, unspecified: Secondary | ICD-10-CM

## 2017-10-14 DIAGNOSIS — S46912A Strain of unspecified muscle, fascia and tendon at shoulder and upper arm level, left arm, initial encounter: Secondary | ICD-10-CM | POA: Diagnosis not present

## 2017-10-14 DIAGNOSIS — M19012 Primary osteoarthritis, left shoulder: Secondary | ICD-10-CM | POA: Diagnosis not present

## 2017-10-14 DIAGNOSIS — Z87448 Personal history of other diseases of urinary system: Secondary | ICD-10-CM | POA: Diagnosis not present

## 2017-10-14 DIAGNOSIS — X58XXXA Exposure to other specified factors, initial encounter: Secondary | ICD-10-CM | POA: Insufficient documentation

## 2017-10-14 DIAGNOSIS — N401 Enlarged prostate with lower urinary tract symptoms: Secondary | ICD-10-CM

## 2017-10-14 DIAGNOSIS — K219 Gastro-esophageal reflux disease without esophagitis: Secondary | ICD-10-CM | POA: Insufficient documentation

## 2017-10-14 DIAGNOSIS — Z79891 Long term (current) use of opiate analgesic: Secondary | ICD-10-CM

## 2017-10-14 DIAGNOSIS — G894 Chronic pain syndrome: Secondary | ICD-10-CM | POA: Diagnosis not present

## 2017-10-14 DIAGNOSIS — K760 Fatty (change of) liver, not elsewhere classified: Secondary | ICD-10-CM | POA: Diagnosis not present

## 2017-10-14 DIAGNOSIS — M75102 Unspecified rotator cuff tear or rupture of left shoulder, not specified as traumatic: Secondary | ICD-10-CM

## 2017-10-14 DIAGNOSIS — F329 Major depressive disorder, single episode, unspecified: Secondary | ICD-10-CM | POA: Diagnosis not present

## 2017-10-14 DIAGNOSIS — N529 Male erectile dysfunction, unspecified: Secondary | ICD-10-CM | POA: Insufficient documentation

## 2017-10-14 DIAGNOSIS — M25512 Pain in left shoulder: Secondary | ICD-10-CM | POA: Insufficient documentation

## 2017-10-14 DIAGNOSIS — F17210 Nicotine dependence, cigarettes, uncomplicated: Secondary | ICD-10-CM | POA: Diagnosis not present

## 2017-10-14 DIAGNOSIS — I10 Essential (primary) hypertension: Secondary | ICD-10-CM | POA: Diagnosis not present

## 2017-10-14 DIAGNOSIS — G8929 Other chronic pain: Secondary | ICD-10-CM

## 2017-10-14 LAB — MICROSCOPIC EXAMINATION
Bacteria, UA: NONE SEEN
EPITHELIAL CELLS (NON RENAL): NONE SEEN /HPF (ref 0–10)
WBC UA: NONE SEEN /HPF (ref 0–5)

## 2017-10-14 LAB — URINALYSIS, COMPLETE
BILIRUBIN UA: NEGATIVE
GLUCOSE, UA: NEGATIVE
KETONES UA: NEGATIVE
LEUKOCYTES UA: NEGATIVE
Nitrite, UA: NEGATIVE
PROTEIN UA: NEGATIVE
SPEC GRAV UA: 1.02 (ref 1.005–1.030)
Urobilinogen, Ur: 0.2 mg/dL (ref 0.2–1.0)
pH, UA: 7 (ref 5.0–7.5)

## 2017-10-14 LAB — BLADDER SCAN AMB NON-IMAGING

## 2017-10-14 MED ORDER — PREDNISONE 50 MG PO TABS: ORAL_TABLET | ORAL | 0 refills | Status: DC

## 2017-10-14 MED ORDER — ALFUZOSIN HCL ER 10 MG PO TB24: 10.0000 mg | ORAL_TABLET | Freq: Every day | ORAL | 3 refills | Status: DC

## 2017-10-14 MED ORDER — DIAZEPAM 10 MG PO TABS: ORAL_TABLET | ORAL | 0 refills | Status: DC

## 2017-10-14 MED ORDER — DIPHENHYDRAMINE HCL 50 MG PO TABS: ORAL_TABLET | ORAL | 0 refills | Status: DC

## 2017-10-14 MED ORDER — HYDROCODONE-ACETAMINOPHEN 7.5-325 MG PO TABS: 1.0000 | ORAL_TABLET | Freq: Two times a day (BID) | ORAL | 0 refills | Status: AC | PRN

## 2017-10-14 MED ORDER — RANITIDINE HCL 150 MG PO TABS: ORAL_TABLET | ORAL | 0 refills | Status: DC

## 2017-10-14 NOTE — Patient Instructions (Signed)
You have been given 3 electronic prescriptions for Hydrocodone to last until 01/12/2018.    

## 2017-10-14 NOTE — Progress Notes (Signed)
Nursing Pain Medication Assessment:  Safety precautions to be maintained throughout the outpatient stay will include: orient to surroundings, keep bed in low position, maintain call bell within reach at all times, provide assistance with transfer out of bed and ambulation.  Medication Inspection Compliance: Pill count conducted under aseptic conditions, in front of the patient. Neither the pills nor the bottle was removed from the patient's sight at any time. Once count was completed pills were immediately returned to the patient in their original bottle.  Medication: Hydrocodone/APAP Pill/Patch Count: 2 of 60 pills remain Pill/Patch Appearance: Markings consistent with prescribed medication Bottle Appearance: Standard pharmacy container. Clearly labeled. Filled Date: 07 / 24 / 2019 Last Medication intake:  Day before yesterday

## 2017-10-14 NOTE — Progress Notes (Signed)
Patient's Name: Carlos Reynolds  MRN: [id number]  Referring Provider: Mikey Reynolds, *  DOB: 05/24/1955  PCP: Carlos College, NP  DOS: 10/14/2017  Note by: Carlos Santa, MD  Service setting: Ambulatory outpatient  Specialty: Interventional Pain Management  Location: [facility] (AMB) Pain Management Facility    Patient type: Established   Primary Reason(s) for Visit: Encounter for prescription drug management. (Level of risk: moderate)  CC: Shoulder Pain (left)  HPI  Carlos Reynolds is a 62 y.o. year old, male patient, who comes today for a medication management evaluation. He has Osteoarthritis of left shoulder; Benign prostatic hyperplasia with incomplete bladder emptying; Erectile dysfunction; Current moderate episode of major depressive disorder without prior episode ([place]); Eczema; Depression; Chronic left shoulder pain; Chronic pain syndrome; Essential hypertension; Gastroesophageal reflux disease; and Acute pancreatitis on their problem list. His primarily concern today is the Shoulder Pain (left)  Pain Assessment: Location: Left Shoulder Radiating: radiates into left hand Onset: More than a month ago Duration: Chronic pain Quality: Aching Severity: 9 /10 (subjective, self-reported pain score)  Note: Reported level is compatible with observation.                         When using our objective Pain Scale, levels between 6 and 10/10 are said to belong in an emergency room, as it progressively worsens from a 6/10, described as severely limiting, requiring emergency care not usually available at an outpatient pain management facility. At a 6/10 level, communication becomes difficult and requires great effort. Assistance to reach the emergency department may be required. Facial flushing and profuse sweating along with potentially dangerous increases in heart rate and blood pressure will be evident. Effect on ADL: "I cant move around good" Timing: Constant Modifying factors: medication,  heat BP: (!) 171/93  HR: (!) 58  Carlos Reynolds was last scheduled for an appointment on 07/15/2017 for medication management. During today's appointment we reviewed Carlos Reynolds's chronic pain status, as well as his outpatient medication regimen.  Patient is still waiting for disability paperwork as well as insurance approval for left shoulder surgery.  Patient is continued to have significant pain in his left shoulder.  He states that it is significantly impacting his activities of daily living.  Is requesting to increase his medication to 3 times a day.  The patient  reports that he does not use drugs. His body mass index is 33.67 kg/m.  Further details on both, my assessment(s), as well as the proposed treatment plan, please see below.  Controlled Substance Pharmacotherapy Assessment REMS (Risk Evaluation and Mitigation Strategy)  Analgesic: Hydrocodone 7.5 mg twice daily as needed, quantity 60/month MME/day: 15 mg/day.  Carlos Shorter, RN  10/14/2017 10:28 AM  Signed Nursing Pain Medication Assessment:  Safety precautions to be maintained throughout the outpatient stay will include: orient to surroundings, keep bed in low position, maintain call bell within reach at all times, provide assistance with transfer out of bed and ambulation.  Medication Inspection Compliance: Pill count conducted under aseptic conditions, in front of the patient. Neither the pills nor the bottle was removed from the patient's sight at any time. Once count was completed pills were immediately returned to the patient in their original bottle.  Medication: Hydrocodone/APAP Pill/Patch Count: 2 of 60 pills remain Pill/Patch Appearance: Markings consistent with prescribed medication Bottle Appearance: Standard pharmacy container. Clearly labeled. Filled Date: 07 / 24 / 2019 Last Medication intake:  Day before yesterday   Pharmacokinetics: Liberation  and absorption (onset of action): WNL Distribution (time to peak  effect): WNL Metabolism and excretion (duration of action): WNL         Pharmacodynamics: Desired effects: Analgesia: Carlos Reynolds reports 50% benefit. Functional ability: Patient reports that medication allows him to accomplish basic ADLs Clinically meaningful improvement in function (CMIF): Sustained CMIF goals met Perceived effectiveness: Described as relatively effective but with some room for improvement Undesirable effects: Side-effects or Adverse reactions: None reported Monitoring:  PMP: Online review of the past 47-monthperiod conducted. Compliant with practice rules and regulations Last UDS on record: Summary  Date Value Ref Range Status  01/21/2017 FINAL  Final    Comment:    ==================================================================== [facility]RE COMP DRUG ANALYSIS,UR ==================================================================== Test                             Result       Flag       Units Drug Present and Declared for Prescription Verification   Fluoxetine                     PRESENT      EXPECTED   Norfluoxetine                  PRESENT      EXPECTED    Norfluoxetine is an expected metabolite of fluoxetine. Drug Absent but Declared for Prescription Verification   Gabapentin                     Not Detected UNEXPECTED   Diclofenac                     Not Detected UNEXPECTED    Diclofenac, as indicated in the declared medication list, is not    always detected even when used as directed. ==================================================================== Test                      Result    Flag   Units      Ref Range   Creatinine              57               mg/dL      >=20 ==================================================================== Declared Medications:  The flagging and interpretation on this report are based on the  following declared medications.  Unexpected results may arise from  inaccuracies in the declared medications.  **Note: The  testing scope of this panel includes these medications:  Fluoxetine  Gabapentin  **Note: The testing scope of this panel does not include small to  moderate amounts of these reported medications:  Diclofenac  **Note: The testing scope of this panel does not include following  reported medications:  Hydrochlorothiazide  Omeprazole  Sildenafil  Tamsulosin ==================================================================== For clinical consultation, please call ([phone] ====================================================================    UDS interpretation: Compliant          Medication Assessment Form: Reviewed. Patient indicates being compliant with therapy Treatment compliance: Compliant Risk Assessment Profile: Aberrant behavior: See prior evaluations. None observed or detected today Comorbid factors increasing risk of overdose: See prior notes. No additional risks detected today Opioid risk tool (ORT) (Total Score): 1 Personal History of Substance Abuse (SUD-Substance use disorder):  Alcohol: Negative  Illegal Drugs: Negative  Rx Drugs: Negative  ORT Risk Level calculation: Low Risk Risk of substance use disorder (SUD): Low Opioid Risk Tool -  10/14/17 1026      Family History of Substance Abuse   Alcohol  Negative    Illegal Drugs  Negative    Rx Drugs  Negative      Personal History of Substance Abuse   Alcohol  Negative    Illegal Drugs  Negative    Rx Drugs  Negative      Age   Age between 28-45 years   No      History of Preadolescent Sexual Abuse   History of Preadolescent Sexual Abuse  Negative or Male      Psychological Disease   Psychological Disease  Negative    Depression  Positive      Total Score   Opioid Risk Tool Scoring  1    Opioid Risk Interpretation  Low Risk      ORT Scoring interpretation table:  Score <3 = Low Risk for SUD  Score between 4-7 = Moderate Risk for SUD  Score >8 = High Risk for Opioid Abuse   Risk Mitigation  Strategies:  Patient Counseling: Covered Patient-Prescriber Agreement (PPA): Present and active  Notification to other healthcare providers: Done  Pharmacologic Plan: Increase hydrocodone from 7.5 mill grams twice daily as needed to 3 times daily as needed quantity increased from 60 to 75/month             Laboratory Chemistry  Inflammation Markers (CRP: Acute Phase) (ESR: Chronic Phase) No results found for: CRP, ESRSEDRATE, LATICACIDVEN                       Rheumatology Markers No results found for: RF, ANA, LABURIC, URICUR, LYMEIGGIGMAB, LYMEABIGMQN, HLAB27                      Renal Function Markers Lab Results  Component Value Date   BUN 20 06/19/2017   CREATININE 1.07 68/12/5724   BCR NOT APPLICABLE 20/35/5974   GFRAA 86 06/19/2017   GFRNONAA 75 06/19/2017                             Hepatic Function Markers Lab Results  Component Value Date   AST 32 06/19/2017   ALT 30 06/19/2017   ALBUMIN 3.0 (L) 05/23/2017   ALKPHOS 64 05/23/2017   LIPASE 28 05/23/2017                        Electrolytes Lab Results  Component Value Date   NA 140 06/19/2017   K 3.9 06/19/2017   CL 107 06/19/2017   CALCIUM 9.1 06/19/2017                        Neuropathy Markers Lab Results  Component Value Date   HGBA1C 5.5 06/19/2017   HIV Non Reactive 05/21/2017                        Bone Pathology Markers Lab Results  Component Value Date   TESTOSTERONE 439 12/04/2016                         Coagulation Parameters Lab Results  Component Value Date   PLT 234 05/22/2017                        Cardiovascular Markers Lab Results  Component Value Date   HGB 13.2 05/22/2017   HCT 39.1 (L) 05/22/2017                         CA Markers No results found for: CEA, CA125, LABCA2                      Note: Lab results reviewed.  Recent Diagnostic Imaging Results  MR 3D Recon At Scanner CLINICAL DATA:  Left upper quadrant pain and nausea. Pancreatitis. Elevated  lipase.  EXAM: MRI ABDOMEN WITHOUT AND WITH CONTRAST (INCLUDING MRCP)  TECHNIQUE: Multiplanar multisequence MR imaging of the abdomen was performed both before and after the administration of intravenous contrast. Heavily T2-weighted images of the biliary and pancreatic ducts were obtained, and three-dimensional MRCP images were rendered by post processing.  CONTRAST:  37m MULTIHANCE GADOBENATE DIMEGLUMINE 529 MG/ML IV SOLN  COMPARISON:  CT on 05/20/2017  FINDINGS: Image degradation by motion artifact noted.  Lower chest: No acute findings.  Hepatobiliary: No hepatic masses identified. Severe hepatic steatosis is seen. Gallbladder is unremarkable. No evidence of biliary ductal dilatation or choledocholithiasis.  Pancreas: Swelling is seen involving the pancreatic tail, with mild peripancreatic edema. No evidence of pancreatic necrosis or mass. No evidence of peripancreatic fluid collections. No evidence of pancreatic ductal dilatation or pancreas divisum.  Spleen:  Within normal limits in size and appearance.  Adrenals/Urinary Tract: No masses identified. Tiny sub-cm cyst in upper pole of left kidney. No evidence of hydronephrosis.  Stomach/Bowel: Visualized portion unremarkable.  Vascular/Lymphatic: No pathologically enlarged lymph nodes identified. No abdominal aortic aneurysm.  Other:  None.  Musculoskeletal:  No suspicious bone lesions identified.  IMPRESSION: Mild acute pancreatitis involving the pancreatic tail. No evidence of peripancreatic fluid collection or other complication.  Severe hepatic steatosis.  Electronically Signed   By: JEarle GellM.D.   On: 05/23/2017 09:20 MR ABDOMEN MRCP W WO CONTAST CLINICAL DATA:  Left upper quadrant pain and nausea. Pancreatitis. Elevated lipase.  EXAM: MRI ABDOMEN WITHOUT AND WITH CONTRAST (INCLUDING MRCP)  TECHNIQUE: Multiplanar multisequence MR imaging of the abdomen was performed both before and after the  administration of intravenous contrast. Heavily T2-weighted images of the biliary and pancreatic ducts were obtained, and three-dimensional MRCP images were rendered by post processing.  CONTRAST:  261mMULTIHANCE GADOBENATE DIMEGLUMINE 529 MG/ML IV SOLN  COMPARISON:  CT on 05/20/2017  FINDINGS: Image degradation by motion artifact noted.  Lower chest: No acute findings.  Hepatobiliary: No hepatic masses identified. Severe hepatic steatosis is seen. Gallbladder is unremarkable. No evidence of biliary ductal dilatation or choledocholithiasis.  Pancreas: Swelling is seen involving the pancreatic tail, with mild peripancreatic edema. No evidence of pancreatic necrosis or mass. No evidence of peripancreatic fluid collections. No evidence of pancreatic ductal dilatation or pancreas divisum.  Spleen:  Within normal limits in size and appearance.  Adrenals/Urinary Tract: No masses identified. Tiny sub-cm cyst in upper pole of left kidney. No evidence of hydronephrosis.  Stomach/Bowel: Visualized portion unremarkable.  Vascular/Lymphatic: No pathologically enlarged lymph nodes identified. No abdominal aortic aneurysm.  Other:  None.  Musculoskeletal:  No suspicious bone lesions identified.  IMPRESSION: Mild acute pancreatitis involving the pancreatic tail. No evidence of peripancreatic fluid collection or other complication.  Severe hepatic steatosis.  Electronically Signed   By: JoEarle Gell.D.   On: 05/23/2017 09:20  Complexity Note: Imaging results reviewed. Results shared with Mr. SnBreakerusing Layman's terms.  Meds   Current Outpatient Medications:  .  alfuzosin (UROXATRAL) 10 MG 24 hr tablet, Take 1 tablet (10 mg total) by mouth daily with breakfast., Disp: 90 tablet, Rfl: 3 .  diazepam (VALIUM) 10 MG tablet, Take one tablet 30 minutes prior to cysto, Disp: 1 tablet, Rfl: 0 .  diclofenac (VOLTAREN) 75 MG EC tablet, Take by mouth., Disp: ,  Rfl:  .  diphenhydrAMINE (BENADRYL) 50 MG tablet, Take one hour prior to the CT Urogram, Disp: 1 tablet, Rfl: 0 .  FLUoxetine (PROZAC) 20 MG capsule, Take 1 capsule (20 mg total) by mouth daily., Disp: 90 capsule, Rfl: 1 .  gabapentin (NEURONTIN) 300 MG capsule, Take 1 capsule (300 mg total) by mouth 2 (two) times daily., Disp: 60 capsule, Rfl: 2 .  hydrALAZINE (APRESOLINE) 25 MG tablet, Take 1 tablet (25 mg total) by mouth every 8 (eight) hours., Disp: 90 tablet, Rfl: 1 .  hydrALAZINE (APRESOLINE) 25 MG tablet, Take 25 mg by mouth every 8 (eight) hours., Disp: , Rfl: 1 .  [START ON 10/15/2017] HYDROcodone-acetaminophen (NORCO) 7.5-325 MG tablet, Take 1-2 tablets by mouth 2 (two) times daily as needed for moderate pain., Disp: 75 tablet, Rfl: 0 .  omeprazole (PRILOSEC) 20 MG capsule, TAKE 1 CAPSULE BY MOUTH ONCE DAILY, Disp: , Rfl: 1 .  [START ON 11/14/2017] HYDROcodone-acetaminophen (NORCO) 7.5-325 MG tablet, Take 1-2 tablets by mouth 2 (two) times daily as needed for moderate pain., Disp: 75 tablet, Rfl: 0 .  [START ON 12/13/2017] HYDROcodone-acetaminophen (NORCO) 7.5-325 MG tablet, Take 1-2 tablets by mouth 2 (two) times daily as needed for moderate pain., Disp: 75 tablet, Rfl: 0 .  ranitidine (ZANTAC) 150 MG tablet, Take one tablet 1 hour prior to CT Urogram (Patient not taking: Reported on 10/14/2017), Disp: 1 tablet, Rfl: 0  ROS  Constitutional: Denies any fever or chills Gastrointestinal: No reported hemesis, hematochezia, vomiting, or acute GI distress Musculoskeletal: Denies any acute onset joint swelling, redness, loss of ROM, or weakness Neurological: No reported episodes of acute onset apraxia, aphasia, dysarthria, agnosia, amnesia, paralysis, loss of coordination, or loss of consciousness  Allergies  Mr. Parlin is allergic to shellfish allergy; tape; amlodipine; and lisinopril.  PFSH  Drug: Mr. Cappella  reports that he does not use drugs. Alcohol:  reports that he drinks about 1.0  standard drinks of alcohol per week. Tobacco:  reports that he has been smoking cigarettes. He has a 23.00 pack-year smoking history. He has never used smokeless tobacco. Medical:  has a past medical history of Anxiety, Arthritis, Depression, Heartburn, Hematuria, HLD (hyperlipidemia), Hypertension, Kidney stone, and Pancreatitis (05/20/2017). Surgical: Mr. Riano  has a past surgical history that includes No past surgeries; Colonoscopy; and Colonoscopy with propofol (N/A, 04/15/2017). Family: family history includes Colon cancer in his brother and mother; Diabetes in his father; Heart disease in his brother and mother; Prostate cancer in his father.  Constitutional Exam  General appearance: Well nourished, well developed, and well hydrated. In no apparent acute distress Vitals:   10/14/17 1017  BP: (!) 171/93  Pulse: (!) 58  Resp: 16  Temp: 98 F (36.7 C)  SpO2: 100%  Weight: 228 lb (103.4 kg)  Height: 5' 9"  (1.753 m)   BMI Assessment: Estimated body mass index is 33.67 kg/m as calculated from the following:   Height as of this encounter: 5' 9"  (1.753 m).   Weight as of this encounter: 228 lb (103.4 kg).  BMI interpretation table: BMI level Category Range association with higher incidence  of chronic pain  <18 kg/m2 Underweight   18.5-24.9 kg/m2 Ideal body weight   25-29.9 kg/m2 Overweight Increased incidence by 20%  30-34.9 kg/m2 Obese (Class I) Increased incidence by 68%  35-39.9 kg/m2 Severe obesity (Class II) Increased incidence by 136%  >40 kg/m2 Extreme obesity (Class III) Increased incidence by 254%   Patient's current BMI Ideal Body weight  Body mass index is 33.67 kg/m. Ideal body weight: 70.7 kg (155 lb 13.8 oz) Adjusted ideal body weight: 83.8 kg (184 lb 11.5 oz)   BMI Readings from Last 4 Encounters:  10/14/17 33.67 kg/m  10/14/17 32.73 kg/m  07/29/17 32.34 kg/m  07/15/17 32.00 kg/m   Wt Readings from Last 4 Encounters:  10/14/17 228 lb (103.4 kg)   10/14/17 228 lb 1.6 oz (103.5 kg)  07/29/17 225 lb 6.4 oz (102.2 kg)  07/15/17 223 lb (101.2 kg)  Psych/Mental status: Alert, oriented x 3 (person, place, & time)       Eyes: PERLA Respiratory: No evidence of acute respiratory distress  Cervical Spine Area Exam  Skin & Axial Inspection: No masses, redness, edema, swelling, or associated skin lesions Alignment: Symmetrical Functional ROM: Unrestricted ROM      Stability: No instability detected Muscle Tone/Strength: Functionally intact. No obvious neuro-muscular anomalies detected. Sensory (Neurological): Unimpaired Palpation: No palpable anomalies              Upper Extremity (UE) Exam    Side: Right upper extremity  Side: Left upper extremity   Skin & Extremity Inspection: Skin color, temperature, and hair growth are WNL. No peripheral edema or cyanosis. No masses, redness, swelling, asymmetry, or associated skin lesions. No contractures.  Skin & Extremity Inspection: Atrophy   Functional ROM: Unrestricted ROM          Functional ROM: Decreased ROM           Muscle Tone/Strength: Functionally intact. No obvious neuro-muscular anomalies detected.  Muscle Tone/Strength: Functionally intact. No obvious neuro-muscular anomalies detected.   Sensory (Neurological): Unimpaired          Sensory (Neurological): Arthropathic arthralgia           Palpation: No palpable anomalies              Palpation: Complains of area being tender to palpation               Provocative Test(s):  Phalen's test: deferred Tinel's test: deferred Apley's scratch test (touch opposite shoulder):  Action 1 (Across chest): deferred Action 2 (Overhead): deferred Action 3 (LB reach): deferred   Provocative Test(s):  Phalen's test: deferred Tinel's test: deferred Apley's scratch test (touch opposite shoulder):  Action 1 (Across chest): Decreased ROM Action 2 (Overhead): Decreased ROM Action 3 (LB reach): Decreased ROM was     Thoracic Spine Area Exam   Skin & Axial Inspection: No masses, redness, or swelling Alignment: Symmetrical Functional ROM: Unrestricted ROM Stability: No instability detected Muscle Tone/Strength: Functionally intact. No obvious neuro-muscular anomalies detected. Sensory (Neurological): Unimpaired Muscle strength & Tone: No palpable anomalies  Lumbar Spine Area Exam  Skin & Axial Inspection: No masses, redness, or swelling Alignment: Symmetrical Functional ROM: Unrestricted ROM       Stability: No instability detected Muscle Tone/Strength: Functionally intact. No obvious neuro-muscular anomalies detected. Sensory (Neurological): Unimpaired Palpation: No palpable anomalies       Provocative Tests: Hyperextension/rotation test: deferred today       Lumbar quadrant test (Kemp's test): deferred today       Lateral bending  test: deferred today       Patrick's Maneuver: deferred today                   FABER test: deferred today                   S-I anterior distraction/compression test: deferred today         S-I lateral compression test: deferred today         S-I Thigh-thrust test: deferred today         S-I Gaenslen's test: deferred today          Gait & Posture Assessment  Ambulation: Unassisted Gait: Relatively normal for age and body habitus Posture: WNL   Lower Extremity Exam    Side: Right lower extremity  Side: Left lower extremity  Stability: No instability observed          Stability: No instability observed          Skin & Extremity Inspection: Skin color, temperature, and hair growth are WNL. No peripheral edema or cyanosis. No masses, redness, swelling, asymmetry, or associated skin lesions. No contractures.  Skin & Extremity Inspection: Skin color, temperature, and hair growth are WNL. No peripheral edema or cyanosis. No masses, redness, swelling, asymmetry, or associated skin lesions. No contractures.  Functional ROM: Unrestricted ROM                  Functional ROM: Unrestricted ROM                   Muscle Tone/Strength: Functionally intact. No obvious neuro-muscular anomalies detected.  Muscle Tone/Strength: Functionally intact. No obvious neuro-muscular anomalies detected.  Sensory (Neurological): Unimpaired  Sensory (Neurological): Unimpaired  Palpation: No palpable anomalies  Palpation: No palpable anomalies   Assessment  Primary Diagnosis & Pertinent Problem List: The primary encounter diagnosis was Tear of left supraspinatus tendon. Diagnoses of Chronic left shoulder pain, Primary osteoarthritis of left shoulder, Depression, unspecified depression type, Chronic pain syndrome, and Chronic prescription opiate use were also pertinent to this visit.  Status Diagnosis  Persistent Persistent Persistent 1. Tear of left supraspinatus tendon   2. Chronic left shoulder pain   3. Primary osteoarthritis of left shoulder   4. Depression, unspecified depression type   5. Chronic pain syndrome   6. Chronic prescription opiate use     General Recommendations: The pain condition that the patient suffers from is best treated with a multidisciplinary approach that involves an increase in physical activity to prevent de-conditioning and worsening of the pain cycle, as well as psychological counseling (formal and/or informal) to address the co-morbid psychological affects of pain. Treatment will often involve judicious use of pain medications and interventional procedures to decrease the pain, allowing the patient to participate in the physical activity that will ultimately produce long-lasting pain reductions. The goal of the multidisciplinary approach is to return the patient to a higher level of overall function and to restore their ability to perform activities of daily living.   62 year old male with a history of left shoulder pain secondary to left shoulder osteoarthritis, tear of left supraspinatus tendon that was seen on his most recent left shoulder MRI. Patient has significant  immobility of his left shoulder as well as severe pain which results in significant disability.   He is waiting to have surgery on his left shoulder but given his financial situation at the time, is unable to afford surgery.  Given that the patient is having  worsening pain is impacting his activities of daily living, will increase his hydrocodone from 7.5 mg twice daily quantity 60 to 3 times daily as needed, quantity 75/month.  [place] PMP checked and appropriate.  UDS up-to-date.  Patient instructed to continue gabapentin 300 mg twice daily.  Does not need refill on gabapentin.  Plan of Care  Pharmacotherapy (Medications Ordered): Meds ordered this encounter  Medications  . HYDROcodone-acetaminophen (NORCO) 7.5-325 MG tablet    Sig: Take 1-2 tablets by mouth 2 (two) times daily as needed for moderate pain.    Dispense:  75 tablet    Refill:  0  . HYDROcodone-acetaminophen (NORCO) 7.5-325 MG tablet    Sig: Take 1-2 tablets by mouth 2 (two) times daily as needed for moderate pain.    Dispense:  75 tablet    Refill:  0    Do not place this medication, or any other prescription from our practice, on "Automatic Refill". Patient may have prescription filled one day early if pharmacy is closed on scheduled refill date.  Marland Kitchen HYDROcodone-acetaminophen (NORCO) 7.5-325 MG tablet    Sig: Take 1-2 tablets by mouth 2 (two) times daily as needed for moderate pain.    Dispense:  75 tablet    Refill:  0    Do not place this medication, or any other prescription from our practice, on "Automatic Refill". Patient may have prescription filled one day early if pharmacy is closed on scheduled refill date.   Time Note: Greater than 50% of the 25 minute(s) of face-to-face time spent with Mr. Aird, was spent in counseling/coordination of care regarding: Mr. Whedbee primary cause of pain, the appropriate use of his medications, realistic expectations, the medication agreement and the patient's responsibilities when it comes  to controlled substances.  Provider-requested follow-up: Return in about 3 months (around 01/14/2018) for Medication Management.  Future Appointments  Date Time Provider [place]  11/11/2017 10:30 AM Billey Co, MD [facility] None  11/20/2017 10:30 AM [facility] None  11/27/2017 10:00 AM McGowan, Hunt Oris, PA-C [facility] None  01/13/2018 10:30 AM Carlos Santa, MD [facility] None  02/10/2018  9:00 AM Virgel Manifold, MD [facility] None    Primary Care Physician: Carlos College, NP Location: [facility] Note by: Carlos Reynolds, M.D Date: 10/14/2017; Time: 3:19 PM  Patient Instructions  You have been given 3 electronic prescriptions for Hydrocodone to last until 01/12/2018.

## 2017-10-15 LAB — PSA: PROSTATE SPECIFIC AG, SERUM: 1.1 ng/mL (ref 0.0–4.0)

## 2017-10-15 LAB — BUN+CREAT
BUN / CREAT RATIO: 11 (ref 10–24)
BUN: 13 mg/dL (ref 8–27)
CREATININE: 1.22 mg/dL (ref 0.76–1.27)
GFR, EST AFRICAN AMERICAN: 74 mL/min/{1.73_m2} (ref >59)
GFR, EST NON AFRICAN AMERICAN: 64 mL/min/{1.73_m2} (ref >59)

## 2017-10-17 LAB — CULTURE, URINE COMPREHENSIVE

## 2017-11-02 ENCOUNTER — Other Ambulatory Visit: Payer: Self-pay | Admitting: Student in an Organized Health Care Education/Training Program

## 2017-11-07 ENCOUNTER — Ambulatory Visit
Admission: RE | Admit: 2017-11-07 | Discharge: 2017-11-07 | Disposition: A | Discharge status: Home / Self Care | Payer: No Typology Code available for payment source | Source: Ambulatory Visit | Attending: Urology | Admitting: Urology

## 2017-11-07 DIAGNOSIS — Z87448 Personal history of other diseases of urinary system: Secondary | ICD-10-CM | POA: Insufficient documentation

## 2017-11-07 DIAGNOSIS — K760 Fatty (change of) liver, not elsewhere classified: Secondary | ICD-10-CM | POA: Insufficient documentation

## 2017-11-07 MED ORDER — IOPAMIDOL (ISOVUE-300) INJECTION 61%
150.0000 mL | Freq: Once | INTRAVENOUS | Status: AC | PRN
  Administered 2017-11-07: 150 mL via INTRAVENOUS

## 2017-11-11 ENCOUNTER — Other Ambulatory Visit: Payer: Self-pay

## 2017-11-11 ENCOUNTER — Ambulatory Visit (INDEPENDENT_AMBULATORY_CARE_PROVIDER_SITE_OTHER): Payer: No Typology Code available for payment source | Admitting: Urology

## 2017-11-11 ENCOUNTER — Encounter: Payer: Self-pay | Admitting: Urology

## 2017-11-11 VITALS — BP 160/90 | HR 66 | Ht 70.0 in | Wt 228.0 lb

## 2017-11-11 DIAGNOSIS — Z87448 Personal history of other diseases of urinary system: Secondary | ICD-10-CM

## 2017-11-11 LAB — URINALYSIS, COMPLETE
BILIRUBIN UA: NEGATIVE
GLUCOSE, UA: NEGATIVE
KETONES UA: NEGATIVE
NITRITE UA: NEGATIVE
PROTEIN UA: NEGATIVE
Specific Gravity, UA: 1.025 (ref 1.005–1.030)
UUROB: 0.2 mg/dL (ref 0.2–1.0)
pH, UA: 6 (ref 5.0–7.5)

## 2017-11-11 LAB — MICROSCOPIC EXAMINATION: RBC MICROSCOPIC, UA: NONE SEEN /HPF (ref 0–2)

## 2017-11-11 NOTE — Progress Notes (Signed)
Cystoscopy Procedure Note:  Indication: Gross hematuria  After informed consent and discussion of the procedure and its risks, Carlos Reynolds was positioned and prepped in the standard fashion. Cystoscopy was attempted with a flexible cystoscope. The urethra was notable for subtle narrowing of the bulbar urethra, scope passed easily. Despite multiple attempts I was unable to pass the cystoscope into the bladder secondary to the patients external sphincter being clamped down. Numerous strategies tried to encourage relaxation and passage of the scope, however unsuccessful.  Imaging: CTU reviewed, no urolithiasis or concerning findings  Findings: Unable to pass cystoscope secondary to patient clamping external sphincter  Assessment and Plan: Cytology sent from voided urine specimen Offered cystoscopy in OR to rule out bladder pathology, patient declined If cytology positive, patient is willing to undergo cystoscopy/possible biopsy/possible TURBT in OR  Sondra ComeBrian C Taraya Steward, MD 11/11/2017

## 2017-11-11 NOTE — Progress Notes (Signed)
urin

## 2017-11-14 ENCOUNTER — Other Ambulatory Visit: Payer: Self-pay | Admitting: Urology

## 2017-11-17 ENCOUNTER — Telehealth: Payer: Self-pay

## 2017-11-17 NOTE — Progress Notes (Signed)
Pt informed

## 2017-11-17 NOTE — Telephone Encounter (Signed)
Called pt no answer. Unable to leave message as no voicemail is set up. Sent pt message via mychart.

## 2017-11-17 NOTE — Telephone Encounter (Signed)
-----   Message from Sondra ComeBrian C Sninsky, MD sent at 11/17/2017  1:25 PM EDT ----- Regarding: negative cytology Please let him know his cytology is negative, he can follow up as needed  Legrand RamsBrian Sninsky, MD 11/17/2017    ----- Message ----- From: Marlaine HindGentry, Angela D Sent: 11/14/2017   2:46 PM EDT To: Sondra ComeBrian C Sninsky, MD

## 2017-11-20 ENCOUNTER — Other Ambulatory Visit: Payer: No Typology Code available for payment source

## 2017-11-27 ENCOUNTER — Ambulatory Visit (INDEPENDENT_AMBULATORY_CARE_PROVIDER_SITE_OTHER): Payer: No Typology Code available for payment source | Admitting: Urology

## 2017-11-27 ENCOUNTER — Encounter: Payer: Self-pay | Admitting: Urology

## 2017-11-27 VITALS — BP 136/100 | Ht 69.0 in | Wt 220.5 lb

## 2017-11-27 DIAGNOSIS — N401 Enlarged prostate with lower urinary tract symptoms: Secondary | ICD-10-CM

## 2017-11-27 DIAGNOSIS — R351 Nocturia: Secondary | ICD-10-CM

## 2017-11-27 DIAGNOSIS — N138 Other obstructive and reflux uropathy: Secondary | ICD-10-CM

## 2017-11-27 DIAGNOSIS — Z87448 Personal history of other diseases of urinary system: Secondary | ICD-10-CM

## 2017-11-27 DIAGNOSIS — N529 Male erectile dysfunction, unspecified: Secondary | ICD-10-CM

## 2017-11-27 LAB — BLADDER SCAN AMB NON-IMAGING: Scan Result: 35

## 2017-11-27 NOTE — Progress Notes (Signed)
11/27/2017 10:33 AM   Carlos Reynolds [birth year] [id number]  Referring provider: Galen Manila, NP [address] [place], [place] [address]  Chief Complaint  Patient presents with  . Follow-up    HPI: Patient is a 62 year old African-American male with BPH and LUT S, erectile dysfunction, chronic pelvic pain and a history of hematuria who presents today for follow up.    History of hematuria He is a smoker.  CTU in 10/2017 noted no nephroureterolithiasis. No hydronephrosis. No suspicious enhancing renal mass. No filling defects identified within the opacified renal collecting systems and ureters on delayed images.  Hepatic steatosis.  Cystoscopy on 11/11/2017 was not completed as patient would not allow the scope to pass.  Urine cytology revealed dysmorphic erthrocytes.     Chronic pelvic pain Currently being seen at the pain clinic.    BPH WITH LUTS  (prostate and/or bladder) IPSS score: 25/5   PVR: 35 mL  Previous score: 22/5   Previous PVR: 166 mL  Major complaint(s): nocturia x 3 for several months   Denies any dysuria, hematuria or suprapubic pain.   Currently taking: Nothing.  Tamsulosin and Myrbetriq were cost prohibitive.    Denies any recent fevers, chills, nausea or vomiting.  His father was diagnosed with fatal prostate cancer at age 56.    IPSS    Row Name 11/27/17 1000         International Prostate Symptom Score   How often have you had the sensation of not emptying your bladder?  Almost always     How often have you had to urinate less than every two hours?  More than half the time     How often have you found you stopped and started again several times when you urinated?  More than half the time     How often have you found it difficult to postpone urination?  Almost always     How often have you had a weak urinary stream?  More than half the time     How often have you had to strain to start urination?  Not at All     How many times did you  typically get up at night to urinate?  3 Times     Total IPSS Score  25       Quality of Life due to urinary symptoms   If you were to spend the rest of your life with your urinary condition just the way it is now how would you feel about that?  Unhappy        Score:  1-7 Mild 8-19 Moderate 20-35 Severe  Erectile dysfunction SHIM score was 15, it mild to moderate ED.  His previous SHIM score was 13.  He has been having difficulty with erections for over a year.  His major complaint is erections not firm enough.  His libido is preserved.   His risk factors for ED are age, BPH, HTN, alcohol abuse, smoking and pain medication.  He denies any painful erections or curvatures with his erections.   He is still having spontaneous erections.  He has tried three sildenafil and they were not effective.    SHIM    Row Name 11/27/17 1017         SHIM: Over the last 6 months:   How do you rate your confidence that you could get and keep an erection?  Low     When you had erections with sexual  stimulation, how often were your erections hard enough for penetration (entering your partner)?  A Few Times (much less than half the time)     During sexual intercourse, how often were you able to maintain your erection after you had penetrated (entered) your partner?  Sometimes (about half the time)     During sexual intercourse, how difficult was it to maintain your erection to completion of intercourse?  Slightly Difficult     When you attempted sexual intercourse, how often was it satisfactory for you?  Most Times (much more than half the time)       SHIM Total Score   SHIM  15        Score: 1-7 Severe ED 8-11 Moderate ED 12-16 Mild-Moderate ED 17-21 Mild ED 22-25 No ED   PMH: Past Medical History:  Diagnosis Date  . Anxiety   . Arthritis   . Depression   . Heartburn   . Hematuria   . HLD (hyperlipidemia)   . Hypertension   . Kidney stone   . Pancreatitis 05/20/2017    Surgical  History: Past Surgical History:  Procedure Laterality Date  . COLONOSCOPY    . COLONOSCOPY WITH PROPOFOL N/A 04/15/2017   Procedure: COLONOSCOPY WITH PROPOFOL;  Surgeon: Wyline Mood, MD;  Location: [facility] ENDOSCOPY;  Service: Gastroenterology;  Laterality: N/A;  . NO PAST SURGERIES     verified with patient on 01/15/17    Home Medications:  Allergies as of 11/27/2017      Reactions   Shellfish Allergy Hives   Amlodipine Other (See Comments)   Possible hand swelling   Lisinopril Other (See Comments)   Cough   Tape Rash   Brown, elastic tape used by physical therapist; skin breaks out      Medication List        Accurate as of 11/27/17 10:33 AM. Always use your most recent med list.          alfuzosin 10 MG 24 hr tablet Commonly known as:  UROXATRAL Take 1 tablet (10 mg total) by mouth daily with breakfast.   diazepam 10 MG tablet Commonly known as:  VALIUM Take one tablet 30 minutes prior to cysto   diclofenac 75 MG EC tablet Commonly known as:  VOLTAREN Take by mouth.   diphenhydrAMINE 50 MG tablet Commonly known as:  BENADRYL Take one hour prior to the CT Urogram   FLUoxetine 20 MG capsule Commonly known as:  PROZAC Take 1 capsule (20 mg total) by mouth daily.   gabapentin 300 MG capsule Commonly known as:  NEURONTIN Take 1 capsule (300 mg total) by mouth 2 (two) times daily.   hydrALAZINE 25 MG tablet Commonly known as:  APRESOLINE Take 1 tablet (25 mg total) by mouth every 8 (eight) hours.   hydrALAZINE 25 MG tablet Commonly known as:  APRESOLINE Take 25 mg by mouth every 8 (eight) hours.   HYDROcodone-acetaminophen 7.5-325 MG tablet Commonly known as:  NORCO Take 1-2 tablets by mouth 2 (two) times daily as needed for moderate pain.   HYDROcodone-acetaminophen 7.5-325 MG tablet Commonly known as:  NORCO Take 1-2 tablets by mouth 2 (two) times daily as needed for moderate pain. Start taking on:  12/13/2017   omeprazole 20 MG capsule Commonly known  as:  PRILOSEC TAKE 1 CAPSULE BY MOUTH ONCE DAILY   ranitidine 150 MG tablet Commonly known as:  ZANTAC Take one tablet 1 hour prior to CT Urogram       Allergies:  Allergies  Allergen Reactions  . Shellfish Allergy Hives  . Amlodipine Other (See Comments)    Possible hand swelling  . Lisinopril Other (See Comments)    Cough  . Tape Rash    Brown, elastic tape used by physical therapist; skin breaks out    Family History: Family History  Problem Relation Age of Onset  . Heart disease Mother   . Colon cancer Mother   . Diabetes Father   . Prostate cancer Father   . Colon cancer Brother   . Heart disease Brother   . Kidney cancer Neg Hx   . Bladder Cancer Neg Hx     Social History:  reports that he has been smoking cigarettes. He has a 23.00 pack-year smoking history. He has never used smokeless tobacco. He reports that he drinks about 2.0 standard drinks of alcohol per week. He reports that he does not use drugs.  ROS: UROLOGY Frequent Urination?: No Hard to postpone urination?: No Burning/pain with urination?: No Get up at night to urinate?: No Leakage of urine?: No Urine stream starts and stops?: No Trouble starting stream?: No Do you have to strain to urinate?: No Blood in urine?: No Urinary tract infection?: No Sexually transmitted disease?: No Injury to kidneys or bladder?: No Painful intercourse?: No Weak stream?: No Erection problems?: No Penile pain?: No  Gastrointestinal Nausea?: No Vomiting?: No Indigestion/heartburn?: No Diarrhea?: No Constipation?: No  Constitutional Fever: No Night sweats?: No Weight loss?: No Fatigue?: No  Skin Skin rash/lesions?: No Itching?: No  Eyes Blurred vision?: No Double vision?: No  Ears/Nose/Throat Sore throat?: No Sinus problems?: No  Hematologic/Lymphatic Swollen glands?: No Easy bruising?: No  Cardiovascular Leg swelling?: No Chest pain?: No  Respiratory Cough?: No Shortness of  breath?: Yes  Endocrine Excessive thirst?: No  Musculoskeletal Back pain?: Yes Joint pain?: Yes  Neurological Headaches?: No Dizziness?: No  Psychologic Depression?: No Anxiety?: No  Physical Exam: BP (!) 136/100 (BP Location: Left Arm, Patient Position: Sitting, Cuff Size: Normal)   Ht 5\' 9"  (1.753 m)   Wt 220 lb 8 oz (100 kg)   BMI 32.56 kg/m   Constitutional: Well nourished. Alert and oriented, No acute distress. HEENT: Center AT, moist mucus membranes. Trachea midline, no masses. Cardiovascular: No clubbing, cyanosis, or edema. Respiratory: Normal respiratory effort, no increased work of breathing. Skin: No rashes, bruises or suspicious lesions. Lymph: No cervical or inguinal adenopathy. Neurologic: Grossly intact, no focal deficits, moving all 4 extremities. Psychiatric: Normal mood and affect.  Laboratory Data: PSA History  1.1 in 02/2013  0.6 in 11/2013  1.1 in 11/2016  1.1 in 10/2017 Lab Results  Component Value Date   WBC 9.3 05/22/2017   HGB 13.2 05/22/2017   HCT 39.1 (L) 05/22/2017   MCV 90.3 05/22/2017   PLT 234 05/22/2017    Lab Results  Component Value Date   CREATININE 1.22 10/14/2017    Lab Results  Component Value Date   AST 32 06/19/2017   Lab Results  Component Value Date   ALT 30 06/19/2017   I have reviewed the labs.  Pertinent imaging Results for Carlos Reynolds, Carlos Reynolds (MRN [id number]) as of 11/27/2017 10:35  Ref. Range 11/27/2017 10:34  Scan Result Unknown 35 mL     Assessment & Plan:    1. History of hematuria Hematuria work up completed in 10/2017 - findings positive for patient unable to complete cystoscopy - urine cytology was negative for malignancy No report of gross hematuria  Urine cytology - noted dysmorphic erthrocytes -  will refer to nephrology -patient refused RTC in one year for UA - patient to report any gross hematuria in the interim     2. BPH with LU TS IPSS score is 25/5, it is worsening Continue conservative  management, avoiding bladder irritants and timed voiding's Most bothersome symptoms is/are nocturia x 3  Tamsulosin and Myrbetriq were cost prohibitive The alfuzosin provided some relief in his urinary symptoms -he does not want any further work-up at this time and will discontinue the alfuzosin   3. Erectile dysfunction SHIM score is 15, it is improved Patient still not satisfied with his erections, we did discuss intracavernosal injections and patient is not interested in this course of treatment at this time Continue sildenafil   4. Nocturia  - I explained to the patient that nocturia is often multi-factorial and difficult to treat.  Sleeping disorders, heart conditions, peripheral vascular disease, diabetes, an enlarged prostate for men, an urethral stricture causing bladder outlet obstruction and/or certain medications can contribute to nocturia.  - I have suggested that the patient avoid caffeine after noon and alcohol in the evening.  He or she may also benefit from fluid restrictions after 6:00 in the evening and voiding just prior to bedtime.  - I have explained that research studies have showed that over 84% of patients with sleep apnea reported frequent nighttime urination.   With sleep apnea, oxygen decreases, carbon dioxide increases, the blood become more acidic, the heart rate drops and blood vessels in the lung constrict.  The body is then alerted that something is very wrong. The sleeper must wake enough to reopen the airway. By this time, the heart is racing and experiences a false signal of fluid overload. The heart excretes a hormone-like protein that tells the body to get rid of sodium and water, resulting in nocturia.  -  I also informed the patient that a recent study noted that decreasing sodium intake to 2.3 grams daily, if they don't have issues with hyponatremia, can also reduce the number of nightly voids  - The patient may benefit from a discussion with his or her primary  care physician to see if he or she has risk factors for sleep apnea or other sleep disturbances and obtaining a sleep study.  Return in about 1 year (around 11/28/2018) for IPSS, SHIM, PSA , UA and exam.  These notes generated with voice recognition software. I apologize for typographical errors.  Michiel Cowboy, PA-C  [facility] [address] [place], [place] [address] [phone]

## 2018-01-13 ENCOUNTER — Other Ambulatory Visit: Payer: Self-pay

## 2018-01-13 ENCOUNTER — Ambulatory Visit
Payer: No Typology Code available for payment source | Attending: Student in an Organized Health Care Education/Training Program | Admitting: Student in an Organized Health Care Education/Training Program

## 2018-01-13 ENCOUNTER — Encounter: Payer: Self-pay | Admitting: Student in an Organized Health Care Education/Training Program

## 2018-01-13 VITALS — BP 155/90 | HR 69 | Temp 98.0°F | Ht 69.0 in | Wt 228.0 lb

## 2018-01-13 DIAGNOSIS — Z888 Allergy status to other drugs, medicaments and biological substances status: Secondary | ICD-10-CM | POA: Diagnosis not present

## 2018-01-13 DIAGNOSIS — G894 Chronic pain syndrome: Secondary | ICD-10-CM | POA: Diagnosis not present

## 2018-01-13 DIAGNOSIS — K760 Fatty (change of) liver, not elsewhere classified: Secondary | ICD-10-CM | POA: Diagnosis not present

## 2018-01-13 DIAGNOSIS — Z79891 Long term (current) use of opiate analgesic: Secondary | ICD-10-CM | POA: Diagnosis not present

## 2018-01-13 DIAGNOSIS — N433 Hydrocele, unspecified: Secondary | ICD-10-CM | POA: Insufficient documentation

## 2018-01-13 DIAGNOSIS — S46912A Strain of unspecified muscle, fascia and tendon at shoulder and upper arm level, left arm, initial encounter: Secondary | ICD-10-CM | POA: Diagnosis not present

## 2018-01-13 DIAGNOSIS — F419 Anxiety disorder, unspecified: Secondary | ICD-10-CM | POA: Insufficient documentation

## 2018-01-13 DIAGNOSIS — F329 Major depressive disorder, single episode, unspecified: Secondary | ICD-10-CM | POA: Diagnosis not present

## 2018-01-13 DIAGNOSIS — X58XXXA Exposure to other specified factors, initial encounter: Secondary | ICD-10-CM | POA: Diagnosis not present

## 2018-01-13 DIAGNOSIS — M25512 Pain in left shoulder: Secondary | ICD-10-CM | POA: Diagnosis present

## 2018-01-13 DIAGNOSIS — M79641 Pain in right hand: Secondary | ICD-10-CM | POA: Diagnosis not present

## 2018-01-13 DIAGNOSIS — M75100 Unspecified rotator cuff tear or rupture of unspecified shoulder, not specified as traumatic: Secondary | ICD-10-CM | POA: Diagnosis not present

## 2018-01-13 DIAGNOSIS — I10 Essential (primary) hypertension: Secondary | ICD-10-CM | POA: Diagnosis not present

## 2018-01-13 DIAGNOSIS — N401 Enlarged prostate with lower urinary tract symptoms: Secondary | ICD-10-CM | POA: Insufficient documentation

## 2018-01-13 DIAGNOSIS — Z79899 Other long term (current) drug therapy: Secondary | ICD-10-CM | POA: Diagnosis not present

## 2018-01-13 DIAGNOSIS — M79642 Pain in left hand: Secondary | ICD-10-CM | POA: Diagnosis not present

## 2018-01-13 DIAGNOSIS — R7303 Prediabetes: Secondary | ICD-10-CM | POA: Insufficient documentation

## 2018-01-13 DIAGNOSIS — N529 Male erectile dysfunction, unspecified: Secondary | ICD-10-CM | POA: Diagnosis not present

## 2018-01-13 DIAGNOSIS — M19011 Primary osteoarthritis, right shoulder: Secondary | ICD-10-CM | POA: Diagnosis not present

## 2018-01-13 DIAGNOSIS — F32A Depression, unspecified: Secondary | ICD-10-CM

## 2018-01-13 DIAGNOSIS — G8929 Other chronic pain: Secondary | ICD-10-CM

## 2018-01-13 DIAGNOSIS — K859 Acute pancreatitis without necrosis or infection, unspecified: Secondary | ICD-10-CM | POA: Diagnosis not present

## 2018-01-13 DIAGNOSIS — M19012 Primary osteoarthritis, left shoulder: Secondary | ICD-10-CM | POA: Diagnosis not present

## 2018-01-13 DIAGNOSIS — Z8249 Family history of ischemic heart disease and other diseases of the circulatory system: Secondary | ICD-10-CM | POA: Insufficient documentation

## 2018-01-13 DIAGNOSIS — F17210 Nicotine dependence, cigarettes, uncomplicated: Secondary | ICD-10-CM | POA: Insufficient documentation

## 2018-01-13 DIAGNOSIS — R310 Gross hematuria: Secondary | ICD-10-CM | POA: Insufficient documentation

## 2018-01-13 DIAGNOSIS — M75102 Unspecified rotator cuff tear or rupture of left shoulder, not specified as traumatic: Secondary | ICD-10-CM

## 2018-01-13 DIAGNOSIS — E785 Hyperlipidemia, unspecified: Secondary | ICD-10-CM | POA: Diagnosis not present

## 2018-01-13 MED ORDER — HYDROCODONE-ACETAMINOPHEN 7.5-325 MG PO TABS: 1.0000 | ORAL_TABLET | Freq: Two times a day (BID) | ORAL | 0 refills | Status: DC | PRN

## 2018-01-13 MED ORDER — GABAPENTIN 300 MG PO CAPS: 300.0000 mg | ORAL_CAPSULE | Freq: Two times a day (BID) | ORAL | 2 refills | Status: AC

## 2018-01-13 NOTE — Patient Instructions (Addendum)
You have been given 3 scripts for Hydrocodone today to last until 03-13-18.  Your gabapentin has been sent to the pharmacy for pick up.

## 2018-01-13 NOTE — Progress Notes (Signed)
Nursing Pain Medication Assessment:  Safety precautions to be maintained throughout the outpatient stay will include: orient to surroundings, keep bed in low position, maintain call bell within reach at all times, provide assistance with transfer out of bed and ambulation.  Medication Inspection Compliance: Pill count conducted under aseptic conditions, in front of the patient. Neither the pills nor the bottle was removed from the patient's sight at any time. Once count was completed pills were immediately returned to the patient in their original bottle.  Medication: Hydrocodone/APAP Pill/Patch Count: 0 of 75 pills remain Pill/Patch Appearance: Markings consistent with prescribed medication Bottle Appearance: Standard pharmacy container. Clearly labeled. Filled Date: 10 / 20 / 2019 Last Medication intake:  Yesterday

## 2018-01-13 NOTE — Progress Notes (Signed)
Patient's Name: Carlos Reynolds  MRN: [id number]  Referring Provider: Mikey College, *  DOB: [birth year]  PCP: Mikey College, NP  DOS: 01/13/2018  Note by: Gillis Santa, MD  Service setting: Ambulatory outpatient  Specialty: Interventional Pain Management  Location: [facility] (AMB) Pain Management Facility    Patient type: Established   Primary Reason(s) for Visit: Encounter for prescription drug management. (Level of risk: moderate)  CC: Shoulder Pain (left) and Hand Pain (bilateral)  HPI  Carlos Reynolds is a 62 y.o. year old, male patient, who comes today for a medication management evaluation. He has Osteoarthritis of left shoulder; Benign prostatic hyperplasia with incomplete bladder emptying; Erectile dysfunction; Current moderate episode of major depressive disorder without prior episode ([place]); Eczema; Depression; Chronic left shoulder pain; Chronic pain syndrome; Essential hypertension; Gastroesophageal reflux disease; Acute pancreatitis; Bilateral shoulder region arthritis; Epigastric mass; Hepatitis C antibody test positive; Hydrocele; Incomplete emptying of bladder; Inguinal hernia, bilateral; and Prediabetes on their problem list. His primarily concern today is the Shoulder Pain (left) and Hand Pain (bilateral)  Pain Assessment: Location: Left Shoulder Radiating: hands Onset: More than a month ago Duration: Chronic pain Quality: Aching, Constant Severity: 9 /10 (subjective, self-reported pain score)  Note: Reported level is inconsistent with clinical observations. Clinically the patient looks like a 4/10 A 4/10 is viewed as "Moderately Severe" and described as impossible to ignore for more than a few minutes. With effort, patients may still be able to manage work or participate in some social activities. Very difficult to concentrate. Signs of autonomic nervous system discharge are evident: dilated pupils (mydriasis); mild sweating (diaphoresis); sleep interference. Heart rate  becomes elevated (>115 bpm). Diastolic blood pressure (lower number) rises above 100 mmHg. Patients find relief in laying down and not moving.       When using our objective Pain Scale, levels between 6 and 10/10 are said to belong in an emergency room, as it progressively worsens from a 6/10, described as severely limiting, requiring emergency care not usually available at an outpatient pain management facility. At a 6/10 level, communication becomes difficult and requires great effort. Assistance to reach the emergency department may be required. Facial flushing and profuse sweating along with potentially dangerous increases in heart rate and blood pressure will be evident. Effect on ADL:   Timing: Constant Modifying factors: medications BP: (!) 155/90  HR: 69  Carlos Reynolds was last scheduled for an appointment on 11/02/2017 for medication management. During today's appointment we reviewed Carlos Reynolds's chronic pain status, as well as his outpatient medication regimen.   Continuing to have severe left shoulder pain due to rotator cuff injury and severe osteoarthritis.  Patient has been approved for disability and states that he will be getting his Medicare card soon.  At that time he will try and schedule left shoulder surgery which he has already been offered but was unable to afford given lack of Medicare status.  Patient endorses worsening pain in his hands.  He has been applying Voltaren gel which he states has been helping.  We discussed application of heat to his hands prior to application of Voltaren gel  The patient  reports that he does not use drugs. His body mass index is 33.67 kg/m.  Further details on both, my assessment(s), as well as the proposed treatment plan, please see below.  Controlled Substance Pharmacotherapy Assessment REMS (Risk Evaluation and Mitigation Strategy)  Analgesic: Hydrocodone 7.5 mg twice daily as needed, quantity 75/month MME/day: 15 mg/day.  [name], RN  01/13/2018 10:52 AM  Sign at close encounter Nursing Pain Medication Assessment:  Safety precautions to be maintained throughout the outpatient stay will include: orient to surroundings, keep bed in low position, maintain call bell within reach at all times, provide assistance with transfer out of bed and ambulation.  Medication Inspection Compliance: Pill count conducted under aseptic conditions, in front of the patient. Neither the pills nor the bottle was removed from the patient's sight at any time. Once count was completed pills were immediately returned to the patient in their original bottle.  Medication: Hydrocodone/APAP Pill/Patch Count: 0 of 75 pills remain Pill/Patch Appearance: Markings consistent with prescribed medication Bottle Appearance: Standard pharmacy container. Clearly labeled. Filled Date: 40 / 20 / 2019 Last Medication intake:  Yesterday   Pharmacokinetics: Liberation and absorption (onset of action): WNL Distribution (time to peak effect): WNL Metabolism and excretion (duration of action): WNL         Pharmacodynamics: Desired effects: Analgesia: Carlos Reynolds reports >50% benefit. Functional ability: Patient reports that medication allows him to accomplish basic ADLs Clinically meaningful improvement in function (CMIF): Sustained CMIF goals met Perceived effectiveness: Described as relatively effective, allowing for increase in activities of daily living (ADL) Undesirable effects: Side-effects or Adverse reactions: None reported Monitoring: [place] PMP: Online review of the past 66-monthperiod conducted. Compliant with practice rules and regulations Last UDS on record: Summary  Date Value Ref Range Status  01/21/2017 FINAL  Final    Comment:    ==================================================================== [facility] COMP DRUG ANALYSIS,UR ==================================================================== Test                             Result       Flag        Units Drug Present and Declared for Prescription Verification   Fluoxetine                     PRESENT      EXPECTED   Norfluoxetine                  PRESENT      EXPECTED    Norfluoxetine is an expected metabolite of fluoxetine. Drug Absent but Declared for Prescription Verification   Gabapentin                     Not Detected UNEXPECTED   Diclofenac                     Not Detected UNEXPECTED    Diclofenac, as indicated in the declared medication list, is not    always detected even when used as directed. ==================================================================== Test                      Result    Flag   Units      Ref Range   Creatinine              57               mg/dL      >=20 ==================================================================== Declared Medications:  The flagging and interpretation on this report are based on the  following declared medications.  Unexpected results may arise from  inaccuracies in the declared medications.  **Note: The testing scope of this panel includes these medications:  Fluoxetine  Gabapentin  **Note: The testing scope of this panel does not include small to  moderate amounts  of these reported medications:  Diclofenac  **Note: The testing scope of this panel does not include following  reported medications:  Hydrochlorothiazide  Omeprazole  Sildenafil  Tamsulosin ==================================================================== For clinical consultation, please call [phone]. ====================================================================    UDS interpretation: Compliant          Medication Assessment Form: Reviewed. Patient indicates being compliant with therapy Treatment compliance: Compliant Risk Assessment Profile: Aberrant behavior: See prior evaluations. None observed or detected today Comorbid factors increasing risk of overdose: See prior notes. No additional risks detected today Opioid risk  tool (ORT) (Total Score): 1 Personal History of Substance Abuse (SUD-Substance use disorder):  Alcohol: Negative  Illegal Drugs: Negative  Rx Drugs: Negative  ORT Risk Level calculation: Low Risk Risk of substance use disorder (SUD): Low Opioid Risk Tool - 01/13/18 1054      Family History of Substance Abuse   Alcohol  Negative    Illegal Drugs  Negative    Rx Drugs  Negative      Personal History of Substance Abuse   Alcohol  Negative    Illegal Drugs  Negative    Rx Drugs  Negative      Age   Age between 68-45 years   No      History of Preadolescent Sexual Abuse   History of Preadolescent Sexual Abuse  Negative or Male      Psychological Disease   Psychological Disease  Negative    Depression  Positive      Total Score   Opioid Risk Tool Scoring  1    Opioid Risk Interpretation  Low Risk      ORT Scoring interpretation table:  Score <3 = Low Risk for SUD  Score between 4-7 = Moderate Risk for SUD  Score >8 = High Risk for Opioid Abuse   Risk Mitigation Strategies:  Patient Counseling: Covered Patient-Prescriber Agreement (PPA): Present and active  Notification to other healthcare providers: Done  Pharmacologic Plan: No change in therapy, at this time.             Laboratory Chemistry  Inflammation Markers (CRP: Acute Phase) (ESR: Chronic Phase) No results found for: CRP, ESRSEDRATE, LATICACIDVEN                       Rheumatology Markers No results found for: RF, ANA, LABURIC, URICUR, LYMEIGGIGMAB, LYMEABIGMQN, HLAB27                      Renal Function Markers Lab Results  Component Value Date   BUN 13 10/14/2017   CREATININE 1.22 10/14/2017   BCR 11 10/14/2017   GFRAA 74 10/14/2017   GFRNONAA 64 10/14/2017                             Hepatic Function Markers Lab Results  Component Value Date   AST 32 06/19/2017   ALT 30 06/19/2017   ALBUMIN 3.0 (L) 05/23/2017   ALKPHOS 64 05/23/2017   LIPASE 28 05/23/2017                         Electrolytes Lab Results  Component Value Date   NA 140 06/19/2017   K 3.9 06/19/2017   CL 107 06/19/2017   CALCIUM 9.1 06/19/2017  Neuropathy Markers Lab Results  Component Value Date   HGBA1C 5.5 06/19/2017   HIV Non Reactive 05/21/2017                        CNS Tests No results found for: COLORCSF, APPEARCSF, RBCCOUNTCSF, WBCCSF, POLYSCSF, LYMPHSCSF, EOSCSF, PROTEINCSF, GLUCCSF, JCVIRUS, CSFOLI, IGGCSF                      Bone Pathology Markers Lab Results  Component Value Date   TESTOSTERONE 439 12/04/2016                         Coagulation Parameters Lab Results  Component Value Date   PLT 234 05/22/2017                        Cardiovascular Markers Lab Results  Component Value Date   HGB 13.2 05/22/2017   HCT 39.1 (L) 05/22/2017                         CA Markers No results found for: CEA, CA125, LABCA2                      Note: Lab results reviewed.  Recent Diagnostic Imaging Results  CT HEMATURIA WORKUP CLINICAL DATA:  Patient with gross hematuria.  EXAM: CT ABDOMEN AND PELVIS WITHOUT AND WITH CONTRAST  TECHNIQUE: Multidetector CT imaging of the abdomen and pelvis was performed following the standard protocol before and following the bolus administration of intravenous contrast.  CONTRAST:  181m ISOVUE-300 IOPAMIDOL (ISOVUE-300) INJECTION 61%  COMPARISON:  Renal stone CT 05/20/2017  FINDINGS: Lower chest: Normal heart size. Lingular atelectasis. No pleural effusion.  Hepatobiliary: Liver is diffusely low in attenuation compatible with steatosis. No focal hepatic lesion is identified. Gallbladder is unremarkable. No intrahepatic or extrahepatic biliary ductal dilatation.  Pancreas: Unremarkable  Spleen: Unremarkable  Adrenals/Urinary Tract: Normal adrenal glands. Noncontrast images demonstrate no nephroureterolithiasis. No hydronephrosis. Kidneys enhance symmetrically with contrast. No suspicious enhancing  renal mass identified. Delayed images demonstrate excretion of contrast material into the bilateral renal collecting systems, ureters and bladder. No definitive filling defects are identified.  Stomach/Bowel: No abnormal bowel wall thickening or evidence for bowel obstruction. No free fluid or free intraperitoneal air. Normal appendix. Normal morphology of the stomach.  Vascular/Lymphatic: Normal caliber abdominal aorta. Peripheral calcified atherosclerotic plaque. No retroperitoneal lymphadenopathy.  Reproductive: Prostate unremarkable.  Other: Fat containing left inguinal hernia.  Musculoskeletal: Lower thoracic and lumbar spine degenerative changes. No aggressive or acute appearing osseous lesions.  IMPRESSION: 1. No nephroureterolithiasis. No hydronephrosis. No suspicious enhancing renal mass. No filling defects identified within the opacified renal collecting systems and ureters on delayed images. 2. Hepatic steatosis.  Electronically Signed   By: DLovey NewcomerM.D.   On: 11/07/2017 15:43  Complexity Note: Imaging results reviewed. Results shared with Carlos Reynolds using Layman's terms.                         Meds   Current Outpatient Medications:  .  alfuzosin (UROXATRAL) 10 MG 24 hr tablet, Take 1 tablet (10 mg total) by mouth daily with breakfast., Disp: 90 tablet, Rfl: 3 .  diazepam (VALIUM) 10 MG tablet, Take one tablet 30 minutes prior to cysto, Disp: 1 tablet, Rfl: 0 .  diclofenac (VOLTAREN) 75 MG EC tablet,  Take by mouth., Disp: , Rfl:  .  FLUoxetine (PROZAC) 20 MG capsule, Take 1 capsule (20 mg total) by mouth daily., Disp: 90 capsule, Rfl: 1 .  gabapentin (NEURONTIN) 300 MG capsule, Take 1 capsule (300 mg total) by mouth 2 (two) times daily., Disp: 60 capsule, Rfl: 2 .  HYDROcodone-acetaminophen (NORCO) 7.5-325 MG tablet, Take 1 tablet by mouth 2 (two) times daily as needed for moderate pain. For chronic pain.  To fill on or after 01/13/2018, 02/11/2018,  03/13/2018, Disp: 75 tablet, Rfl: 0 .  omeprazole (PRILOSEC) 20 MG capsule, TAKE 1 CAPSULE BY MOUTH ONCE DAILY, Disp: , Rfl: 1 .  hydrALAZINE (APRESOLINE) 25 MG tablet, Take 25 mg by mouth every 8 (eight) hours., Disp: , Rfl: 1  ROS  Constitutional: Denies any fever or chills Gastrointestinal: No reported hemesis, hematochezia, vomiting, or acute GI distress Musculoskeletal: Denies any acute onset joint swelling, redness, loss of ROM, or weakness Neurological: No reported episodes of acute onset apraxia, aphasia, dysarthria, agnosia, amnesia, paralysis, loss of coordination, or loss of consciousness  Allergies  Carlos Reynolds is allergic to shellfish allergy; amlodipine; lisinopril; and tape.  PFSH  Drug: Carlos Reynolds  reports that he does not use drugs. Alcohol:  reports that he drinks about 2.0 standard drinks of alcohol per week. Tobacco:  reports that he has been smoking cigarettes. He has a 23.00 pack-year smoking history. He has never used smokeless tobacco. Medical:  has a past medical history of Anxiety, Arthritis, Depression, Heartburn, Hematuria, HLD (hyperlipidemia), Hypertension, Kidney stone, and Pancreatitis (05/20/2017). Surgical: Carlos Reynolds  has a past surgical history that includes No past surgeries; Colonoscopy; and Colonoscopy with propofol (N/A, 04/15/2017). Family: family history includes Colon cancer in his brother and mother; Diabetes in his father; Heart disease in his brother and mother; Prostate cancer in his father.  Constitutional Exam  General appearance: Well nourished, well developed, and well hydrated. In no apparent acute distress Vitals:   01/13/18 1048  BP: (!) 155/90  Pulse: 69  Temp: 98 F (36.7 C)  TempSrc: Oral  SpO2: 99%  Weight: 228 lb (103.4 kg)  Height: 5' 9"  (1.753 m)   BMI Assessment: Estimated body mass index is 33.67 kg/m as calculated from the following:   Height as of this encounter: 5' 9"  (1.753 m).   Weight as of this encounter: 228 lb  (103.4 kg).  BMI interpretation table: BMI level Category Range association with higher incidence of chronic pain  <18 kg/m2 Underweight   18.5-24.9 kg/m2 Ideal body weight   25-29.9 kg/m2 Overweight Increased incidence by 20%  30-34.9 kg/m2 Obese (Class I) Increased incidence by 68%  35-39.9 kg/m2 Severe obesity (Class II) Increased incidence by 136%  >40 kg/m2 Extreme obesity (Class III) Increased incidence by 254%   Patient's current BMI Ideal Body weight  Body mass index is 33.67 kg/m. Ideal body weight: 70.7 kg (155 lb 13.8 oz) Adjusted ideal body weight: 83.8 kg (184 lb 11.5 oz)   BMI Readings from Last 4 Encounters:  01/13/18 33.67 kg/m  11/27/17 32.56 kg/m  11/11/17 32.71 kg/m  10/14/17 33.67 kg/m   Wt Readings from Last 4 Encounters:  01/13/18 228 lb (103.4 kg)  11/27/17 220 lb 8 oz (100 kg)  11/11/17 228 lb (103.4 kg)  10/14/17 228 lb (103.4 kg)  Psych/Mental status: Alert, oriented x 3 (person, place, & time)       Eyes: PERLA Respiratory: No evidence of acute respiratory distress  Cervical Spine Area Exam  Skin &  Axial Inspection: No masses, redness, edema, swelling, or associated skin lesions Alignment: Symmetrical Functional ROM: Unrestricted ROM      Stability: No instability detected Muscle Tone/Strength: Functionally intact. No obvious neuro-muscular anomalies detected. Sensory (Neurological): Unimpaired Palpation: No palpable anomalies              Upper Extremity (UE) Exam    Side:Right upper extremity  Side:Left upper extremity   Skin & Extremity Inspection:Skin color, temperature, and hair growth are WNL. No peripheral edema or cyanosis. No masses, redness, swelling, asymmetry, or associated skin lesions. No contractures.  Skin & Extremity Inspection:Atrophy   Functional SFK:CLEXNTZGYFVC ROM  Functional BSW:HQPRFFMBW ROM   Muscle Tone/Strength:Functionally intact. No obvious neuro-muscular anomalies detected.  Muscle  Tone/Strength:Functionally intact. No obvious neuro-muscular anomalies detected.   Sensory (Neurological):Unimpaired  Sensory (Neurological):Arthropathic arthralgia   Palpation:No palpable anomalies  Palpation:Complains of area being tender to palpation   Provocative Test(s): Phalen's test:deferred Tinel's test:deferred Apley's scratch test (touch opposite shoulder): Action 1 (Across chest):deferred Action 2 (Overhead):deferred Action 3 (LB reach):deferred   Provocative Test(s): Phalen's test:deferred Tinel's test:deferred Apley's scratch test (touch opposite shoulder): Action 1 (Across chest):Decreased ROM Action 2 (Overhead):Decreased ROM Action 3 (LB reach):Decreased ROMwas     Thoracic Spine Area Exam  Skin & Axial Inspection: No masses, redness, or swelling Alignment: Symmetrical Functional ROM: Unrestricted ROM Stability: No instability detected Muscle Tone/Strength: Functionally intact. No obvious neuro-muscular anomalies detected. Sensory (Neurological): Unimpaired Muscle strength & Tone: No palpable anomalies  Lumbar Spine Area Exam  Skin & Axial Inspection: No masses, redness, or swelling Alignment: Symmetrical Functional ROM: Unrestricted ROM       Stability: No instability detected Muscle Tone/Strength: Functionally intact. No obvious neuro-muscular anomalies detected. Sensory (Neurological): Unimpaired Palpation: No palpable anomalies       Provocative Tests: Hyperextension/rotation test: deferred today       Lumbar quadrant test (Kemp's test): deferred today       Lateral bending test: deferred today       Patrick's Maneuver: deferred today                   FABER test: deferred today                   S-I anterior distraction/compression test: deferred today         S-I lateral compression test: deferred today         S-I Thigh-thrust test: deferred today         S-I Gaenslen's test:  deferred today          Gait & Posture Assessment  Ambulation: Unassisted Gait: Relatively normal for age and body habitus Posture: WNL   Lower Extremity Exam    Side: Right lower extremity  Side: Left lower extremity  Stability: No instability observed          Stability: No instability observed          Skin & Extremity Inspection: Skin color, temperature, and hair growth are WNL. No peripheral edema or cyanosis. No masses, redness, swelling, asymmetry, or associated skin lesions. No contractures.  Skin & Extremity Inspection: Skin color, temperature, and hair growth are WNL. No peripheral edema or cyanosis. No masses, redness, swelling, asymmetry, or associated skin lesions. No contractures.  Functional ROM: Unrestricted ROM                  Functional ROM: Unrestricted ROM  Muscle Tone/Strength: Functionally intact. No obvious neuro-muscular anomalies detected.  Muscle Tone/Strength: Functionally intact. No obvious neuro-muscular anomalies detected.  Sensory (Neurological): Unimpaired medial portion of foot (L4)  Sensory (Neurological): Unimpaired medial portion of foot (L4)  DTR: Patellar: deferred today Achilles: deferred today Plantar: deferred today  DTR: Patellar: deferred today Achilles: deferred today Plantar: deferred today  Palpation: No palpable anomalies  Palpation: No palpable anomalies   Assessment  Primary Diagnosis & Pertinent Problem List: The primary encounter diagnosis was Chronic pain syndrome. Diagnoses of Tear of left supraspinatus tendon, Chronic left shoulder pain, Primary osteoarthritis of left shoulder, Depression, unspecified depression type, and Chronic prescription opiate use were also pertinent to this visit.  Status Diagnosis  Controlled Controlled Controlled 1. Chronic pain syndrome   2. Tear of left supraspinatus tendon   3. Chronic left shoulder pain   4. Primary osteoarthritis of left shoulder   5. Depression, unspecified  depression type   6. Chronic prescription opiate use      General Recommendations: The pain condition that the patient suffers from is best treated with a multidisciplinary approach that involves an increase in physical activity to prevent de-conditioning and worsening of the pain cycle, as well as psychological counseling (formal and/or informal) to address the co-morbid psychological affects of pain. Treatment will often involve judicious use of pain medications and interventional procedures to decrease the pain, allowing the patient to participate in the physical activity that will ultimately produce long-lasting pain reductions. The goal of the multidisciplinary approach is to return the patient to a higher level of overall function and to restore their ability to perform activities of daily living.  62 year old male with a history of left shoulder pain secondary to left shoulder osteoarthritis, tear of left supraspinatus tendon that was seen on his most recent left shoulder MRI. Patient has significant immobility of his left shoulder as well as severe pain which results in significant disability.  Continuing to have severe left shoulder pain due to rotator cuff injury and severe osteoarthritis.  Patient has been approved for disability and states that he will be getting his Medicare card soon.  At that time he will try and schedule left shoulder surgery which he has already been offered but was unable to afford given lack of Medicare status.  Patient endorses worsening pain in his hands.  He has been applying Voltaren gel which he states has been helping.  We discussed application of heat to his hands prior to application of Voltaren gel.  Repeat UDS, refill of Hydrocodone and Gabapentin as below. Encouraged patient to continue with heat and voltaren gel application to his hands.  Plan of Care  Pharmacotherapy (Medications Ordered): Meds ordered this encounter  Medications  . DISCONTD:  HYDROcodone-acetaminophen (NORCO) 7.5-325 MG tablet    Sig: Take 1 tablet by mouth 2 (two) times daily as needed for moderate pain. For chronic pain.  To fill on or after 01/13/2018, 02/11/2018, 03/13/2018    Dispense:  75 tablet    Refill:  0  . DISCONTD: HYDROcodone-acetaminophen (NORCO) 7.5-325 MG tablet    Sig: Take 1 tablet by mouth 2 (two) times daily as needed for moderate pain. For chronic pain.  To fill on or after 01/13/2018, 02/11/2018, 03/13/2018    Dispense:  75 tablet    Refill:  0  . HYDROcodone-acetaminophen (NORCO) 7.5-325 MG tablet    Sig: Take 1 tablet by mouth 2 (two) times daily as needed for moderate pain. For chronic pain.  To fill on  or after 01/13/2018, 02/11/2018, 03/13/2018    Dispense:  75 tablet    Refill:  0  . gabapentin (NEURONTIN) 300 MG capsule    Sig: Take 1 capsule (300 mg total) by mouth 2 (two) times daily.    Dispense:  60 capsule    Refill:  2   Lab-work, procedure(s), and/or referral(s): Orders Placed This Encounter  Procedures  . ToxASSURE Select 13 (MW), Urine   Provider-requested follow-up: Return in about 3 months (around 04/15/2018) for Medication Management.  Future Appointments  Date Time Provider Four [place]  02/10/2018  9:00 AM Virgel Manifold, MD [facility] None  04/02/2018 10:30 AM Gillis Santa, MD [facility] None  11/26/2018 10:30 AM [facility] None  12/01/2018 10:15 AM McGowan, Hunt Oris, PA-C [facility] None    Primary Care Physician: Mikey College, NP Location: [facility] Note by: Gillis Santa, M.D Date: 01/13/2018; Time: 12:09 PM  Patient Instructions  You have been given 3 scripts for Hydrocodone today to last until 03-13-18.  Your gabapentin has been sent to the pharmacy for pick up.

## 2018-01-16 LAB — TOXASSURE SELECT 13 (MW), URINE

## 2018-01-19 ENCOUNTER — Encounter: Payer: Self-pay | Admitting: Student in an Organized Health Care Education/Training Program

## 2018-02-03 ENCOUNTER — Ambulatory Visit: Payer: No Typology Code available for payment source | Admitting: Gastroenterology

## 2018-02-10 ENCOUNTER — Ambulatory Visit: Payer: No Typology Code available for payment source | Admitting: Gastroenterology

## 2018-02-11 ENCOUNTER — Telehealth: Payer: Self-pay | Admitting: Student in an Organized Health Care Education/Training Program

## 2018-02-11 ENCOUNTER — Telehealth: Payer: Self-pay | Admitting: Nurse Practitioner

## 2018-02-11 ENCOUNTER — Telehealth: Payer: Self-pay | Admitting: *Deleted

## 2018-02-11 NOTE — Telephone Encounter (Signed)
Pt states that he needs PA for his medication. [facility] uses [facility] in [place]

## 2018-02-11 NOTE — Telephone Encounter (Signed)
No- no further opioids from our practice given violation of pain contract.

## 2018-02-11 NOTE — Telephone Encounter (Signed)
Called the patient back, states he has just been approved for Medicaid and just called and gave the number to us.  The Medicine that he needs approval for is hydrocodone - apap and this will be the first time that he is receiving coverage through Medicaid.

## 2018-02-11 NOTE — Telephone Encounter (Signed)
Mr Carlos Reynolds called wanting a PA completed for his medication.  Carlos Reynolds made me aware of the letter that was sent out notifying him that there was an illegal substance in his urine. I will send to Carlos Reynolds to see if he wants me to proceed.

## 2018-03-10 NOTE — Telephone Encounter (Signed)
error 

## 2018-04-02 ENCOUNTER — Encounter
Payer: No Typology Code available for payment source | Admitting: Student in an Organized Health Care Education/Training Program

## 2018-06-23 ENCOUNTER — Other Ambulatory Visit: Payer: Self-pay | Admitting: Urology

## 2018-06-23 NOTE — Telephone Encounter (Signed)
Patient requesting refill of Sildenafil to be [facility] in [place], [place].

## 2018-06-24 MED ORDER — SILDENAFIL CITRATE 20 MG PO TABS: ORAL_TABLET | ORAL | 1 refills | Status: DC

## 2018-06-24 NOTE — Telephone Encounter (Signed)
Left patient VM informing him refill sent to pharmacy as requested.

## 2018-11-23 ENCOUNTER — Other Ambulatory Visit: Payer: Self-pay | Admitting: *Deleted

## 2018-11-23 DIAGNOSIS — Z87448 Personal history of other diseases of urinary system: Secondary | ICD-10-CM

## 2018-11-23 IMAGING — MR MR SHOULDER*L* W/O CM
5 series · 40 of 40 positions shown · non-contrast
Comparison: None.

CLINICAL DATA: Chronic left shoulder pain.

EXAM:
MRI OF THE LEFT SHOULDER WITHOUT CONTRAST
TECHNIQUE: Multiplanar, multisequence MR imaging of the shoulder was performed.
No intravenous contrast was administered.

[Series 4: T2 fat-sat · oblique · 4.0mm · 0.62mm/px · 7 of 23 slices shown (1 of 3)]
[im 1/23]
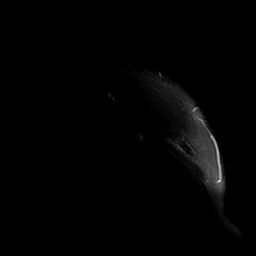
[im 4/23]
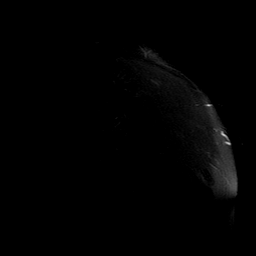
[im 8/23]
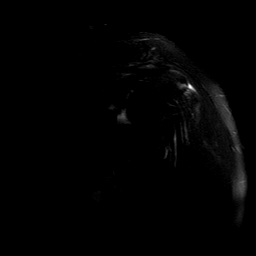
[im 12/23]
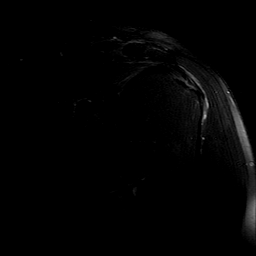
[im 15/23]
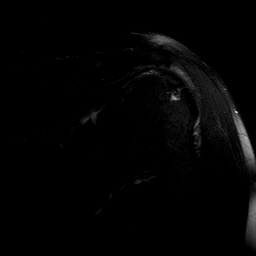
[im 19/23]
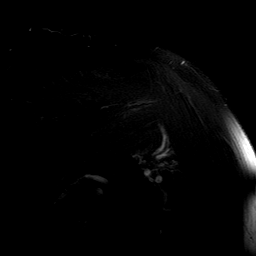
[im 23/23]
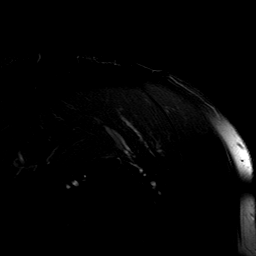

[Series 5: PD · oblique · 4.0mm · 0.62mm/px · 8 of 23 slices shown]
[im 1/23]
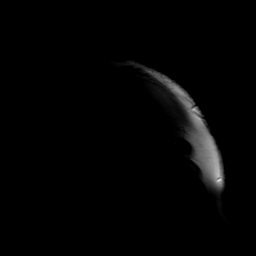
[im 4/23]
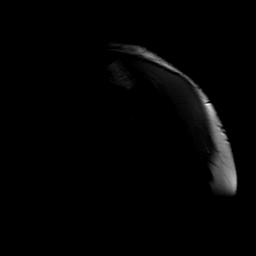
[im 7/23]
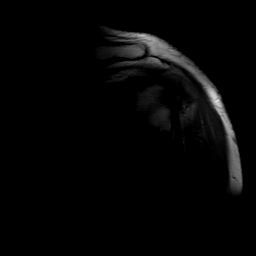
[im 10/23]
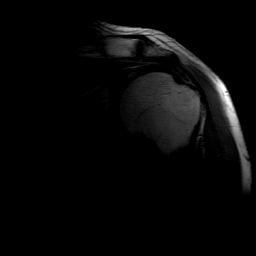
[im 13/23]
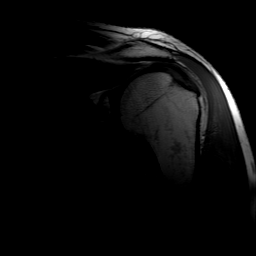
[im 16/23]
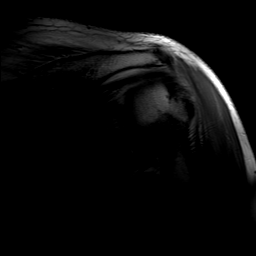
[im 19/23]
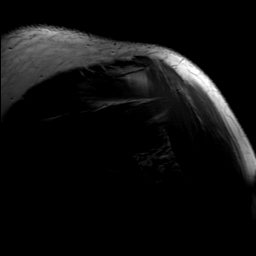
[im 23/23]
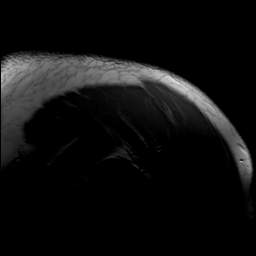

[Series 6: T1 · oblique · 4.0mm · 0.62mm/px · 8 of 23 slices shown]
[im 1/23]
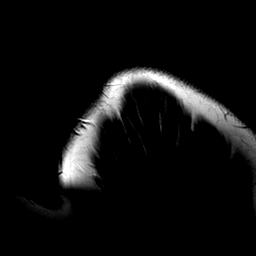
[im 4/23]
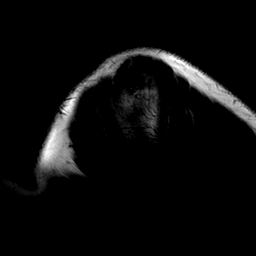
[im 7/23]
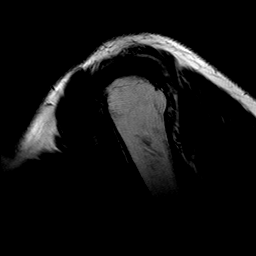
[im 10/23]
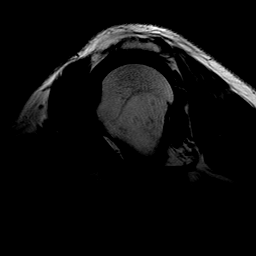
[im 13/23]
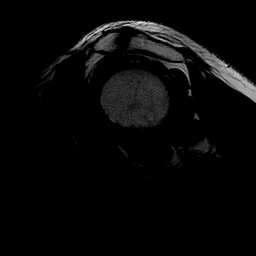
[im 16/23]
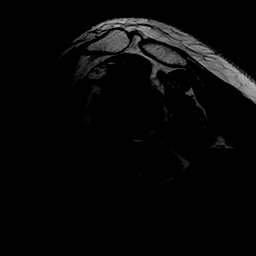
[im 19/23]
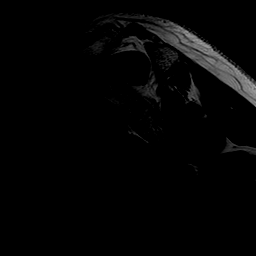
[im 23/23]
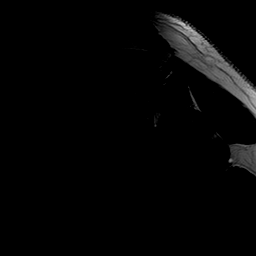

[Series 7: T2 fat-sat · oblique · 4.0mm · 0.62mm/px · 8 of 23 slices shown (2 of 3)]
[im 1/23]
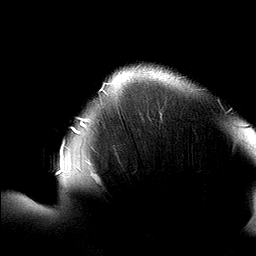
[im 4/23]
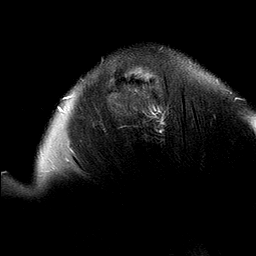
[im 7/23]
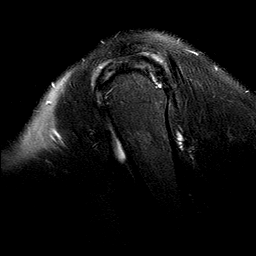
[im 10/23]
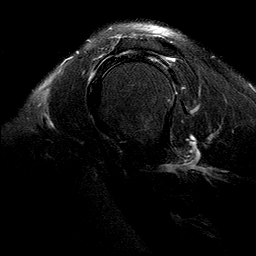
[im 13/23]
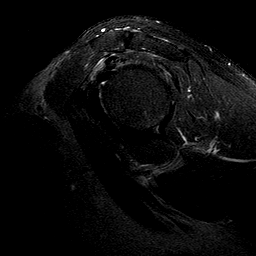
[im 16/23]
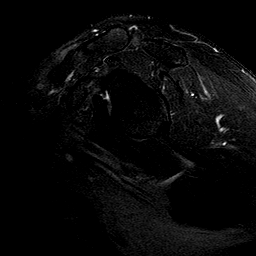
[im 19/23]
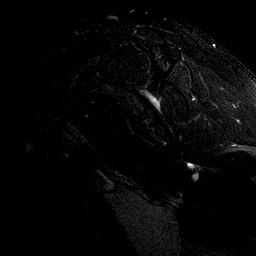
[im 23/23]
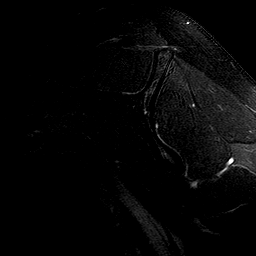

[Series 8: T2 fat-sat · axial · 4.0mm · 0.47mm/px · z∈[-37,+69]mm · 9 of 25 slices shown (3 of 3)]
[im 1/25]
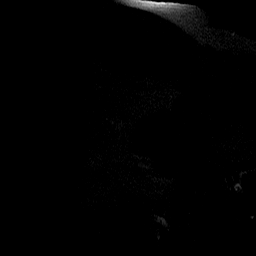
[im 4/25]
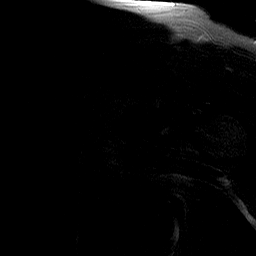
[im 7/25]
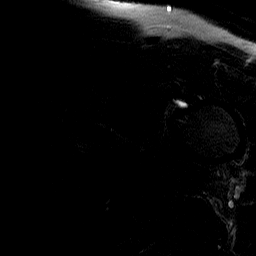
[im 10/25]
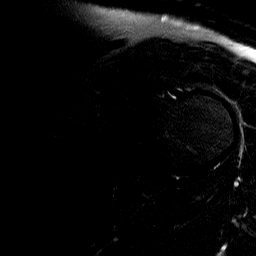
[im 13/25]
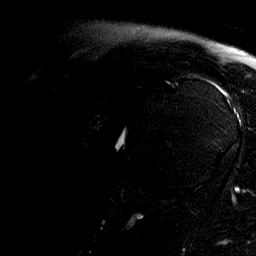
[im 16/25]
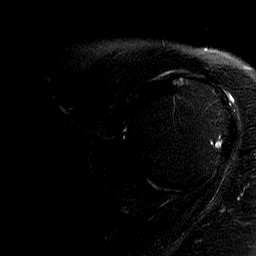
[im 19/25]
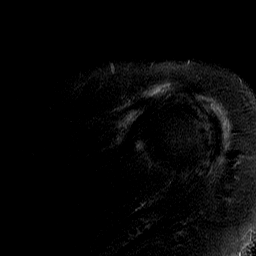
[im 22/25]
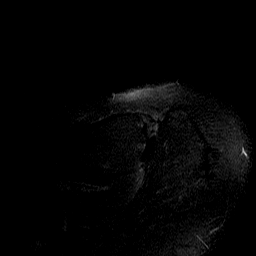
[im 25/25]
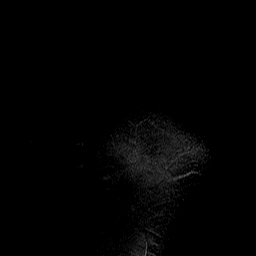

[40 of 40 positions shown; findings below may reference images not displayed]

FINDINGS: Rotator cuff: There is a deep non retracted partial-thickness bursal
surface tear of the distal supraspinatus tendon, 17 mm in width,
best seen on images 5 and 6 of series 7 and on images 13 through 16
of series 4. The rotator cuff is otherwise intact.

Muscles: No atrophy or abnormal signal of the muscles of the rotator
cuff.

Biceps long head:  Properly located and intact.

Acromioclavicular Joint:  Normal.  Type 2 acromion. No bursitis.

Glenohumeral Joint: Normal.

Labrum:  Normal.

Bones:  Normal.

Other: None
IMPRESSION: Partial-thickness linear articular surface tear of the anterior
aspect of the distal supraspinatus tendon extending most of the way
through the tendon. No retraction.

## 2018-11-26 ENCOUNTER — Other Ambulatory Visit: Payer: No Typology Code available for payment source

## 2018-11-26 ENCOUNTER — Encounter: Payer: Self-pay | Admitting: Urology

## 2018-12-01 ENCOUNTER — Ambulatory Visit: Payer: No Typology Code available for payment source | Admitting: Urology

## 2018-12-01 ENCOUNTER — Encounter: Payer: Self-pay | Admitting: Urology

## 2019-01-13 ENCOUNTER — Encounter: Payer: Self-pay | Admitting: Urology

## 2019-04-28 ENCOUNTER — Emergency Department (HOSPITAL_COMMUNITY): Payer: Medicaid Other

## 2019-04-28 ENCOUNTER — Inpatient Hospital Stay (HOSPITAL_COMMUNITY)
Admission: EM | Admit: 2019-04-28 | Discharge: 2019-05-04 | DRG: 964 | Disposition: A | Discharge status: Home Health | Payer: Medicaid Other | Attending: General Surgery | Admitting: General Surgery

## 2019-04-28 DIAGNOSIS — S2241XA Multiple fractures of ribs, right side, initial encounter for closed fracture: Secondary | ICD-10-CM | POA: Diagnosis present

## 2019-04-28 DIAGNOSIS — T797XXA Traumatic subcutaneous emphysema, initial encounter: Secondary | ICD-10-CM | POA: Diagnosis present

## 2019-04-28 DIAGNOSIS — I471 Supraventricular tachycardia: Secondary | ICD-10-CM | POA: Diagnosis not present

## 2019-04-28 DIAGNOSIS — Y906 Blood alcohol level of 120-199 mg/100 ml: Secondary | ICD-10-CM | POA: Diagnosis present

## 2019-04-28 DIAGNOSIS — Z91013 Allergy to seafood: Secondary | ICD-10-CM | POA: Diagnosis not present

## 2019-04-28 DIAGNOSIS — S36113A Laceration of liver, unspecified degree, initial encounter: Secondary | ICD-10-CM | POA: Diagnosis present

## 2019-04-28 DIAGNOSIS — K7030 Alcoholic cirrhosis of liver without ascites: Secondary | ICD-10-CM | POA: Diagnosis present

## 2019-04-28 DIAGNOSIS — G8929 Other chronic pain: Secondary | ICD-10-CM | POA: Diagnosis present

## 2019-04-28 DIAGNOSIS — Z20822 Contact with and (suspected) exposure to covid-19: Secondary | ICD-10-CM | POA: Diagnosis present

## 2019-04-28 DIAGNOSIS — M25511 Pain in right shoulder: Secondary | ICD-10-CM | POA: Diagnosis present

## 2019-04-28 DIAGNOSIS — J432 Centrilobular emphysema: Secondary | ICD-10-CM | POA: Diagnosis present

## 2019-04-28 DIAGNOSIS — S270XXA Traumatic pneumothorax, initial encounter: Principal | ICD-10-CM | POA: Diagnosis present

## 2019-04-28 DIAGNOSIS — J939 Pneumothorax, unspecified: Secondary | ICD-10-CM

## 2019-04-28 DIAGNOSIS — I10 Essential (primary) hypertension: Secondary | ICD-10-CM | POA: Diagnosis present

## 2019-04-28 DIAGNOSIS — N179 Acute kidney failure, unspecified: Secondary | ICD-10-CM | POA: Diagnosis present

## 2019-04-28 DIAGNOSIS — Z23 Encounter for immunization: Secondary | ICD-10-CM

## 2019-04-28 LAB — COMPREHENSIVE METABOLIC PANEL
ALT: 1011 U/L — ABNORMAL HIGH (ref 0–44)
AST: 1213 U/L — ABNORMAL HIGH (ref 15–41)
Albumin: 3.6 g/dL (ref 3.5–5.0)
Alkaline Phosphatase: 65 U/L (ref 38–126)
Anion gap: 13 (ref 5–15)
BUN: 17 mg/dL (ref 8–23)
CO2: 21 mmol/L — ABNORMAL LOW (ref 22–32)
Calcium: 8.8 mg/dL — ABNORMAL LOW (ref 8.9–10.3)
Chloride: 104 mmol/L (ref 98–111)
Creatinine, Ser: 1.5 mg/dL — ABNORMAL HIGH (ref 0.61–1.24)
GFR calc Af Amer: 57 mL/min — ABNORMAL LOW (ref >60)
GFR calc non Af Amer: 49 mL/min — ABNORMAL LOW (ref >60)
Glucose, Bld: 140 mg/dL — ABNORMAL HIGH (ref 70–99)
Potassium: 3.4 mmol/L — ABNORMAL LOW (ref 3.5–5.1)
Sodium: 138 mmol/L (ref 135–145)
Total Bilirubin: 0.7 mg/dL (ref 0.3–1.2)
Total Protein: 6.6 g/dL (ref 6.5–8.1)

## 2019-04-28 LAB — I-STAT CHEM 8, ED
BUN: 21 mg/dL (ref 8–23)
Calcium, Ion: 1.08 mmol/L — ABNORMAL LOW (ref 1.15–1.40)
Chloride: 106 mmol/L (ref 98–111)
Creatinine, Ser: 1.7 mg/dL — ABNORMAL HIGH (ref 0.61–1.24)
Glucose, Bld: 132 mg/dL — ABNORMAL HIGH (ref 70–99)
HCT: 44 % (ref 39.0–52.0)
Hemoglobin: 15 g/dL (ref 13.0–17.0)
Potassium: 3.5 mmol/L (ref 3.5–5.1)
Sodium: 141 mmol/L (ref 135–145)
TCO2: 25 mmol/L (ref 22–32)

## 2019-04-28 LAB — CBC
HCT: 43.9 % (ref 39.0–52.0)
Hemoglobin: 14.1 g/dL (ref 13.0–17.0)
MCH: 30.3 pg (ref 26.0–34.0)
MCHC: 32.1 g/dL (ref 30.0–36.0)
MCV: 94.2 fL (ref 80.0–100.0)
Platelets: 312 10*3/uL (ref 150–400)
RBC: 4.66 MIL/uL (ref 4.22–5.81)
RDW: 14 % (ref 11.5–15.5)
WBC: 12.9 10*3/uL — ABNORMAL HIGH (ref 4.0–10.5)
nRBC: 0 % (ref 0.0–0.2)

## 2019-04-28 LAB — RESPIRATORY PANEL BY RT PCR (FLU A&B, COVID)
Influenza A by PCR: NEGATIVE
Influenza B by PCR: NEGATIVE
SARS Coronavirus 2 by RT PCR: NEGATIVE

## 2019-04-28 LAB — LACTIC ACID, PLASMA: Lactic Acid, Venous: 3.4 mmol/L (ref 0.5–1.9)

## 2019-04-28 LAB — ETHANOL: Alcohol, Ethyl (B): 177 mg/dL — ABNORMAL HIGH (ref <10)

## 2019-04-28 LAB — PROTIME-INR
INR: 1 (ref 0.8–1.2)
Prothrombin Time: 13.5 seconds (ref 11.4–15.2)

## 2019-04-28 LAB — SAMPLE TO BLOOD BANK

## 2019-04-28 MED ORDER — TETANUS-DIPHTH-ACELL PERTUSSIS 5-2.5-18.5 LF-MCG/0.5 IM SUSP
0.5000 mL | Freq: Once | INTRAMUSCULAR | Status: AC
  Administered 2019-04-28: 0.5 mL via INTRAMUSCULAR
  Filled 2019-04-28: qty 0.5

## 2019-04-28 MED ORDER — THIAMINE HCL 100 MG PO TABS
100.0000 mg | ORAL_TABLET | Freq: Every day | ORAL | Status: DC
  Administered 2019-04-28 – 2019-05-04 (×7): 100 mg via ORAL
  Filled 2019-04-28 (×7): qty 1

## 2019-04-28 MED ORDER — LORAZEPAM 2 MG/ML IJ SOLN: 1.0000 mg | INTRAMUSCULAR | Status: AC | PRN

## 2019-04-28 MED ORDER — LORAZEPAM 1 MG PO TABS: 1.0000 mg | ORAL_TABLET | ORAL | Status: AC | PRN

## 2019-04-28 MED ORDER — METOPROLOL TARTRATE 5 MG/5ML IV SOLN: 5.0000 mg | Freq: Four times a day (QID) | INTRAVENOUS | Status: DC | PRN

## 2019-04-28 MED ORDER — THIAMINE HCL 100 MG/ML IJ SOLN: 100.0000 mg | Freq: Every day | INTRAMUSCULAR | Status: DC

## 2019-04-28 MED ORDER — SODIUM CHLORIDE 0.9 % IV SOLN: INTRAVENOUS | Status: DC

## 2019-04-28 MED ORDER — FOLIC ACID 1 MG PO TABS
1.0000 mg | ORAL_TABLET | Freq: Every day | ORAL | Status: DC
  Administered 2019-04-28 – 2019-05-04 (×7): 1 mg via ORAL
  Filled 2019-04-28 (×7): qty 1

## 2019-04-28 MED ORDER — ADULT MULTIVITAMIN W/MINERALS CH
1.0000 | ORAL_TABLET | Freq: Every day | ORAL | Status: DC
  Administered 2019-04-28 – 2019-05-04 (×7): 1 via ORAL
  Filled 2019-04-28 (×7): qty 1

## 2019-04-28 MED ORDER — ONDANSETRON HCL 4 MG/2ML IJ SOLN: 4.0000 mg | Freq: Four times a day (QID) | INTRAMUSCULAR | Status: DC | PRN

## 2019-04-28 MED ORDER — IOHEXOL 350 MG/ML SOLN
80.0000 mL | Freq: Once | INTRAVENOUS | Status: AC | PRN
  Administered 2019-04-28: 80 mL via INTRAVENOUS

## 2019-04-28 MED ORDER — ONDANSETRON 4 MG PO TBDP: 4.0000 mg | ORAL_TABLET | Freq: Four times a day (QID) | ORAL | Status: DC | PRN

## 2019-04-28 MED ORDER — OXYCODONE HCL 5 MG PO TABS: 10.0000 mg | ORAL_TABLET | ORAL | Status: DC | PRN

## 2019-04-28 MED ORDER — LORAZEPAM 2 MG/ML IJ SOLN
0.0000 mg | Freq: Two times a day (BID) | INTRAMUSCULAR | Status: AC
  Administered 2019-04-30: 2 mg via INTRAVENOUS
  Filled 2019-04-28 (×2): qty 1

## 2019-04-28 MED ORDER — OXYCODONE HCL 5 MG PO TABS
5.0000 mg | ORAL_TABLET | ORAL | Status: DC | PRN
  Administered 2019-04-28: 5 mg via ORAL
  Filled 2019-04-28: qty 1

## 2019-04-28 MED ORDER — FENTANYL CITRATE (PF) 100 MCG/2ML IJ SOLN
50.0000 ug | Freq: Once | INTRAMUSCULAR | Status: AC
  Administered 2019-04-28: 50 ug via INTRAVENOUS
  Filled 2019-04-28: qty 2

## 2019-04-28 MED ORDER — ACETAMINOPHEN 500 MG PO TABS
1000.0000 mg | ORAL_TABLET | Freq: Four times a day (QID) | ORAL | Status: DC
  Administered 2019-04-28 – 2019-05-04 (×20): 1000 mg via ORAL
  Filled 2019-04-28 (×21): qty 2

## 2019-04-28 MED ORDER — LORAZEPAM 2 MG/ML IJ SOLN
0.0000 mg | Freq: Four times a day (QID) | INTRAMUSCULAR | Status: AC
  Administered 2019-04-29: 2 mg via INTRAVENOUS
  Filled 2019-04-28: qty 1

## 2019-04-28 MED ORDER — MORPHINE SULFATE (PF) 2 MG/ML IV SOLN
2.0000 mg | INTRAVENOUS | Status: DC | PRN
  Administered 2019-04-29 – 2019-05-01 (×6): 2 mg via INTRAVENOUS
  Filled 2019-04-28 (×7): qty 1

## 2019-04-28 MED ORDER — HYDROMORPHONE HCL 1 MG/ML IJ SOLN
1.0000 mg | Freq: Once | INTRAMUSCULAR | Status: AC
  Administered 2019-04-28: 1 mg via INTRAVENOUS
  Filled 2019-04-28: qty 1

## 2019-04-28 NOTE — ED Notes (Signed)
Updated pt's wife and explained plan of care. All questions answered

## 2019-04-28 NOTE — Progress Notes (Signed)
Orthopedic Tech Progress Note Patient Details:  Carlos Reynolds 06-Sep-1955 [id number] Behind the back strap is not applied. Patient stated he wanted to wait until he slid over to the hospital bed before applying. Nurse was in the room and agreed he could wait.  Ortho Devices Type of Ortho Device: Shoulder immobilizer Ortho Device/Splint Location: RUE Ortho Device/Splint Interventions: Ordered, Application, Adjustment   Post Interventions Patient Tolerated: Fair Instructions Provided: Care of device   [name] N Konstantin Lehnen 04/28/2019, 10:10 PM

## 2019-04-28 NOTE — ED Notes (Signed)
Carlos Reynolds- wife- [phone]- would like pt updates.

## 2019-04-28 NOTE — ED Notes (Signed)
Vitals still not updating. Awaiting maintaince to fix
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
## 2019-04-28 NOTE — ED Notes (Signed)
Pt switched from NRB to 6L [place]. Tolerating well, O2 at 98%
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
## 2019-04-28 NOTE — Consult Note (Signed)
Responded to initial page, pt unavailable. When checked back, pt asleep and nurse had communicated with wife. Staff will page if further need of chaplain services.  Rev. Donnel Saxon Chaplain

## 2019-04-28 NOTE — ED Notes (Signed)
Vital sign monitor offline and not filing vitals. Request to fix placed

## 2019-04-28 NOTE — ED Notes (Signed)
Updated pt's wife, she spoke to the pt and this Char[employer] fundraiser. All questions answered

## 2019-04-29 ENCOUNTER — Inpatient Hospital Stay (HOSPITAL_COMMUNITY): Payer: Medicaid Other

## 2019-04-29 ENCOUNTER — Other Ambulatory Visit: Payer: Self-pay

## 2019-04-29 ENCOUNTER — Encounter (HOSPITAL_COMMUNITY): Payer: Self-pay

## 2019-04-29 LAB — PHOSPHORUS: Phosphorus: 4.1 mg/dL (ref 2.5–4.6)

## 2019-04-29 LAB — COMPREHENSIVE METABOLIC PANEL
ALT: 1431 U/L — ABNORMAL HIGH (ref 0–44)
AST: 1952 U/L — ABNORMAL HIGH (ref 15–41)
Albumin: 4 g/dL (ref 3.5–5.0)
Alkaline Phosphatase: 63 U/L (ref 38–126)
Anion gap: 14 (ref 5–15)
BUN: 17 mg/dL (ref 8–23)
CO2: 19 mmol/L — ABNORMAL LOW (ref 22–32)
Calcium: 9.2 mg/dL (ref 8.9–10.3)
Chloride: 106 mmol/L (ref 98–111)
Creatinine, Ser: 1.22 mg/dL (ref 0.61–1.24)
GFR calc Af Amer: >60 mL/min (ref >60)
GFR calc non Af Amer: >60 mL/min (ref >60)
Glucose, Bld: 135 mg/dL — ABNORMAL HIGH (ref 70–99)
Potassium: 4.9 mmol/L (ref 3.5–5.1)
Sodium: 139 mmol/L (ref 135–145)
Total Bilirubin: 1.1 mg/dL (ref 0.3–1.2)
Total Protein: 7.2 g/dL (ref 6.5–8.1)

## 2019-04-29 LAB — CBC
HCT: 44.8 % (ref 39.0–52.0)
Hemoglobin: 14.7 g/dL (ref 13.0–17.0)
MCH: 30.2 pg (ref 26.0–34.0)
MCHC: 32.8 g/dL (ref 30.0–36.0)
MCV: 92.2 fL (ref 80.0–100.0)
Platelets: 281 10*3/uL (ref 150–400)
RBC: 4.86 MIL/uL (ref 4.22–5.81)
RDW: 14.2 % (ref 11.5–15.5)
WBC: 12.2 10*3/uL — ABNORMAL HIGH (ref 4.0–10.5)
nRBC: 0 % (ref 0.0–0.2)

## 2019-04-29 LAB — URINALYSIS, ROUTINE W REFLEX MICROSCOPIC
Bilirubin Urine: NEGATIVE
Glucose, UA: NEGATIVE mg/dL
Ketones, ur: NEGATIVE mg/dL
Leukocytes,Ua: NEGATIVE
Nitrite: NEGATIVE
Protein, ur: 30 mg/dL — AB
Specific Gravity, Urine: 1.033 — ABNORMAL HIGH (ref 1.005–1.030)
pH: 5 (ref 5.0–8.0)

## 2019-04-29 LAB — MAGNESIUM: Magnesium: 1.7 mg/dL (ref 1.7–2.4)

## 2019-04-29 MED ORDER — GABAPENTIN 300 MG PO CAPS
300.0000 mg | ORAL_CAPSULE | Freq: Two times a day (BID) | ORAL | Status: DC
  Administered 2019-04-29 – 2019-05-04 (×11): 300 mg via ORAL
  Filled 2019-04-29 (×11): qty 1

## 2019-04-29 MED ORDER — PANTOPRAZOLE SODIUM 40 MG IV SOLR
40.0000 mg | INTRAVENOUS | Status: DC
  Administered 2019-04-29 – 2019-05-01 (×3): 40 mg via INTRAVENOUS
  Filled 2019-04-29 (×3): qty 40

## 2019-04-29 MED ORDER — IPRATROPIUM-ALBUTEROL 0.5-2.5 (3) MG/3ML IN SOLN: 3.0000 mL | Freq: Four times a day (QID) | RESPIRATORY_TRACT | Status: DC | PRN

## 2019-04-29 MED ORDER — OXYCODONE HCL 5 MG PO TABS
5.0000 mg | ORAL_TABLET | ORAL | Status: DC | PRN
  Administered 2019-04-29 (×3): 5 mg via ORAL
  Administered 2019-04-30 – 2019-05-04 (×10): 10 mg via ORAL
  Filled 2019-04-29 (×3): qty 2
  Filled 2019-04-29 (×2): qty 1
  Filled 2019-04-29: qty 2
  Filled 2019-04-29: qty 1
  Filled 2019-04-29 (×7): qty 2

## 2019-04-29 MED ORDER — HYDROCHLOROTHIAZIDE 25 MG PO TABS
25.0000 mg | ORAL_TABLET | Freq: Every day | ORAL | Status: DC
  Administered 2019-04-29 – 2019-05-04 (×6): 25 mg via ORAL
  Filled 2019-04-29 (×6): qty 1

## 2019-04-29 MED ORDER — IPRATROPIUM-ALBUTEROL 0.5-2.5 (3) MG/3ML IN SOLN
3.0000 mL | Freq: Once | RESPIRATORY_TRACT | Status: AC
  Administered 2019-04-29: 3 mL via RESPIRATORY_TRACT
  Filled 2019-04-29: qty 3

## 2019-04-29 NOTE — ED Notes (Signed)
Hooked patient up to the cardiac monitor patient is resting with call bell in reach 
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
## 2019-04-29 NOTE — ED Notes (Signed)
In and out cathed pt due to him not urinating since arrival. 400 ml obtained and UA sent

## 2019-04-29 NOTE — Plan of Care (Signed)
New admission

## 2019-04-29 NOTE — ED Notes (Signed)
Patient moved to hospital bed for comfort 

## 2019-04-29 NOTE — ED Notes (Signed)
Lunch Tray Ordered @ 1054. 

## 2019-04-29 NOTE — ED Notes (Signed)
Patient's wife called and update given.

## 2019-04-30 LAB — COMPREHENSIVE METABOLIC PANEL
ALT: 1390 U/L — ABNORMAL HIGH (ref 0–44)
AST: 1164 U/L — ABNORMAL HIGH (ref 15–41)
Albumin: 3.5 g/dL (ref 3.5–5.0)
Alkaline Phosphatase: 83 U/L (ref 38–126)
Anion gap: 11 (ref 5–15)
BUN: 24 mg/dL — ABNORMAL HIGH (ref 8–23)
CO2: 24 mmol/L (ref 22–32)
Calcium: 8.9 mg/dL (ref 8.9–10.3)
Chloride: 103 mmol/L (ref 98–111)
Creatinine, Ser: 1.27 mg/dL — ABNORMAL HIGH (ref 0.61–1.24)
GFR calc Af Amer: >60 mL/min (ref >60)
GFR calc non Af Amer: 60 mL/min — ABNORMAL LOW (ref >60)
Glucose, Bld: 113 mg/dL — ABNORMAL HIGH (ref 70–99)
Potassium: 4 mmol/L (ref 3.5–5.1)
Sodium: 138 mmol/L (ref 135–145)
Total Bilirubin: 1.1 mg/dL (ref 0.3–1.2)
Total Protein: 6.6 g/dL (ref 6.5–8.1)

## 2019-04-30 LAB — CBC
HCT: 41.6 % (ref 39.0–52.0)
Hemoglobin: 13.7 g/dL (ref 13.0–17.0)
MCH: 30 pg (ref 26.0–34.0)
MCHC: 32.9 g/dL (ref 30.0–36.0)
MCV: 91.2 fL (ref 80.0–100.0)
Platelets: 232 10*3/uL (ref 150–400)
RBC: 4.56 MIL/uL (ref 4.22–5.81)
RDW: 14.3 % (ref 11.5–15.5)
WBC: 12.2 10*3/uL — ABNORMAL HIGH (ref 4.0–10.5)
nRBC: 0 % (ref 0.0–0.2)

## 2019-04-30 MED ORDER — IPRATROPIUM-ALBUTEROL 0.5-2.5 (3) MG/3ML IN SOLN
3.0000 mL | Freq: Four times a day (QID) | RESPIRATORY_TRACT | Status: DC
  Administered 2019-04-30 (×3): 3 mL via RESPIRATORY_TRACT
  Filled 2019-04-30 (×3): qty 3

## 2019-04-30 MED ORDER — GUAIFENESIN ER 600 MG PO TB12
600.0000 mg | ORAL_TABLET | Freq: Two times a day (BID) | ORAL | Status: DC
  Administered 2019-04-30 – 2019-05-04 (×9): 600 mg via ORAL
  Filled 2019-04-30 (×9): qty 1

## 2019-04-30 MED ORDER — IPRATROPIUM-ALBUTEROL 0.5-2.5 (3) MG/3ML IN SOLN: 3.0000 mL | Freq: Three times a day (TID) | RESPIRATORY_TRACT | Status: DC
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
## 2019-04-30 NOTE — Progress Notes (Addendum)
Informed by tele that patient had 7 beats of Vtach @1235 , assessed patient and he is currently working with PT and transferred from bed to chair, pt also continues to complain of right rib pain. Notified MD so they are aware of this. Will re-assess him again once pain is addressed and patient sitting in chair.

## 2019-04-30 NOTE — Progress Notes (Signed)
Notified by tele pt has 22 runs of SVT, rate up to mid 190's, assessed pt he was asleep at this time. EKG ordered.
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
## 2019-04-30 NOTE — Progress Notes (Signed)
Re-assessed patient, he is sitting in chair grunting in pain from his R rib area, Morphine 1mg  IV given, will re-evaluate for effectiveness. Confirmed with tele, pt goes back and forth with sinus tach and sinus rhythm. Will continue to monitor.
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
## 2019-05-01 LAB — CBC
HCT: 40.6 % (ref 39.0–52.0)
Hemoglobin: 13.3 g/dL (ref 13.0–17.0)
MCH: 30 pg (ref 26.0–34.0)
MCHC: 32.8 g/dL (ref 30.0–36.0)
MCV: 91.4 fL (ref 80.0–100.0)
Platelets: 207 10*3/uL (ref 150–400)
RBC: 4.44 MIL/uL (ref 4.22–5.81)
RDW: 13.8 % (ref 11.5–15.5)
WBC: 10.2 10*3/uL (ref 4.0–10.5)
nRBC: 0 % (ref 0.0–0.2)

## 2019-05-01 LAB — COMPREHENSIVE METABOLIC PANEL
ALT: 830 U/L — ABNORMAL HIGH (ref 0–44)
AST: 342 U/L — ABNORMAL HIGH (ref 15–41)
Albumin: 3.1 g/dL — ABNORMAL LOW (ref 3.5–5.0)
Alkaline Phosphatase: 100 U/L (ref 38–126)
Anion gap: 12 (ref 5–15)
BUN: 27 mg/dL — ABNORMAL HIGH (ref 8–23)
CO2: 26 mmol/L (ref 22–32)
Calcium: 8.7 mg/dL — ABNORMAL LOW (ref 8.9–10.3)
Chloride: 97 mmol/L — ABNORMAL LOW (ref 98–111)
Creatinine, Ser: 1.35 mg/dL — ABNORMAL HIGH (ref 0.61–1.24)
GFR calc Af Amer: >60 mL/min (ref >60)
GFR calc non Af Amer: 55 mL/min — ABNORMAL LOW (ref >60)
Glucose, Bld: 106 mg/dL — ABNORMAL HIGH (ref 70–99)
Potassium: 3.7 mmol/L (ref 3.5–5.1)
Sodium: 135 mmol/L (ref 135–145)
Total Bilirubin: 1.4 mg/dL — ABNORMAL HIGH (ref 0.3–1.2)
Total Protein: 6.6 g/dL (ref 6.5–8.1)

## 2019-05-01 MED ORDER — BISACODYL 10 MG RE SUPP
10.0000 mg | Freq: Every day | RECTAL | Status: DC | PRN
  Filled 2019-05-01: qty 1

## 2019-05-01 MED ORDER — METOPROLOL TARTRATE 12.5 MG HALF TABLET
12.5000 mg | ORAL_TABLET | Freq: Two times a day (BID) | ORAL | Status: DC
  Administered 2019-05-01 – 2019-05-04 (×7): 12.5 mg via ORAL
  Filled 2019-05-01 (×7): qty 1

## 2019-05-01 MED ORDER — PANTOPRAZOLE SODIUM 40 MG PO TBEC
40.0000 mg | DELAYED_RELEASE_TABLET | Freq: Every day | ORAL | Status: DC
  Administered 2019-05-02 – 2019-05-04 (×3): 40 mg via ORAL
  Filled 2019-05-01 (×3): qty 1

## 2019-05-01 MED ORDER — IPRATROPIUM-ALBUTEROL 0.5-2.5 (3) MG/3ML IN SOLN
3.0000 mL | Freq: Four times a day (QID) | RESPIRATORY_TRACT | Status: DC | PRN
  Administered 2019-05-01: 3 mL via RESPIRATORY_TRACT
  Filled 2019-05-01: qty 3

## 2019-05-01 MED ORDER — ENOXAPARIN SODIUM 40 MG/0.4ML ~~LOC~~ SOLN
40.0000 mg | SUBCUTANEOUS | Status: DC
  Administered 2019-05-01 – 2019-05-04 (×4): 40 mg via SUBCUTANEOUS
  Filled 2019-05-01 (×4): qty 0.4

## 2019-05-01 MED ORDER — POLYETHYLENE GLYCOL 3350 17 G PO PACK
17.0000 g | PACK | Freq: Every day | ORAL | Status: DC
  Administered 2019-05-01 – 2019-05-03 (×3): 17 g via ORAL
  Filled 2019-05-01 (×4): qty 1

## 2019-05-01 MED ORDER — DOCUSATE SODIUM 100 MG PO CAPS
100.0000 mg | ORAL_CAPSULE | Freq: Two times a day (BID) | ORAL | Status: DC
  Administered 2019-05-01 – 2019-05-03 (×6): 100 mg via ORAL
  Filled 2019-05-01 (×7): qty 1

## 2019-05-01 MED ORDER — SODIUM CHLORIDE 0.9 % IV SOLN: INTRAVENOUS | Status: DC

## 2019-05-01 NOTE — Progress Notes (Signed)
Notified Derrell Lolling of tele calling for 23 beats of SVT and the pt getting up to the 190s. Pt is sleeping. No distress noted. Will continue to monitor.
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
## 2019-05-01 NOTE — Progress Notes (Signed)
Tele called @ 2108 and 2136 to notify that pts HR got up to 125 twice, but then went back down to 80s-90s. Checked on pt. Pt states no issues, and no signs of distress were noted. Will continue to monitor.
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
## 2019-05-01 NOTE — Plan of Care (Addendum)
  Problem: Clinical Measurements: Goal: Respiratory complications will improve Note: Pt using incentive spirometer to cough and deep breathe and flutter valve, encourage to use it every hour while awake, pt also still requiring 3L of oxygen and remains SOB and tachynpic, was not able to wean to room air at this time, PRN duo neb given which provides relief per patient. Will continue to monitor respiratory status   Problem: Activity: Goal: Risk for activity intolerance will decrease Note: Pt hesitant to get out of bed, able to sit in chair with PT, he was able to sit up for one hour then was eager to get back to bed, sling placed to R arm for comfort, RN and PT educated pt on importance of getting out of bed with assistance for adequate lung expansion and to help with his breathing. Pt verbalized understanding but still needs encouragement.    Problem: Elimination: Goal: Will not experience complications related to bowel motility Note: Pt has not had a bowel movement since admission, he has scheduled stool softener and laxative however refused to take an suppository. Will continue to assess bowel pattern.

## 2019-05-01 NOTE — Progress Notes (Signed)
Contacted respiratory for a breathing treatment per pt request.

## 2019-05-02 LAB — COMPREHENSIVE METABOLIC PANEL
ALT: 549 U/L — ABNORMAL HIGH (ref 0–44)
AST: 164 U/L — ABNORMAL HIGH (ref 15–41)
Albumin: 2.8 g/dL — ABNORMAL LOW (ref 3.5–5.0)
Alkaline Phosphatase: 107 U/L (ref 38–126)
Anion gap: 13 (ref 5–15)
BUN: 27 mg/dL — ABNORMAL HIGH (ref 8–23)
CO2: 25 mmol/L (ref 22–32)
Calcium: 8.5 mg/dL — ABNORMAL LOW (ref 8.9–10.3)
Chloride: 97 mmol/L — ABNORMAL LOW (ref 98–111)
Creatinine, Ser: 1.17 mg/dL (ref 0.61–1.24)
GFR calc Af Amer: >60 mL/min (ref >60)
GFR calc non Af Amer: >60 mL/min (ref >60)
Glucose, Bld: 94 mg/dL (ref 70–99)
Potassium: 3.9 mmol/L (ref 3.5–5.1)
Sodium: 135 mmol/L (ref 135–145)
Total Bilirubin: 1.1 mg/dL (ref 0.3–1.2)
Total Protein: 6.2 g/dL — ABNORMAL LOW (ref 6.5–8.1)

## 2019-05-02 LAB — CBC
HCT: 41.8 % (ref 39.0–52.0)
Hemoglobin: 13.6 g/dL (ref 13.0–17.0)
MCH: 30.2 pg (ref 26.0–34.0)
MCHC: 32.5 g/dL (ref 30.0–36.0)
MCV: 92.7 fL (ref 80.0–100.0)
Platelets: 235 10*3/uL (ref 150–400)
RBC: 4.51 MIL/uL (ref 4.22–5.81)
RDW: 13.9 % (ref 11.5–15.5)
WBC: 10.2 10*3/uL (ref 4.0–10.5)
nRBC: 0 % (ref 0.0–0.2)

## 2019-05-02 LAB — MRSA PCR SCREENING: MRSA by PCR: NEGATIVE

## 2019-05-02 LAB — MAGNESIUM: Magnesium: 2.3 mg/dL (ref 1.7–2.4)

## 2019-05-02 MED ORDER — IPRATROPIUM-ALBUTEROL 0.5-2.5 (3) MG/3ML IN SOLN
3.0000 mL | Freq: Four times a day (QID) | RESPIRATORY_TRACT | Status: DC
  Administered 2019-05-02 – 2019-05-03 (×4): 3 mL via RESPIRATORY_TRACT
  Filled 2019-05-02 (×4): qty 3
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
## 2019-05-03 MED ORDER — IPRATROPIUM-ALBUTEROL 0.5-2.5 (3) MG/3ML IN SOLN
3.0000 mL | RESPIRATORY_TRACT | Status: DC | PRN
  Administered 2019-05-03 – 2019-05-04 (×2): 3 mL via RESPIRATORY_TRACT
  Filled 2019-05-03 (×3): qty 3
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
## 2019-05-03 NOTE — TOC Progression Note (Signed)
Transition of Care [facility]) - Progression Note    Patient Details  Name: Schawn Byas MRN: [id number] Date of Birth: 01/26/1956  Transition of Care [facility]) CM/SW Contact  Glennon Mac, RN Phone Number: 05/03/2019, 12:23 PM  Clinical Narrative: Pt progressing with therapies; still requiring oxygen currently --attempting to wean off.  PT/OT recommending HH follow up, and pt agreeable to home services.  Referral to [facility] for [facility] follow up; referral to [facility] for recommended DME.  RW and 3 in 1 to be delivered to bedside prior to dc.        Expected Discharge Plan: Home w Home Health Services Barriers to Discharge: Continued Medical Work up  Expected Discharge Plan and Services Expected Discharge Plan: Home w Home Health Services   Discharge Planning Services: CM Consult Post Acute Care Choice: Home Health Living arrangements for the past 2 months: Single Family Home                 DME Arranged: 3-N-1, Walker rolling DME Agency: [facility] Date DME Agency Contacted: 05/03/19 Time DME Agency Contacted: 1220 Representative spoke with at DME Agency: Oletha Cruel HH Arranged: PT, OT HH Agency: [facility] Date [facility] Agency Contacted: 05/03/19 Time HH Agency Contacted: 1221 Representative spoke with at [facility] Agency: Bonnye Fava   Social Determinants of Health (SDOH) Interventions    Readmission Risk Interventions Readmission Risk Prevention Plan 05/03/2019  Post Dischage Appt Complete  Medication Screening Complete  Transportation Screening Complete    Quintella Baton, RN, BSN  Trauma/Neuro ICU Case Manager [phone]

## 2019-05-04 MED ORDER — OXYCODONE HCL 5 MG PO TABS: 5.0000 mg | ORAL_TABLET | Freq: Four times a day (QID) | ORAL | 0 refills | Status: DC | PRN

## 2019-05-04 MED ORDER — ALBUTEROL SULFATE HFA 108 (90 BASE) MCG/ACT IN AERS: 1.0000 | INHALATION_SPRAY | Freq: Four times a day (QID) | RESPIRATORY_TRACT | 0 refills | Status: AC | PRN
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
## 2019-05-04 NOTE — Plan of Care (Signed)
Patient is well educated on oxygen therapy at home and will follow up with doctor post discharge. Patient is going home with wife today.

## 2019-05-04 NOTE — TOC Transition Note (Addendum)
Transition of Care [facility]) - CM/SW Discharge Note   Patient Details  Name: Carlos Reynolds MRN: [id number] Date of Birth: 03/23/1955  Transition of Care [facility]) CM/SW Contact:  Glennon Mac, RN Phone Number: 05/04/2019, 2:06 PM   Clinical Narrative:  Pt medically stable for discharge home today with wife and [facility] services as arranged.  Pt will need home oxygen set up, as continues to desaturate on room air.  Referral to [employer] for home O2 set up.  Portable oxygen to be delivered to pt's room prior to discharge home.  3 in 1 and RW already has been delivered by [facility].     Notified by [facility] that portable oxygen tank will be delivered to room by 5pm.      Final next level of care: Home w Home Health Services Barriers to Discharge: Barriers Resolved   Patient Goals and CMS Choice Patient states their goals for this hospitalization and ongoing recovery are:: to feel better CMS Medicare.gov Compare Post Acute Care list provided to:: Patient Choice offered to / list presented to : Patient                      Discharge Plan and Services   Discharge Planning Services: CM Consult Post Acute Care Choice: Home Health          DME Arranged: Oxygen DME Agency: [facility] Date DME Agency Contacted: 05/04/19 Time DME Agency Contacted: 1406 Representative spoke with at DME Agency: Verlon Au with [facility] HH Arranged: PT, OT [facility] Agency: [facility] Date [facility] Agency Contacted: 05/03/19 Time HH Agency Contacted: 1221 Representative spoke with at [facility] Agency: Bonnye Fava  Social Determinants of Health (SDOH) Interventions     Readmission Risk Interventions Readmission Risk Prevention Plan 05/03/2019  Post Dischage Appt Complete  Medication Screening Complete  Transportation Screening Complete   Quintella Baton, RN, BSN  Trauma/Neuro ICU Case Manager [phone]

## 2019-05-04 NOTE — Progress Notes (Signed)
SATURATION QUALIFICATIONS: (This note is used to comply with regulatory documentation for home oxygen)  Patient Saturations on Room Air at Rest = 94%  Patient Saturations on Room Air while Ambulating = 81%  Patient Saturations on 2 Liters of oxygen while Ambulating = 92%  Please briefly explain why patient needs home oxygen:  To maintain oxygen saturation >87% during functional activities.   Jerolyn Center, PT Pager [phone]

## 2019-05-04 NOTE — Discharge Instructions (Signed)
RIB FRACTURES  HOME INSTRUCTIONS   1. PAIN CONTROL:  1. Pain is best controlled by a usual combination of three different methods TOGETHER:  i. Ice/Heat ii. Over the counter pain medication iii. Prescription pain medication 2. You may experience some swelling and bruising in area of broken ribs. Ice packs or heating pads (30-60 minutes up to 6 times a day) will help. Use ice for the first few days to help decrease swelling and bruising, then switch to heat to help relax tight/sore spots and speed recovery. Some people prefer to use ice alone, heat alone, alternating between ice & heat. Experiment to what works for you. Swelling and bruising can take several weeks to resolve.  3. It is helpful to take an over-the-counter pain medication regularly for the first few weeks. Choose one of the following that works best for you:  i. Naproxen (Aleve, etc) Two 220mg  tabs twice a day ii. Ibuprofen (Advil, etc) Three 200mg  tabs four times a day (every meal & bedtime) iii. Acetaminophen (Tylenol, etc) 500-650mg  four times a day (every meal & bedtime) 4. A prescription for pain medication (such as oxycodone, hydrocodone, etc) may be given to you upon discharge. Take your pain medication as prescribed.  i. If you are having problems/concerns with the prescription medicine (does not control pain, nausea, vomiting, rash, itching, etc), please call us [phone] to see if we need to switch you to a different pain medicine that will work better for you and/or control your side effect better. ii. If you need a refill on your pain medication, please contact your pharmacy. They will contact our office to request authorization. Prescriptions will not be filled after 5 pm or on week-ends. 1. Avoid getting constipated. When taking pain medications, it is common to experience some constipation. Increasing fluid intake and taking a fiber supplement (such as Metamucil, Citrucel, FiberCon, MiraLax, etc) 1-2 times a day  regularly will usually help prevent this problem from occurring. A mild laxative (prune juice, Milk of Magnesia, MiraLax, etc) should be taken according to package directions if there are no bowel movements after 48 hours.  2. Watch out for diarrhea. If you have many loose bowel movements, simplify your diet to bland foods & liquids for a few days. Stop any stool softeners and decrease your fiber supplement. Switching to mild anti-diarrheal medications (Kayopectate, Pepto Bismol) can help. If this worsens or does not improve, please call us. 3. FOLLOW UP  a. If a follow up appointment is needed one will be scheduled for you. If none is needed with our trauma team, please follow up with your primary care provider within 2-3 weeks from discharge. Please call [facility] at [phone] if you have any questions about follow up.  b. If you have any orthopedic or other injuries you will need to follow up as outlined in your follow up instructions.   WHEN TO CALL us [phone]:  1. Poor pain control 2. Reactions / problems with new medications (rash/itching, nausea, etc)  3. Fever over 101.5 F (38.5 C) 4. Worsening swelling or bruising 5. Worsening pain, productive cough, difficulty breathing or any other concerning symptoms  The clinic staff is available to answer your questions during regular business hours (8:30am-5pm). Please don't hesitate to call and ask to speak to one of our nurses for clinical concerns.  If you have a medical emergency, go to the nearest emergency room or call 911.  A surgeon from [facility] is always on call  at the hospitals   [facility]  [address], [address], [place],  [address] ?  MAIN: [phone] ? TOLL FREE: [phone] ?  FAX [phone]  [email or web address]      Information on Rib Fractures  A rib fracture is a break or crack in one of the bones of the ribs. The ribs are long, curved bones that  wrap around your chest and attach to your spine and your breastbone. The ribs protect your heart, lungs, and other organs in the chest. A broken or cracked rib is often painful but is not usually serious. Most rib fractures heal on their own over time. However, rib fractures can be more serious if multiple ribs are broken or if broken ribs move out of place and push against other structures or organs. What are the causes? This condition is caused by:  Repetitive movements with high force, such as pitching a baseball or having severe coughing spells.  A direct blow to the chest, such as a sports injury, a car accident, or a fall.  Cancer that has spread to the bones, which can weaken bones and cause them to break. What are the signs or symptoms? Symptoms of this condition include:  Pain when you breathe in or cough.  Pain when someone presses on the injured area.  Feeling short of breath. How is this diagnosed? This condition is diagnosed with a physical exam and medical history. Imaging tests may also be done, such as:  Chest X-ray.  CT scan.  MRI.  Bone scan.  Chest ultrasound. How is this treated? Treatment for this condition depends on the severity of the fracture. Most rib fractures usually heal on their own in 1-3 months. Sometimes healing takes longer if there is a cough that does not stop or if there are other activities that make the injury worse (aggravating factors). While you heal, you will be given medicines to control the pain. You will also be taught deep breathing exercises. Severe injuries may require hospitalization or surgery. Follow these instructions at home: Managing pain, stiffness, and swelling  If directed, apply ice to the injured area. ? Put ice in a plastic bag. ? Place a towel between your skin and the bag. ? Leave the ice on for 20 minutes, 2-3 times a day.  Take over-the-counter and prescription medicines only as told by your health care  provider. Activity  Avoid a lot of activity and any activities or movements that cause pain. Be careful during activities and avoid bumping the injured rib.  Slowly increase your activity as told by your health care provider. General instructions  Do deep breathing exercises as told by your health care provider. This helps prevent pneumonia, which is a common complication of a broken rib. Your health care provider may instruct you to: ? Take deep breaths several times a day. ? Try to cough several times a day, holding a pillow against the injured area. ? Use a device called incentive spirometer to practice deep breathing several times a day.  Drink enough fluid to keep your urine pale yellow.  Do not wear a rib belt or binder. These restrict breathing, which can lead to pneumonia.  Keep all follow-up visits as told by your health care provider. This is important. Contact a health care provider if:  You have a fever. Get help right away if:  You have difficulty breathing or you are short of breath.  You develop a cough that does   not stop, or you cough up thick or bloody sputum.  You have nausea, vomiting, or pain in your abdomen.  Your pain gets worse and medicine does not help. Summary  A rib fracture is a break or crack in one of the bones of the ribs.  A broken or cracked rib is often painful but is not usually serious.  Most rib fractures heal on their own over time.  Treatment for this condition depends on the severity of the fracture.  Avoid a lot of activity and any activities or movements that cause pain. This information is not intended to replace advice given to you by your health care provider. Make sure you discuss any questions you have with your health care provider. Document Released: 02/11/2005 Document Revised: 05/13/2016 Document Reviewed: 05/13/2016 Elsevier Interactive Patient Education  2019 [place] Oxygen Use, Adult When a medical  condition keeps you from getting enough oxygen, your health care provider may instruct you to take extra oxygen at home. Your health care provider will let you know:  When to take oxygen.  For how long to take oxygen.  How quickly oxygen should be delivered (flow rate), in liters per minute (LPM or L/M). Home oxygen can be given through:  A mask.  A nasal cannula. This is a device or tube that goes in the nostrils.  A transtracheal catheter. This is a small, flexible tube placed in the trachea.  A tracheostomy. This is a surgically made opening in the trachea. These devices are connected with tubing to an oxygen source, such as:  A tank. Tanks hold oxygen in gas form. They must be replaced when the oxygen is used up.  A liquid oxygen device. This holds oxygen in liquid form. It must be replaced when the oxygen is used up.  An oxygen concentrator machine. This filters oxygen in the room. It uses electricity, so you must have a backup cylinder of oxygen in case the power goes out. Supplies needed: To use oxygen, you will need:  A mask, nasal cannula, transtracheal catheter, or tracheostomy.  An oxygen tank, a liquid oxygen device, or an oxygen concentrator.  The tape that your health care provider recommends (optional). If you use a transtracheal catheter and your prescribed flow rate is 1 LPM or greater, you will also need a humidifier. Risks and complications  Fire. This can happen if the oxygen is exposed to a heat source, flame, or spark.  Injury to skin. This can happen if liquid oxygen touches your skin.  Organ damage. This can happen if you get too little oxygen. How to use oxygen Your health care provider or a representative from your [place] will show you how to use your oxygen device. Follow her or his instructions. The instructions may look something like this: 1. Wash your hands. 2. If you use an oxygen concentrator, make sure it is plugged  in. 3. Place one end of the tube into the port on the tank, device, or machine. 4. Place the mask over your nose and mouth. Or, place the nasal cannula and secure it with tape if instructed. If you use a tracheostomy or transtracheal catheter, connect it to the oxygen source as directed. 5. Make sure the liter-flow setting on the machine is at the level prescribed by your health care provider. 6. Turn on the machine or adjust the knob on the tank or device to the correct liter-flow setting. 7. When you are done, turn off  and unplug the machine, or turn the knob to OFF. How to clean and care for the oxygen supplies Nasal cannula  Clean it with a warm, wet cloth daily or as needed.  Wash it with a liquid soap once a week.  Rinse it thoroughly once or twice a week.  Replace it every 2-4 weeks.  If you have an infection, such as a cold or pneumonia, change the cannula when you get better. Mask  Replace it every 2-4 weeks.  If you have an infection, such as a cold or pneumonia, change the mask when you get better. Humidifier bottle  Wash the bottle between each refill: ? Wash it with soap and warm water. ? Rinse it thoroughly. ? Disinfect it and its top. ? Air-dry it.  Make sure it is dry before you refill it. Oxygen concentrator  Clean the air filter at least twice a week according to directions from your home medical equipment and service company.  Wipe down the cabinet every day. To do this: ? Unplug the unit. ? Wipe down the cabinet with a damp cloth. ? Dry the cabinet. Other equipment  Change any extra tubing every 1-3 months.  Follow instructions from your health care provider about taking care of any other equipment. Safety tips Fire safety tips   Keep your oxygen and oxygen supplies at least 5 ft away from sources of heat, flames, and sparks at all times.  Do not allow smoking near your oxygen. Put up "no smoking" signs in your home. Avoid smoking areas when in  public.  Do not use materials that can burn (are flammable) while you use oxygen.  When you go to a restaurant with portable oxygen, ask to be seated in the nonsmoking section.  Keep a Government social research officer close by. Let your fire department know that you have oxygen in your home.  Test your home smoke detectors regularly. Traveling  Secure your oxygen tank in the vehicle so that it does not move around. Follow instructions from your medical device company about how to safely secure your tank.  Make sure you have enough oxygen for the amount of time you will be away from home.  If you are planning air travel, contact the airline to find out if they allow the use of an approved portable oxygen concentrator. You may also need documents from your health care provider and medical device company before you travel. General safety tips  If you use an oxygen cylinder, make sure it is in a stand or secured to an object that will not move (fixed object).  If you use liquid oxygen, make sure its container is kept upright.  If you use an oxygen concentrator: ? Catering manager company. Make sure you are given priority service in the event that your power goes out. ? Avoid using extension cords, if possible. Follow these instructions at home:  Use oxygen only as told by your health care provider.  Do not use alcohol or other drugs that make you relax (sedating drugs) unless instructed. They can slow down your breathing rate and make it hard to get in enough oxygen.  Know how and when to order a refill of oxygen.  Always keep a spare tank of oxygen. Plan ahead for holidays when you may not be able to get a prescription filled.  Use water-based lubricants on your lips or nostrils. Do not use oil-based products like petroleum jelly.  To prevent skin irritation on your cheeks or behind  your ears, tuck some gauze under the tubing. Contact a health care provider if:  You get headaches  often.  You have shortness of breath.  You have a lasting cough.  You have anxiety.  You are sleepy all the time.  You develop an illness that affects your breathing.  You cannot exercise at your regular level.  You are restless.  You have difficult or irregular breathing, and it is getting worse.  You have a fever.  You have persistent redness under your nose. Get help right away if:  You are confused.  You have blue lips or fingernails.  You are struggling to breathe. Summary  Your health care provider or a representative from your medical device company will show you how to use your oxygen device. Follow her or his instructions.  If you use an oxygen concentrator, make sure it is plugged in.  Make sure the liter-flow setting on the machine is at the level prescribed by your health care provider.  Keep your oxygen and oxygen supplies at least 5 ft away from sources of heat, flames, and sparks at all times. This information is not intended to replace advice given to you by your health care provider. Make sure you discuss any questions you have with your health care provider. Document Revised: 07/31/2017 Document Reviewed: 09/05/2015 Elsevier Patient Education  2020 Elsevier Inc.   Liver Laceration  A liver laceration is a tear or a cut in the liver. The liver is an organ that is involved in many important bodily functions. Sometimes, a liver laceration can be a very serious injury. It can cause a lot of bleeding, and surgery may be needed. Other times, a liver laceration may be minor, and bed rest may be all that is needed. Either way, treatment in a hospital is almost always required. Liver lacerations are categorized in grades from 1 to 5. Low numbers identify lacerations that are less severe than lacerations with high numbers.  Grade 1: This is a tear in the outer lining of the liver. It is less than  inch (1 cm) deep.  Grade 2: This is a tear that is about   inch to 1 inch (1 to 3 cm) deep. It is less than 4 inches (10 cm) long.  Grade 3: This is a tear that is slightly more than 1 inch (3 cm) deep.  Grades 4 and 5: These lacerations are very deep. They affect a large part of the liver. What are the causes? This condition may be caused by:  A forceful hit to the area around the liver (blunt trauma), such as in a car crash. Blunt trauma can tear the liver even though it does not break the skin.  An injury in which an object goes through the skin and into the liver (penetrating injury), such as a stab or gunshot wound. What are the signs or symptoms? Common symptoms of this condition include:  A swollen and firm abdomen.  Pain in the abdomen.  Tenderness when pressing on the right side of the abdomen. Other symptoms include:  Bleeding from a penetrating wound.  Bruises on the abdomen.  A fast heartbeat.  Taking quick breaths.  Feeling weak and dizzy. How is this diagnosed? To diagnose this condition, your health care provider will do a physical exam and ask about any injuries to the right side of your abdomen. You may have various tests, such as:  Blood tests. Your blood may be tested every few hours. This  will show whether you are losing blood.  CT scan. This test is done to check for laceration or bleeding.  Laparoscopy. This involves placing a small camera into the abdomen and looking directly at the surface of the liver. How is this treated? Treatment depends on how deep the laceration is and how much bleeding you have. Treatment options include:  Monitoring and bed rest at the hospital. You will have tests often.  Receiving donated blood through an IV tube (transfusion) to replace blood that you have lost. You may need several transfusions.  Surgery to pack gauze pads or special material around the laceration to help it heal or to repair the laceration. Follow these instructions at home:  Take over-the-counter and  prescription medicines only as told by your health care provider. Do not take any other medicines unless you ask your health care provider about them first.  Do not drive or use heavy machinery while taking prescription pain medicines.  Rest and limit your activity as told by your health care provider. It may be several months before you can return to your usual routine. Do not participate in activities that involve physical contact or require extra energy until your health care provider approves.  Keep all follow-up visits as told by your health care provider. This is important. Contact a health care provider if:  Your abdominal pain does not go away.  You feel more weak and tired than usual. Get help right away if:  Your abdominal pain gets worse.  You have a cut on your skin that: ? Has more redness, swelling, or pain around it. ? Has more fluid or blood coming from it. ? Feels warm to the touch. ? Has pus or a bad smell coming from it.  You feel dizzy or very weak.  You have trouble breathing.  You have a fever. This information is not intended to replace advice given to you by your health care provider. Make sure you discuss any questions you have with your health care provider. Document Revised: 01/24/2017 Document Reviewed: 09/29/2015 Elsevier Patient Education  2020 [employer].
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
## 2019-05-05 ENCOUNTER — Encounter: Payer: Self-pay | Admitting: Student in an Organized Health Care Education/Training Program

## 2019-08-23 NOTE — Progress Notes (Signed)
Certified Letter  

## 2020-08-15 ENCOUNTER — Encounter (HOSPITAL_COMMUNITY): Payer: Self-pay | Admitting: Emergency Medicine

## 2020-08-15 ENCOUNTER — Emergency Department (HOSPITAL_COMMUNITY): Payer: Medicaid Other

## 2020-08-15 ENCOUNTER — Emergency Department (HOSPITAL_COMMUNITY)
Admission: EM | Admit: 2020-08-15 | Discharge: 2020-08-16 | Disposition: A | Discharge status: Home / Self Care | Payer: Medicaid Other | Attending: Emergency Medicine | Admitting: Emergency Medicine

## 2020-08-15 DIAGNOSIS — R0781 Pleurodynia: Secondary | ICD-10-CM | POA: Diagnosis not present

## 2020-08-15 DIAGNOSIS — Z79899 Other long term (current) drug therapy: Secondary | ICD-10-CM | POA: Insufficient documentation

## 2020-08-15 DIAGNOSIS — F17210 Nicotine dependence, cigarettes, uncomplicated: Secondary | ICD-10-CM | POA: Diagnosis not present

## 2020-08-15 DIAGNOSIS — R0602 Shortness of breath: Secondary | ICD-10-CM | POA: Diagnosis present

## 2020-08-15 DIAGNOSIS — I10 Essential (primary) hypertension: Secondary | ICD-10-CM | POA: Diagnosis not present

## 2020-08-15 DIAGNOSIS — R001 Bradycardia, unspecified: Secondary | ICD-10-CM | POA: Insufficient documentation

## 2020-08-15 LAB — CBC WITH DIFFERENTIAL/PLATELET
Abs Immature Granulocytes: 0.03 10*3/uL (ref 0.00–0.07)
Basophils Absolute: 0 10*3/uL (ref 0.0–0.1)
Basophils Relative: 0 %
Eosinophils Absolute: 0.2 10*3/uL (ref 0.0–0.5)
Eosinophils Relative: 4 %
HCT: 41.4 % (ref 39.0–52.0)
Hemoglobin: 13.7 g/dL (ref 13.0–17.0)
Immature Granulocytes: 1 %
Lymphocytes Relative: 38 %
Lymphs Abs: 2.1 10*3/uL (ref 0.7–4.0)
MCH: 30.7 pg (ref 26.0–34.0)
MCHC: 33.1 g/dL (ref 30.0–36.0)
MCV: 92.8 fL (ref 80.0–100.0)
Monocytes Absolute: 0.5 10*3/uL (ref 0.1–1.0)
Monocytes Relative: 10 %
Neutro Abs: 2.5 10*3/uL (ref 1.7–7.7)
Neutrophils Relative %: 47 %
Platelets: 262 10*3/uL (ref 150–400)
RBC: 4.46 MIL/uL (ref 4.22–5.81)
RDW: 14.1 % (ref 11.5–15.5)
WBC: 5.4 10*3/uL (ref 4.0–10.5)
nRBC: 0 % (ref 0.0–0.2)

## 2020-08-15 LAB — COMPREHENSIVE METABOLIC PANEL
ALT: 34 U/L (ref 0–44)
AST: 37 U/L (ref 15–41)
Albumin: 3.5 g/dL (ref 3.5–5.0)
Alkaline Phosphatase: 67 U/L (ref 38–126)
Anion gap: 9 (ref 5–15)
BUN: 20 mg/dL (ref 8–23)
CO2: 26 mmol/L (ref 22–32)
Calcium: 9 mg/dL (ref 8.9–10.3)
Chloride: 104 mmol/L (ref 98–111)
Creatinine, Ser: 1.01 mg/dL (ref 0.61–1.24)
GFR, Estimated: >60 mL/min (ref >60)
Glucose, Bld: 110 mg/dL — ABNORMAL HIGH (ref 70–99)
Potassium: 3.7 mmol/L (ref 3.5–5.1)
Sodium: 139 mmol/L (ref 135–145)
Total Bilirubin: 0.8 mg/dL (ref 0.3–1.2)
Total Protein: 6.3 g/dL — ABNORMAL LOW (ref 6.5–8.1)

## 2020-08-15 NOTE — ED Provider Notes (Signed)
MSE was initiated and I personally evaluated the patient and placed orders (if any) at  10:45 PM on August 15, 2020.  To ED with right lower chest pain and "cracking sound of my ribs". Seen at Urgent Care in [facility] earlier today - ?PTX. No nausea or vomiting.   Today's Vitals   08/15/20 2128 08/15/20 2232  BP: (!) 125/101   Pulse: 63   Resp: 20   Temp: 98 F (36.7 C)   TempSrc: Oral   SpO2: 99%   PainSc:  10-Worst pain ever   There is no height or weight on file to calculate BMI.  RRR Rales RLL, no SQ emphysema No hypoxia  The patient appears stable so that the remainder of the MSE may be completed by another provider.   Elpidio Anis, PA-C 08/15/20 2247    Horton, Clabe Seal, DO 08/15/20 2306

## 2020-08-15 NOTE — ED Triage Notes (Signed)
Pt arrive POV from UC for righs side rib cage pain and SOB, per pt provider on UC states he may have a pneumothorax.

## 2020-08-16 MED ORDER — LIDOCAINE 5 % EX PTCH: 1.0000 | MEDICATED_PATCH | CUTANEOUS | 0 refills | Status: DC

## 2020-08-16 MED ORDER — CYCLOBENZAPRINE HCL 10 MG PO TABS: 10.0000 mg | ORAL_TABLET | Freq: Three times a day (TID) | ORAL | 0 refills | Status: DC

## 2020-08-16 MED ORDER — LIDOCAINE 5 % EX PTCH: 1.0000 | MEDICATED_PATCH | CUTANEOUS | 0 refills | Status: AC

## 2020-08-16 MED ORDER — LIDOCAINE 5 % EX PTCH
1.0000 | MEDICATED_PATCH | CUTANEOUS | Status: DC
  Administered 2020-08-16: 1 via TRANSDERMAL
  Filled 2020-08-16: qty 1

## 2020-08-16 MED ORDER — NAPROXEN 375 MG PO TABS: 375.0000 mg | ORAL_TABLET | Freq: Two times a day (BID) | ORAL | 0 refills | Status: DC

## 2020-08-16 MED ORDER — NAPROXEN 375 MG PO TABS: 375.0000 mg | ORAL_TABLET | Freq: Two times a day (BID) | ORAL | 0 refills | Status: AC

## 2020-08-16 MED ORDER — KETOROLAC TROMETHAMINE 15 MG/ML IJ SOLN
15.0000 mg | Freq: Once | INTRAMUSCULAR | Status: AC
  Administered 2020-08-16: 15 mg via INTRAMUSCULAR
  Filled 2020-08-16: qty 1

## 2020-08-16 MED ORDER — CYCLOBENZAPRINE HCL 10 MG PO TABS
10.0000 mg | ORAL_TABLET | Freq: Once | ORAL | Status: AC
  Administered 2020-08-16: 10 mg via ORAL
  Filled 2020-08-16: qty 1

## 2020-08-16 MED ORDER — CYCLOBENZAPRINE HCL 10 MG PO TABS: 10.0000 mg | ORAL_TABLET | Freq: Three times a day (TID) | ORAL | 0 refills | Status: AC

## 2020-08-16 NOTE — Discharge Instructions (Addendum)
I have prescribed a short prescription for muscle relaxers to help with your pain.  Please take 1 tablet up to 3 times a day for the next 7 days.  Please be aware this medication can make you drowsy, do not drink alcohol or drive while taking this medication.  I have also provided a lidocaine patch, you may place this to the area to help with pain.  That there are medication is an anti-inflammatory, please take this as prescribed with food.

## 2020-08-16 NOTE — ED Notes (Signed)
Patient returned to lobby.. 
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
## 2020-08-16 NOTE — ED Notes (Signed)
Patient stepped outside to check on wife who is waiting in car, will obtain VS when patient returns

## 2020-09-27 ENCOUNTER — Ambulatory Visit: Payer: No Typology Code available for payment source | Admitting: Urology

## 2020-10-23 NOTE — Progress Notes (Signed)
10/24/2020 10:56 AM   Carlos BustardWilbert Ludington [birth year] [id number]  Referring provider: Ronal FearLam, Lynn E, NP [address] [place],  [place] [id number]  Urological history: 1. High risk hematuria -smoker -CTU in 10/2017 noted no nephroureterolithiasis. No hydronephrosis. No suspicious enhancing renal mass. No filling defects identified within the opacified renal collecting systems and ureters on delayed images.  Hepatic steatosis.   -Cystoscopy on 11/11/2017 was not completed as patient would not allow the scope to pass.   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 Chronic pelvic pain  3. BPH with LU TS -PSA pending -I PSS 19/5 -managed with Uroxatral 10 mg daily   4. ED -contributing factors of age, BPH, HTN, alcohol abuse, smoking and pain medication -SHIM 15  5. Family history of prostate cancer -father was diagnosed with fatal prostate cancer at age 65  Chief Complaint  Patient presents with   Benign Prostatic Hypertrophy   HPI: Carlos BustardWilbert Reynolds is a 65 y.o. male who presents today for yearly visit.  He has a weak urinary stream and nocturia x 2-3.  Patient denies any modifying or aggravating factors.  Patient denies any gross hematuria, dysuria or suprapubic/flank pain.  Patient denies any fevers, chills, nausea or vomiting.     IPSS     Row Name 10/24/20 1100         International Prostate Symptom Score   How often have you had the sensation of not emptying your bladder? About half the time     How often have you had to urinate less than every two hours? Less than half the time     How often have you found you stopped and started again several times when you urinated? More than half the time     How often have you found it difficult to postpone urination? Almost always     How often have you had a weak urinary stream? About half the time     How often have you had to strain to start urination? Not at  All     How many times did you typically get up at night to urinate? 2 Times     Total IPSS Score 19           Quality of Life due to urinary symptoms   If you were to spend the rest of your life with your urinary condition just the way it is now how would you feel about that? Unhappy              Score:  1-7 Mild 8-19 Moderate 20-35 Severe   Patient is not having spontaneous erections.  He denies any pain or curvature with erections.  His daughter gave him "honey" and it was effective for erections.  I looked it up on the Internet and it contains tadalafil.      SHIM     Row Name 10/24/20 1146         SHIM: Over the last 6 months:   How do you rate your confidence that you could get and keep an erection? Low     When you had erections with sexual stimulation, how often were your erections hard enough for penetration (entering your partner)? Sometimes (about half the time)     During sexual intercourse, how often were you able to maintain your erection after you had penetrated (entered) your partner? Sometimes (about half the time)  During sexual intercourse, how difficult was it to maintain your erection to completion of intercourse? Difficult     When you attempted sexual intercourse, how often was it satisfactory for you? Most Times (much more than half the time)           SHIM Total Score   SHIM 15              Score: 1-7 Severe ED 8-11 Moderate ED 12-16 Mild-Moderate ED 17-21 Mild ED 22-25 No ED   PMH: Past Medical History:  Diagnosis Date   Anxiety    Arthritis    Depression    Heartburn    Hematuria    HLD (hyperlipidemia)    Hypertension    Kidney stone    Pancreatitis 05/20/2017    Surgical History: Past Surgical History:  Procedure Laterality Date   COLONOSCOPY     COLONOSCOPY WITH PROPOFOL N/A 04/15/2017   Procedure: COLONOSCOPY WITH PROPOFOL;  Surgeon: Wyline Mood, MD;  Location[facility] ENDOSCOPY;  Service: Gastroenterology;   Laterality: N/A;   NO PAST SURGERIES     verified with patient on 01/15/17    Home Medications:  Allergies as of 10/24/2020       Reactions   Shellfish Allergy Hives   Shellfish Allergy Hives   Amlodipine Other (See Comments)   Possible hand swelling   Lisinopril Other (See Comments)   Cough   Tape Rash   Brown, elastic tape used by physical therapist; skin breaks out        Medication List        Accurate as of October 24, 2020 11:59 PM. If you have any questions, ask your nurse or doctor.          STOP taking these medications    sildenafil 20 MG tablet Commonly known as: REVATIO Stopped by: Haron Beilke, PA-C       TAKE these medications    albuterol 108 (90 Base) MCG/ACT inhaler Commonly known as: VENTOLIN HFA Inhale 1-2 puffs into the lungs every 6 (six) hours as needed for wheezing or shortness of breath.   alfuzosin 10 MG 24 hr tablet Commonly known as: UROXATRAL Take 1 tablet (10 mg total) by mouth daily with breakfast.   diazepam 10 MG tablet Commonly known as: Valium Take one tablet 30 minutes prior to cysto   FLUoxetine 20 MG capsule Commonly known as: PROZAC Take 1 capsule (20 mg total) by mouth daily.   gabapentin 300 MG capsule Commonly known as: NEURONTIN Take 300 mg by mouth 2 (two) times daily.   gabapentin 300 MG capsule Commonly known as: Neurontin Take 1 capsule (300 mg total) by mouth 2 (two) times daily.   hydrALAZINE 25 MG tablet Commonly known as: APRESOLINE Take 25 mg by mouth every 8 (eight) hours.   hydrochlorothiazide 25 MG tablet Commonly known as: HYDRODIURIL Take 25 mg by mouth daily.   HYDROcodone-acetaminophen 7.5-325 MG tablet Commonly known as: NORCO Take 1 tablet by mouth 2 (two) times daily as needed for moderate pain. For chronic pain.  To fill on or after 01/13/2018, 02/11/2018, 03/13/2018   omeprazole 20 MG capsule Commonly known as: PRILOSEC Take 20 mg by mouth daily.   omeprazole 20 MG  capsule Commonly known as: PRILOSEC TAKE 1 CAPSULE BY MOUTH ONCE DAILY   oxyCODONE 5 MG immediate release tablet Commonly known as: Oxy IR/ROXICODONE Take 1 tablet (5 mg total) by mouth every 6 (six) hours as needed for breakthrough pain.   oxyCODONE-acetaminophen 5-325  MG tablet Commonly known as: PERCOCET/ROXICET Take 1 tablet by mouth every 4 (four) hours as needed for moderate pain or severe pain.   pravastatin 20 MG tablet Commonly known as: PRAVACHOL Take 20 mg by mouth at bedtime.   tadalafil 20 MG tablet Commonly known as: CIALIS Take 1 tablet (20 mg total) by mouth daily as needed for erectile dysfunction. Started by: Michiel Cowboy, PA-C   tamsulosin 0.4 MG Caps capsule Commonly known as: FLOMAX Take 0.4 mg by mouth daily.        Allergies:  Allergies  Allergen Reactions   Shellfish Allergy Hives   Shellfish Allergy Hives   Amlodipine Other (See Comments)    Possible hand swelling   Lisinopril Other (See Comments)    Cough   Tape Rash    Manson Passey, elastic tape used by physical therapist; skin breaks out    Family History: Family History  Problem Relation Age of Onset   Heart disease Mother    Colon cancer Mother    Diabetes Father    Prostate cancer Father    Colon cancer Brother    Heart disease Brother    Kidney cancer Neg Hx    Bladder Cancer Neg Hx     Social History:  reports that he has never smoked. He has never used smokeless tobacco. He reports current alcohol use of about 2.0 standard drinks per week. He reports that he does not use drugs.  ROS: Pertinent ROS in HPI  Physical Exam: BP 139/79   Pulse 72   Ht 5\' 9"  (1.753 m)   Wt 207 lb (93.9 kg)   BMI 30.57 kg/m   Constitutional:  Well nourished. Alert and oriented, No acute distress. HEENT: [place] AT, mask in place  Trachea midline Cardiovascular: No clubbing, cyanosis, or edema. Respiratory: Normal respiratory effort, no increased work of breathing. GU: No CVA tenderness.  No  bladder fullness or masses.  Patient with circumcised phallus.  Urethral meatus is patent.  No penile discharge. No penile lesions or rashes. Scrotum without lesions, cysts, rashes and/or edema.  Testicles are located scrotally bilaterally. No masses are appreciated in the testicles. Left and right epididymis are normal. Rectal: Patient with  normal sphincter tone. Anus and perineum without scarring or rashes. No rectal masses are appreciated. Prostate is approximately 45 + grams, could only palpate the apex, no nodules are appreciated. Seminal vesicles could not palpate. Neurologic: Grossly intact, no focal deficits, moving all 4 extremities. Psychiatric: Normal mood and affect.  Laboratory Data: Lab Results  Component Value Date   WBC 5.4 08/15/2020   HGB 13.7 08/15/2020   HCT 41.4 08/15/2020   MCV 92.8 08/15/2020   PLT 262 08/15/2020    Lab Results  Component Value Date   CREATININE 1.01 08/15/2020    Lab Results  Component Value Date   AST 37 08/15/2020   Lab Results  Component Value Date   ALT 34 08/15/2020    Urinalysis Component     Latest Ref Rng & Units 10/24/2020  Specific Gravity, UA     1.005 - 1.030 1.025  pH, UA     5.0 - 7.5 5.5  Color, UA     Yellow Yellow  Appearance Ur     Clear Hazy (A)  Leukocytes,UA     Negative Negative  Protein,UA     Negative/Trace Negative  Glucose, UA     Negative Negative  Ketones, UA     Negative Negative  RBC, UA  Negative Trace (A)  Bilirubin, UA     Negative Negative  Urobilinogen, Ur     0.2 - 1.0 mg/dL 0.2  Nitrite, UA     Negative Negative  Microscopic Examination      See below:   Component     Latest Ref Rng & Units 10/24/2020  WBC, UA     0 - 5 /hpf 0-5  RBC     0 - 2 /hpf 0-2  Epithelial Cells (non renal)     0 - 10 /hpf 0-10  Bacteria, UA     None seen/Few None seen  I have reviewed the labs.   Pertinent Imaging: N/A  Assessment & Plan:    1. High risk hematuria -work up in 2019 -  NED -no report of gross heme -UA neg heme   2. BPH with LU TS -PSA pending -DRE benign -continue Uroxatral 10 mg daily   3. ED -have a trial of tadalafil 20 mg, on-demand-dosing-script sent to phamacy   Return in about 1 year (around 10/24/2021) for IPSS, SHIM, PSA and exam.  These notes generated with voice recognition software. I apologize for typographical errors.  Michiel Cowboy, PA-C  [facility] [address]  [address] [place], [place] [address] [phone]

## 2020-10-24 ENCOUNTER — Other Ambulatory Visit: Payer: Self-pay

## 2020-10-24 ENCOUNTER — Ambulatory Visit (INDEPENDENT_AMBULATORY_CARE_PROVIDER_SITE_OTHER): Payer: PRIVATE HEALTH INSURANCE | Admitting: Urology

## 2020-10-24 ENCOUNTER — Encounter: Payer: Self-pay | Admitting: Urology

## 2020-10-24 VITALS — BP 139/79 | HR 72 | Ht 69.0 in | Wt 207.0 lb

## 2020-10-24 DIAGNOSIS — N529 Male erectile dysfunction, unspecified: Secondary | ICD-10-CM

## 2020-10-24 DIAGNOSIS — N138 Other obstructive and reflux uropathy: Secondary | ICD-10-CM | POA: Diagnosis not present

## 2020-10-24 DIAGNOSIS — R319 Hematuria, unspecified: Secondary | ICD-10-CM

## 2020-10-24 DIAGNOSIS — N401 Enlarged prostate with lower urinary tract symptoms: Secondary | ICD-10-CM

## 2020-10-24 LAB — MICROSCOPIC EXAMINATION: Bacteria, UA: NONE SEEN

## 2020-10-24 LAB — URINALYSIS, COMPLETE
Bilirubin, UA: NEGATIVE
Glucose, UA: NEGATIVE
Ketones, UA: NEGATIVE
Leukocytes,UA: NEGATIVE
Nitrite, UA: NEGATIVE
Protein,UA: NEGATIVE
Specific Gravity, UA: 1.025 (ref 1.005–1.030)
Urobilinogen, Ur: 0.2 mg/dL (ref 0.2–1.0)
pH, UA: 5.5 (ref 5.0–7.5)

## 2020-10-24 MED ORDER — TADALAFIL 20 MG PO TABS: 20.0000 mg | ORAL_TABLET | Freq: Every day | ORAL | 3 refills | Status: DC | PRN

## 2020-10-25 LAB — PSA: Prostate Specific Ag, Serum: 1.3 ng/mL (ref 0.0–4.0)

## 2020-10-26 ENCOUNTER — Telehealth: Payer: Self-pay | Admitting: Family Medicine

## 2020-10-26 NOTE — Telephone Encounter (Signed)
Left message informing patient of normal PSA.

## 2020-10-26 NOTE — Telephone Encounter (Signed)
-----   Message from Harle Battiest, PA-C sent at 10/25/2020  9:42 AM EDT ----- Please let Carlos Reynolds know that his PSA was normal at 1.3.  We will see him next year.

## 2021-06-10 DIAGNOSIS — R5383 Other fatigue: Secondary | ICD-10-CM | POA: Diagnosis not present

## 2021-06-10 DIAGNOSIS — Z1159 Encounter for screening for other viral diseases: Secondary | ICD-10-CM | POA: Diagnosis not present

## 2021-06-10 DIAGNOSIS — R030 Elevated blood-pressure reading, without diagnosis of hypertension: Secondary | ICD-10-CM | POA: Diagnosis not present

## 2021-06-10 DIAGNOSIS — Z79899 Other long term (current) drug therapy: Secondary | ICD-10-CM | POA: Diagnosis not present

## 2021-06-10 DIAGNOSIS — E559 Vitamin D deficiency, unspecified: Secondary | ICD-10-CM | POA: Diagnosis not present

## 2021-06-10 DIAGNOSIS — M25512 Pain in left shoulder: Secondary | ICD-10-CM | POA: Diagnosis not present

## 2021-06-10 DIAGNOSIS — M25511 Pain in right shoulder: Secondary | ICD-10-CM | POA: Diagnosis not present

## 2021-06-10 DIAGNOSIS — M5135 Other intervertebral disc degeneration, thoracolumbar region: Secondary | ICD-10-CM | POA: Diagnosis not present

## 2021-06-10 DIAGNOSIS — Z0000 Encounter for general adult medical examination without abnormal findings: Secondary | ICD-10-CM | POA: Diagnosis not present

## 2021-06-10 DIAGNOSIS — M1611 Unilateral primary osteoarthritis, right hip: Secondary | ICD-10-CM | POA: Diagnosis not present

## 2021-06-10 DIAGNOSIS — M542 Cervicalgia: Secondary | ICD-10-CM | POA: Diagnosis not present

## 2021-09-03 ENCOUNTER — Encounter (HOSPITAL_COMMUNITY): Payer: Self-pay

## 2021-09-03 ENCOUNTER — Other Ambulatory Visit: Payer: Self-pay

## 2021-09-03 ENCOUNTER — Emergency Department (HOSPITAL_COMMUNITY)
Admission: EM | Admit: 2021-09-03 | Discharge: 2021-09-03 | Disposition: A | Discharge status: Home / Self Care | Payer: 59 | Attending: Emergency Medicine | Admitting: Emergency Medicine

## 2021-09-03 DIAGNOSIS — D72829 Elevated white blood cell count, unspecified: Secondary | ICD-10-CM | POA: Insufficient documentation

## 2021-09-03 DIAGNOSIS — L03113 Cellulitis of right upper limb: Secondary | ICD-10-CM | POA: Diagnosis not present

## 2021-09-03 DIAGNOSIS — M7989 Other specified soft tissue disorders: Secondary | ICD-10-CM | POA: Diagnosis present

## 2021-09-03 LAB — COMPREHENSIVE METABOLIC PANEL
ALT: 17 U/L (ref 0–44)
AST: 21 U/L (ref 15–41)
Albumin: 3.7 g/dL (ref 3.5–5.0)
Alkaline Phosphatase: 61 U/L (ref 38–126)
Anion gap: 11 (ref 5–15)
BUN: 16 mg/dL (ref 8–23)
CO2: 25 mmol/L (ref 22–32)
Calcium: 8.9 mg/dL (ref 8.9–10.3)
Chloride: 100 mmol/L (ref 98–111)
Creatinine, Ser: 1.16 mg/dL (ref 0.61–1.24)
GFR, Estimated: >60 mL/min (ref >60)
Glucose, Bld: 104 mg/dL — ABNORMAL HIGH (ref 70–99)
Potassium: 3.7 mmol/L (ref 3.5–5.1)
Sodium: 136 mmol/L (ref 135–145)
Total Bilirubin: 0.4 mg/dL (ref 0.3–1.2)
Total Protein: 6.8 g/dL (ref 6.5–8.1)

## 2021-09-03 LAB — CBC WITH DIFFERENTIAL/PLATELET
Abs Immature Granulocytes: 0.04 10*3/uL (ref 0.00–0.07)
Basophils Absolute: 0 10*3/uL (ref 0.0–0.1)
Basophils Relative: 0 %
Eosinophils Absolute: 0.1 10*3/uL (ref 0.0–0.5)
Eosinophils Relative: 1 %
HCT: 43.9 % (ref 39.0–52.0)
Hemoglobin: 14.8 g/dL (ref 13.0–17.0)
Immature Granulocytes: 0 %
Lymphocytes Relative: 13 %
Lymphs Abs: 1.4 10*3/uL (ref 0.7–4.0)
MCH: 30 pg (ref 26.0–34.0)
MCHC: 33.7 g/dL (ref 30.0–36.0)
MCV: 89 fL (ref 80.0–100.0)
Monocytes Absolute: 0.7 10*3/uL (ref 0.1–1.0)
Monocytes Relative: 7 %
Neutro Abs: 8.7 10*3/uL — ABNORMAL HIGH (ref 1.7–7.7)
Neutrophils Relative %: 79 %
Platelets: 278 10*3/uL (ref 150–400)
RBC: 4.93 MIL/uL (ref 4.22–5.81)
RDW: 14 % (ref 11.5–15.5)
WBC: 11 10*3/uL — ABNORMAL HIGH (ref 4.0–10.5)
nRBC: 0 % (ref 0.0–0.2)

## 2021-09-03 MED ORDER — CEPHALEXIN 500 MG PO CAPS: 500.0000 mg | ORAL_CAPSULE | Freq: Four times a day (QID) | ORAL | 0 refills | Status: AC

## 2021-09-03 MED ORDER — SODIUM CHLORIDE 0.9 % IV SOLN
2.0000 g | Freq: Once | INTRAVENOUS | Status: AC
  Administered 2021-09-03: 2 g via INTRAVENOUS
  Filled 2021-09-03: qty 20

## 2021-09-03 MED ORDER — ACETAMINOPHEN 325 MG PO TABS: 650.0000 mg | ORAL_TABLET | Freq: Four times a day (QID) | ORAL | Status: DC | PRN

## 2021-09-03 MED ORDER — ACETAMINOPHEN 500 MG PO TABS
500.0000 mg | ORAL_TABLET | Freq: Four times a day (QID) | ORAL | 0 refills | Status: AC | PRN
Start: 2021-09-03 — End: ?

## 2021-09-03 NOTE — ED Triage Notes (Signed)
Pt arrived POV from home c/o a possible spider bite. Pt has blisters in the bend of his arm and is swollen and red. Pt states he has been bit in the past by a spider and it presented the same.

## 2021-09-03 NOTE — Discharge Instructions (Signed)
You were seen in the emergency department for evaluation of cellulitis of your right upper extremity.  As we discussed please continue symptomatic management at home with both ibuprofen and Tylenol.  You were written a prescription for Keflex to take for the next 7 days in the setting of this infection.  We recommend close follow-up with your primary care provider and recommend reevaluation in the next 3 days.  As we discussed should you have further worsening of your symptoms after attempted antibiotics you may follow-up with your PCP or return to the emergency department for reevaluation.

## 2021-09-03 NOTE — ED Provider Notes (Signed)
  Physical Exam  BP (!) 133/113   Pulse 70   Temp (!) 100.7 F (38.2 C) (Oral)   Resp 18   Ht 5\' 10"  (1.778 m)   Wt 89.8 kg   SpO2 97%   BMI 28.41 kg/m   Physical Exam Vitals and nursing note reviewed.  Constitutional:      General: He is not in acute distress.    Appearance: He is well-developed.  HENT:     Mouth/Throat:     Mouth: Mucous membranes are moist.     Pharynx: Oropharynx is clear.  Eyes:     Extraocular Movements: Extraocular movements intact.     Conjunctiva/sclera: Conjunctivae normal.     Pupils: Pupils are equal, round, and reactive to light.  Cardiovascular:     Rate and Rhythm: Normal rate and regular rhythm.     Heart sounds: No murmur heard. Pulmonary:     Effort: Pulmonary effort is normal. No respiratory distress.     Breath sounds: Normal breath sounds.  Abdominal:     Palpations: Abdomen is soft.     Tenderness: There is no abdominal tenderness.  Musculoskeletal:        General: No swelling.     Cervical back: Neck supple.  Skin:    General: Skin is warm and dry.     Capillary Refill: Capillary refill takes less than 2 seconds.     Findings: Erythema present. No rash.     Comments: Swelling, erythema, and tenderness to palpation over the right antecubital fossa.  No area of fluctuance identified.  No evidence of lymphangitic streaking.  No axillary lymphadenopathy.  Neurological:     Mental Status: He is alert and oriented to person, place, and time.  Psychiatric:        Mood and Affect: Mood normal.     Procedures  Procedures  ED Course / MDM    Medical Decision Making Problems Addressed: Cellulitis of right upper extremity: complicated acute illness or injury with systemic symptoms  Amount and/or Complexity of Data Reviewed Independent Historian: spouse Labs: ordered.  Risk OTC drugs. Prescription drug management.   Patient is a 66 year old male who presents emergency department for evaluation of of several days of pain,  redness, swelling to his right AC fossa.  No obvious drainage or area of fluctuance identified.  Denies any fever streaking.  Patient remained afebrile in the emergency department and was nontachycardic.  He continued saturating appropriately on room air.  Baseline CMP was grossly unremarkable.  CBC with mild leukocytosis of 11.  Clinical presentation and laboratory work makes suspicion highest for cellulitis at this time.  Minimal suspicion for any necrotizing fasciitis in the absence of any crepitus, lymphangitic streaking, signs of tachycardia or fever.  He was given 1 dose of ceftriaxone here in the emergency department.  He was given a prescription for Keflex to take for the next 7 days 4 times a day.  He was also given a prescription for Tylenol.  Patient was instructed to follow-up with his primary care provider and repeat days for symptom recheck.  He was also given strict return precautions regarding any progression of cellulitis.  Plan discussed with patient at bedside and he verbalized understanding was agreeable.  Patient discharged in the ED in stable condition.        76, MD 09/04/21 11/05/21    Ventura Bruns, MD 09/04/21 1744

## 2021-09-03 NOTE — ED Provider Triage Note (Addendum)
Emergency Medicine Provider Triage Evaluation Note  Carlos Reynolds , a 66 y.o. male  was evaluated in triage.  Pt complains of bug bite to the right AC.  Says that he thinks it could have been a spider.  It happened multiple days ago.  Similar to a different time he was bit by a spider.  Review of Systems  Positive: Fatigue Negative: Nausea, vomiting, headaches  Physical Exam  BP 118/68 (BP Location: Left Arm)   Pulse 88   Temp 98.8 F (37.1 C) (Oral)   Resp 16   Ht 5\' 10"  (1.778 m)   Wt 89.8 kg   SpO2 98%   BMI 28.41 kg/m  Gen:   Awake, no distress   Resp:  Normal effort  MSK:   Moves extremities without difficulty  Other:  Erythema and welts associated with small papules in the right AC.  Strong radial pulse.  Sensation intact distally.  Medical Decision Making  Medically screening exam initiated at 11:00 AM.  Appropriate orders placed.  Haim Hansson was informed that the remainder of the evaluation will be completed by another provider, this initial triage assessment does not replace that evaluation, and the importance of remaining in the ED until their evaluation is complete.     Mirjana Tarleton A, PA-C 09/03/21 1101  Able to flex and extend the right elbow.  Says that it is painful but there is no limitation.   11/04/21, PA-C 09/03/21 1101    Dierra Riesgo A, PA-C 09/03/21 1442

## 2021-09-03 NOTE — ED Provider Notes (Signed)
[facility] EMERGENCY DEPARTMENT Provider Note   CSN: [id number] Arrival date & time: 09/03/21  1010     History  Chief Complaint  Patient presents with   Insect Bite    Carlos Reynolds is a 66 y.o. male.  Patient reports he noticed a blister on the inside of his right elbow on Saturday. Pt complains of redness and swelling.  Pt reports arm has become painful. Pt thinks he may have been bitten by something.  Pt reports he has had similar when he had a spider bite in the past.    The history is provided by the patient. No language interpreter was used.       Home Medications Prior to Admission medications   Medication Sig Start Date End Date Taking? Authorizing Provider  albuterol (VENTOLIN HFA) 108 (90 Base) MCG/ACT inhaler Inhale 1-2 puffs into the lungs every 6 (six) hours as needed for wheezing or shortness of breath. 05/04/19   Juliet Rude, PA-C  alfuzosin (UROXATRAL) 10 MG 24 hr tablet Take 1 tablet (10 mg total) by mouth daily with breakfast. 10/14/17   Marvel Plan, Carollee Herter A, PA-C  diazepam (VALIUM) 10 MG tablet Take one tablet 30 minutes prior to cysto 10/14/17   Michiel Cowboy A, PA-C  FLUoxetine (PROZAC) 20 MG capsule Take 1 capsule (20 mg total) by mouth daily. 03/18/17   Galen Manila, NP  gabapentin (NEURONTIN) 300 MG capsule Take 1 capsule (300 mg total) by mouth 2 (two) times daily. 01/13/18   Edward Jolly, MD  gabapentin (NEURONTIN) 300 MG capsule Take 300 mg by mouth 2 (two) times daily.    [provider]  hydrALAZINE (APRESOLINE) 25 MG tablet Take 25 mg by mouth every 8 (eight) hours. 08/18/17   [provider]  hydrochlorothiazide (HYDRODIURIL) 25 MG tablet Take 25 mg by mouth daily.    [provider]  HYDROcodone-acetaminophen (NORCO) 7.5-325 MG tablet Take 1 tablet by mouth 2 (two) times daily as needed for moderate pain. For chronic pain.  To fill on or after 01/13/2018, 02/11/2018, 03/13/2018 01/13/18    Edward Jolly, MD  omeprazole (PRILOSEC) 20 MG capsule TAKE 1 CAPSULE BY MOUTH ONCE DAILY 08/18/17   [provider]  omeprazole (PRILOSEC) 20 MG capsule Take 20 mg by mouth daily.    [provider]  oxyCODONE (OXY IR/ROXICODONE) 5 MG immediate release tablet Take 1 tablet (5 mg total) by mouth every 6 (six) hours as needed for breakthrough pain. 05/04/19   Juliet Rude, PA-C  oxyCODONE-acetaminophen (PERCOCET/ROXICET) 5-325 MG tablet Take 1 tablet by mouth every 4 (four) hours as needed for moderate pain or severe pain.    [provider]  pravastatin (PRAVACHOL) 20 MG tablet Take 20 mg by mouth at bedtime.    [provider]  tadalafil (CIALIS) 20 MG tablet Take 1 tablet (20 mg total) by mouth daily as needed for erectile dysfunction. 10/24/20   Michiel Cowboy A, PA-C  tamsulosin (FLOMAX) 0.4 MG CAPS capsule Take 0.4 mg by mouth daily.    [provider]      Allergies    Shellfish allergy, Shellfish allergy, Amlodipine, Lisinopril, and Tape    Review of Systems   Review of Systems  All other systems reviewed and are negative.   Physical Exam Updated Vital Signs BP 115/72   Pulse 72   Temp (!) 100.7 F (38.2 C) (Oral)   Resp 18   Ht 5\' 10"  (1.778 m)   Wt 89.8  kg   SpO2 97%   BMI 28.41 kg/m  Physical Exam Vitals and nursing note reviewed.  Constitutional:      Appearance: He is well-developed.  HENT:     Head: Normocephalic.  Cardiovascular:     Rate and Rhythm: Normal rate.  Pulmonary:     Effort: Pulmonary effort is normal.  Abdominal:     General: There is no distension.  Musculoskeletal:        General: Swelling and tenderness present.     Cervical back: Normal range of motion.     Comments: Swollen red hard area right antecubital area,  redness up arm.  From with pain,  nv and ns intact   Skin:    General: Skin is warm.  Neurological:     General: No focal deficit present.     Mental Status: He is alert and  oriented to person, place, and time.  Psychiatric:        Mood and Affect: Mood normal.   MDM:  Pt has a cellulitic area right elbow.  Labs ordered.  IV Rocephin 2 grams IV.   Pt's care turned over to oncoming provider  ED Results / Procedures / Treatments   Labs (all labs ordered are listed, but only abnormal results are displayed) Labs Reviewed  CULTURE, BLOOD (ROUTINE X 2)  CULTURE, BLOOD (ROUTINE X 2)  CBC WITH DIFFERENTIAL/PLATELET  COMPREHENSIVE METABOLIC PANEL    EKG None  Radiology No results found.  Procedures Procedures    Medications Ordered in ED Medications  cefTRIAXone (ROCEPHIN) 2 g in sodium chloride 0.9 % 100 mL IVPB (has no administration in time range)    ED Course/ Medical Decision Making/ A&P                           Medical Decision Making Amount and/or Complexity of Data Reviewed Labs: ordered.           Final Clinical Impression(s) / ED Diagnoses Final diagnoses:  Cellulitis of right upper extremity    Rx / DC Orders ED Discharge Orders     None         Elson Areas, [place] 09/03/21 1500    Vanetta Mulders, MD 09/03/21 [phone]

## 2021-09-07 LAB — CULTURE, BLOOD (ROUTINE X 2)
Culture: NO GROWTH
Culture: NO GROWTH

## 2021-09-08 LAB — CULTURE, BLOOD (ROUTINE X 2)

## 2021-10-19 ENCOUNTER — Other Ambulatory Visit: Payer: PRIVATE HEALTH INSURANCE

## 2021-10-19 ENCOUNTER — Encounter: Payer: Self-pay | Admitting: Urology

## 2021-10-23 NOTE — Progress Notes (Deleted)
10/24/2021 9:50 PM   Carlos Reynolds 1955-05-02 [id number]  Referring provider: Ronal Fear, NP [address] [place],  [place] [address]  Urological history: 1. High risk hematuria -smoker -CTU in 10/2017 noted no nephroureterolithiasis. No hydronephrosis. No suspicious enhancing renal mass. No filling defects identified within the opacified renal collecting systems and ureters on delayed images.  Hepatic steatosis.   -Cystoscopy on 11/11/2017 was not completed as patient would not allow the scope to pass.   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 Chronic pelvic pain  3. BPH with LU TS -I PSS *** -PVR *** -Uroxatral 10 mg daily   4. ED -contributing factors of age, BPH, HTN, alcohol abuse, smoking and pain medication -SHIM *** -tadalafil 20 mg, on-demand-dosing  5. Prostate cancer screening -PSA pending -African ancestry -father was diagnosed with fatal prostate cancer at age 25  No chief complaint on file.  HPI: Carlos Reynolds is a 66 y.o. male who presents today for yearly visit.      Score:  1-7 Mild 8-19 Moderate 20-35 Severe         Score: 1-7 Severe ED 8-11 Moderate ED 12-16 Mild-Moderate ED 17-21 Mild ED 22-25 No ED   PMH: Past Medical History:  Diagnosis Date   Anxiety    Arthritis    Depression    Heartburn    Hematuria    HLD (hyperlipidemia)    Hypertension    Kidney stone    Pancreatitis 05/20/2017    Surgical History: Past Surgical History:  Procedure Laterality Date   COLONOSCOPY     COLONOSCOPY WITH PROPOFOL N/A 04/15/2017   Procedure: COLONOSCOPY WITH PROPOFOL;  Surgeon: Wyline Mood, MD;  Location: [facility] ENDOSCOPY;  Service: Gastroenterology;  Laterality: N/A;   NO PAST SURGERIES     verified with patient on 01/15/17    Home Medications:  Allergies as of 10/24/2021       Reactions   Shellfish Allergy Hives   Shellfish Allergy Hives   Amlodipine Other  (See Comments)   Possible hand swelling   Lisinopril Other (See Comments)   Cough   Tape Rash   Brown, elastic tape used by physical therapist; skin breaks out        Medication List        Accurate as of October 23, 2021  9:50 PM. If you have any questions, ask your nurse or doctor.          acetaminophen 500 MG tablet Commonly known as: TYLENOL Take 1 tablet (500 mg total) by mouth every 6 (six) hours as needed.   albuterol 108 (90 Base) MCG/ACT inhaler Commonly known as: VENTOLIN HFA Inhale 1-2 puffs into the lungs every 6 (six) hours as needed for wheezing or shortness of breath.   alfuzosin 10 MG 24 hr tablet Commonly known as: UROXATRAL Take 1 tablet (10 mg total) by mouth daily with breakfast.   diazepam 10 MG tablet Commonly known as: Valium Take one tablet 30 minutes prior to cysto   FLUoxetine 20 MG capsule Commonly known as: PROZAC Take 1 capsule (20 mg total) by mouth daily.   gabapentin 300 MG capsule Commonly known as: Neurontin Take 1 capsule (300 mg total) by mouth 2 (two) times daily.   hydrochlorothiazide 25 MG tablet Commonly known as: HYDRODIURIL Take 25 mg by mouth daily.   HYDROcodone-acetaminophen 7.5-325 MG tablet Commonly known as: NORCO Take 1 tablet by mouth 2 (two) times daily  as needed for moderate pain. For chronic pain.  To fill on or after 01/13/2018, 02/11/2018, 03/13/2018   losartan 25 MG tablet Commonly known as: COZAAR Take 25 mg by mouth daily.   oxyCODONE 5 MG immediate release tablet Commonly known as: Oxy IR/ROXICODONE Take 1 tablet (5 mg total) by mouth every 6 (six) hours as needed for breakthrough pain.   pantoprazole 40 MG tablet Commonly known as: PROTONIX Take 40 mg by mouth daily.   rosuvastatin 20 MG tablet Commonly known as: CRESTOR Take 20 mg by mouth at bedtime.   tadalafil 20 MG tablet Commonly known as: CIALIS Take 1 tablet (20 mg total) by mouth daily as needed for erectile dysfunction.    tamsulosin 0.4 MG Caps capsule Commonly known as: FLOMAX Take 0.4 mg by mouth daily.        Allergies:  Allergies  Allergen Reactions   Shellfish Allergy Hives   Shellfish Allergy Hives   Amlodipine Other (See Comments)    Possible hand swelling   Lisinopril Other (See Comments)    Cough   Tape Rash    Manson Passey, elastic tape used by physical therapist; skin breaks out    Family History: Family History  Problem Relation Age of Onset   Heart disease Mother    Colon cancer Mother    Diabetes Father    Prostate cancer Father    Colon cancer Brother    Heart disease Brother    Kidney cancer Neg Hx    Bladder Cancer Neg Hx     Social History:  reports that he has never smoked. He has never used smokeless tobacco. He reports current alcohol use of about 2.0 standard drinks of alcohol per week. He reports that he does not use drugs.  ROS: Pertinent ROS in HPI  Physical Exam: There were no vitals taken for this visit.  Constitutional:  Well nourished. Alert and oriented, No acute distress. HEENT: [place] AT, moist mucus membranes.  Trachea midline Cardiovascular: No clubbing, cyanosis, or edema. Respiratory: Normal respiratory effort, no increased work of breathing. GU: No CVA tenderness.  No bladder fullness or masses.  Patient with circumcised/uncircumcised phallus. ***Foreskin easily retracted***  Urethral meatus is patent.  No penile discharge. No penile lesions or rashes. Scrotum without lesions, cysts, rashes and/or edema.  Testicles are located scrotally bilaterally. No masses are appreciated in the testicles. Left and right epididymis are normal. Rectal: Patient with  normal sphincter tone. Anus and perineum without scarring or rashes. No rectal masses are appreciated. Prostate is approximately *** grams, *** nodules are appreciated. Seminal vesicles are normal. Neurologic: Grossly intact, no focal deficits, moving all 4 extremities. Psychiatric: Normal mood and affect.    Laboratory Data: Lab Results  Component Value Date   WBC 11.0 (H) 09/03/2021   HGB 14.8 09/03/2021   HCT 43.9 09/03/2021   MCV 89.0 09/03/2021   PLT 278 09/03/2021    Lab Results  Component Value Date   CREATININE 1.16 09/03/2021    Lab Results  Component Value Date   AST 21 09/03/2021   Lab Results  Component Value Date   ALT 17 09/03/2021   Urinalysis *** I have reviewed the labs.   Pertinent Imaging: N/A  Assessment & Plan:    1. High risk hematuria -smoker -work up in 2019 - NED -no report of gross heme -UA ***  2. BPH with LU TS -PSA pending -DRE benign -Uroxatral 10 mg daily   3. ED -tadalafil 20 mg, on-demand-dosing  No follow-ups on file.  These notes generated with voice recognition software. I apologize for typographical errors.  Cloretta Ned  [facility] [address]  [address] [place], [place] [address] [phone]

## 2021-10-24 ENCOUNTER — Ambulatory Visit: Payer: PRIVATE HEALTH INSURANCE | Admitting: Urology

## 2021-10-24 DIAGNOSIS — N529 Male erectile dysfunction, unspecified: Secondary | ICD-10-CM

## 2021-10-24 DIAGNOSIS — N138 Other obstructive and reflux uropathy: Secondary | ICD-10-CM

## 2021-10-24 DIAGNOSIS — R319 Hematuria, unspecified: Secondary | ICD-10-CM

## 2021-10-26 ENCOUNTER — Encounter: Payer: Self-pay | Admitting: Urology

## 2021-11-12 ENCOUNTER — Telehealth: Payer: Self-pay | Admitting: Urology

## 2021-11-12 NOTE — Telephone Encounter (Signed)
He is due for his annual PSA, I PSS, SHIM and exam at this time.  Would you call him and get this scheduled?

## 2021-11-14 NOTE — Telephone Encounter (Signed)
Unable to reach patient both numbers we have on file have been disconnected.

## 2021-11-26 ENCOUNTER — Telehealth: Payer: Self-pay | Admitting: Urology

## 2021-11-26 NOTE — Telephone Encounter (Signed)
Pt is out of Flomax.  He has an appt scheduled 10/10 @ 3 and would like to have a couple weeks Flomax sent in to [facility] in [place].

## 2021-12-03 NOTE — Progress Notes (Unsigned)
12/04/2021 4:04 PM   Carlos Reynolds September 05, 1955 [id number]  Referring provider: Ailene Ravel, MD [address] [place],  [place] [address]  Urological history: 1. High risk hematuria -smoker -CTU in 10/2017 noted no nephroureterolithiasis. No hydronephrosis. No suspicious enhancing renal mass. No filling defects identified within the opacified renal collecting systems and ureters on delayed images.  Hepatic steatosis.   -Cystoscopy on 11/11/2017 was not completed as patient would not allow the scope to pass.   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 Chronic pelvic pain  3. BPH with LU TS -I PSS 19/5 -tamsulosin 0.4 mg daily   4. ED -contributing factors of age, BPH, HTN, alcohol abuse, cocaine use, smoking and pain medication -SHIM 20 -tadalafil 20 mg, on-demand-dosing  5. Prostate cancer screening -PSA pending -AUA guidelines (2023) recommend screening for men ages 96-69 -African ancestry -father was diagnosed with fatal prostate cancer at age 52  Chief Complaint  Patient presents with   Benign Prostatic Hypertrophy   Hematuria   Erectile Dysfunction   HPI: Carlos Reynolds is a 66 y.o. male who presents today for yearly visit.  He has been w/o medication for one week.  He has a decreased urinary stream.  He has baseline nocturia x 3.  He has little stingPatient denies any modifying or aggravating factors.  Patient denies any gross hematuria, dysuria or suprapubic/flank pain.  Patient denies any fevers, chills, nausea or vomiting.      IPSS     Row Name 12/04/21 1500         International Prostate Symptom Score   How often have you had the sensation of not emptying your bladder? Less than half the time     How often have you had to urinate less than every two hours? About half the time     How often have you found you stopped and started again several  times when you urinated? About half the time     How often have you found it difficult to postpone urination? Almost always     How often have you had a weak urinary stream? About half the time     How often have you had to strain to start urination? Not at All     How many times did you typically get up at night to urinate? 3 Times     Total IPSS Score 19       Quality of Life due to urinary symptoms   If you were to spend the rest of your life with your urinary condition just the way it is now how would you feel about that? Unhappy               Score:  1-7 Mild 8-19 Moderate 20-35 Severe   Patient is not having spontaneous erections.  He denies any pain or curvature with erections.  He achieves a good erections w/ watching pornography.  The issue is he cannot watch pornography with his wife.  The tadalafil was not effective.     SHIM     Row Name 12/04/21 1533         SHIM: Over the last 6 months:   How do you rate your confidence that you could get and keep an erection? Moderate     When you had erections with sexual stimulation, how often were your erections hard enough for penetration (entering  your partner)? Most Times (much more than half the time)     During sexual intercourse, how often were you able to maintain your erection after you had penetrated (entered) your partner? Most Times (much more than half the time)     During sexual intercourse, how difficult was it to maintain your erection to completion of intercourse? Slightly Difficult     When you attempted sexual intercourse, how often was it satisfactory for you? Almost Always or Always       SHIM Total Score   SHIM 20               Score: 1-7 Severe ED 8-11 Moderate ED 12-16 Mild-Moderate ED 17-21 Mild ED 22-25 No ED   PMH: Past Medical History:  Diagnosis Date   Anxiety    Arthritis    Depression    Heartburn    Hematuria    HLD (hyperlipidemia)    Hypertension    Kidney stone     Pancreatitis 05/20/2017    Surgical History: Past Surgical History:  Procedure Laterality Date   COLONOSCOPY     COLONOSCOPY WITH PROPOFOL N/A 04/15/2017   Procedure: COLONOSCOPY WITH PROPOFOL;  Surgeon: Wyline Mood, MD;  Location: [facility] ENDOSCOPY;  Service: Gastroenterology;  Laterality: N/A;   NO PAST SURGERIES     verified with patient on 01/15/17    Home Medications:  Allergies as of 12/04/2021       Reactions   Shellfish Allergy Hives   Shellfish Allergy Hives   Amlodipine Other (See Comments)   Possible hand swelling   Lisinopril Other (See Comments)   Cough   Tape Rash   Brown, elastic tape used by physical therapist; skin breaks out        Medication List        Accurate as of December 04, 2021  4:04 PM. If you have any questions, ask your nurse or doctor.          STOP taking these medications    alfuzosin 10 MG 24 hr tablet Commonly known as: UROXATRAL   tadalafil 20 MG tablet Commonly known as: CIALIS       TAKE these medications    acetaminophen 500 MG tablet Commonly known as: TYLENOL Take 1 tablet (500 mg total) by mouth every 6 (six) hours as needed.   albuterol 108 (90 Base) MCG/ACT inhaler Commonly known as: VENTOLIN HFA Inhale 1-2 puffs into the lungs every 6 (six) hours as needed for wheezing or shortness of breath.   diazepam 10 MG tablet Commonly known as: Valium Take one tablet 30 minutes prior to cysto   FLUoxetine 20 MG capsule Commonly known as: PROZAC Take 1 capsule (20 mg total) by mouth daily.   gabapentin 300 MG capsule Commonly known as: Neurontin Take 1 capsule (300 mg total) by mouth 2 (two) times daily.   hydrochlorothiazide 25 MG tablet Commonly known as: HYDRODIURIL Take 25 mg by mouth daily.   HYDROcodone-acetaminophen 7.5-325 MG tablet Commonly known as: NORCO Take 1 tablet by mouth 2 (two) times daily as needed for moderate pain. For chronic pain.  To fill on or after 01/13/2018, 02/11/2018, 03/13/2018    losartan 25 MG tablet Commonly known as: COZAAR Take 25 mg by mouth daily.   oxyCODONE 5 MG immediate release tablet Commonly known as: Oxy IR/ROXICODONE Take 1 tablet (5 mg total) by mouth every 6 (six) hours as needed for breakthrough pain.   pantoprazole 40 MG tablet Commonly known as: PROTONIX Take  40 mg by mouth daily.   rosuvastatin 20 MG tablet Commonly known as: CRESTOR Take 20 mg by mouth at bedtime.   sildenafil 100 MG tablet Commonly known as: VIAGRA Take 1 tablet (100 mg total) by mouth daily as needed for erectile dysfunction. Take two hours prior to intercourse on an empty stomach   tamsulosin 0.4 MG Caps capsule Commonly known as: FLOMAX Take 1 capsule (0.4 mg total) by mouth daily.        Allergies:  Allergies  Allergen Reactions   Shellfish Allergy Hives   Shellfish Allergy Hives   Amlodipine Other (See Comments)    Possible hand swelling   Lisinopril Other (See Comments)    Cough   Tape Rash    Manson Passey, elastic tape used by physical therapist; skin breaks out    Family History: Family History  Problem Relation Age of Onset   Heart disease Mother    Colon cancer Mother    Diabetes Father    Prostate cancer Father    Colon cancer Brother    Heart disease Brother    Kidney cancer Neg Hx    Bladder Cancer Neg Hx     Social History:  reports that he has never smoked. He has never used smokeless tobacco. He reports current alcohol use of about 2.0 standard drinks of alcohol per week. He reports that he does not use drugs.  ROS: Pertinent ROS in HPI  Physical Exam: BP (!) 152/84   Pulse 66   Ht 5\' 9"  (1.753 m)   Wt 201 lb (91.2 kg)   BMI 29.68 kg/m   Constitutional:  Well nourished. Alert and oriented, No acute distress. HEENT: [place] AT, moist mucus membranes.  Trachea midline Cardiovascular: No clubbing, cyanosis, or edema. Respiratory: Normal respiratory effort, no increased work of breathing. GU: No CVA tenderness.  No bladder fullness  or masses.  Patient with circumcised phallus. Urethral meatus is patent.  No penile discharge. No penile lesions or rashes. Scrotum without lesions, cysts, rashes and/or edema.  Testicles are located scrotally bilaterally. No masses are appreciated in the testicles. Left and right epididymis are normal. Rectal: Patient with  normal sphincter tone. Anus and perineum without scarring or rashes. No rectal masses are appreciated. Prostate is approximately 50 + grams, could only palpate the apex, no nodules are appreciated. Seminal vesicles could not be palpated Neurologic: Grossly intact, no focal deficits, moving all 4 extremities. Psychiatric: Normal mood and affect.   Laboratory Data: Lab Results  Component Value Date   WBC 11.0 (H) 09/03/2021   HGB 14.8 09/03/2021   HCT 43.9 09/03/2021   MCV 89.0 09/03/2021   PLT 278 09/03/2021    Lab Results  Component Value Date   CREATININE 1.16 09/03/2021    Lab Results  Component Value Date   AST 21 09/03/2021   Lab Results  Component Value Date   ALT 17 09/03/2021    Urinalysis See Epic and HPI I have reviewed the labs.   Pertinent Imaging: N/A  Assessment & Plan:    1. High risk hematuria -smoker -work up in 2019 - NED -no report of gross heme -UA neg heme   2. BPH with LU TS -DRE benign -continue tamsulosin 0.4 mg daily   3. ED -have a trial of sildenafil 100 mg, on-demand-dosing  4. PSA screening -PSA pending -risk factors of family history and African ancestry -recommend continued screening   Return in about 1 year (around 12/05/2022) for IPSS, SHIM, PSA, UA and  exam.  These notes generated with voice recognition software. I apologize for typographical errors.  [place] [place] [address]  [place], [place] [address] [phone]

## 2021-12-04 ENCOUNTER — Encounter: Payer: Self-pay | Admitting: Urology

## 2021-12-04 ENCOUNTER — Ambulatory Visit (INDEPENDENT_AMBULATORY_CARE_PROVIDER_SITE_OTHER): Payer: 59 | Admitting: Urology

## 2021-12-04 VITALS — BP 152/84 | HR 66 | Ht 69.0 in | Wt 201.0 lb

## 2021-12-04 DIAGNOSIS — N138 Other obstructive and reflux uropathy: Secondary | ICD-10-CM

## 2021-12-04 DIAGNOSIS — N401 Enlarged prostate with lower urinary tract symptoms: Secondary | ICD-10-CM | POA: Diagnosis not present

## 2021-12-04 DIAGNOSIS — N529 Male erectile dysfunction, unspecified: Secondary | ICD-10-CM | POA: Diagnosis not present

## 2021-12-04 DIAGNOSIS — Z125 Encounter for screening for malignant neoplasm of prostate: Secondary | ICD-10-CM

## 2021-12-04 DIAGNOSIS — R319 Hematuria, unspecified: Secondary | ICD-10-CM

## 2021-12-04 MED ORDER — SILDENAFIL CITRATE 100 MG PO TABS: 100.0000 mg | ORAL_TABLET | Freq: Every day | ORAL | 3 refills | Status: DC | PRN

## 2021-12-04 MED ORDER — TAMSULOSIN HCL 0.4 MG PO CAPS: 0.4000 mg | ORAL_CAPSULE | Freq: Every day | ORAL | 3 refills | Status: DC

## 2021-12-05 ENCOUNTER — Telehealth: Payer: Self-pay

## 2021-12-05 LAB — URINALYSIS, COMPLETE
Bilirubin, UA: NEGATIVE
Glucose, UA: NEGATIVE
Ketones, UA: NEGATIVE
Nitrite, UA: NEGATIVE
Protein,UA: NEGATIVE
RBC, UA: NEGATIVE
Specific Gravity, UA: 1.025 (ref 1.005–1.030)
Urobilinogen, Ur: 1 mg/dL (ref 0.2–1.0)
pH, UA: 6 (ref 5.0–7.5)

## 2021-12-05 LAB — MICROSCOPIC EXAMINATION: WBC, UA: >30 /hpf — AB (ref 0–5)

## 2021-12-05 LAB — PSA: Prostate Specific Ag, Serum: 2.6 ng/mL (ref 0.0–4.0)

## 2021-12-05 NOTE — Telephone Encounter (Signed)
-----   Message from Nori Riis, PA-C sent at 12/05/2021  8:44 AM EDT ----- Please let Carlos Reynolds know that his PSA doubled over the last year.  I would like it repeated in three months to see if the PSA will return to baseline.  If it stays at 2.6 or increases even more, it may be due to prostate cancer so we need to keep an eye on this.  For most people, it usually returns to baseline.

## 2021-12-05 NOTE — Telephone Encounter (Signed)
LMOM for pt to return call. 

## 2021-12-07 NOTE — Telephone Encounter (Signed)
LMOM for pt to return call. Attempt #2.

## 2022-01-15 ENCOUNTER — Encounter: Payer: Self-pay | Admitting: Urology

## 2022-02-01 ENCOUNTER — Other Ambulatory Visit: Payer: Self-pay | Admitting: *Deleted

## 2022-02-01 ENCOUNTER — Telehealth: Payer: Self-pay | Admitting: Urology

## 2022-02-01 DIAGNOSIS — N401 Enlarged prostate with lower urinary tract symptoms: Secondary | ICD-10-CM

## 2022-02-01 NOTE — Telephone Encounter (Signed)
S/W pt, he will come in Monday 12/11 for PSA lab

## 2022-02-04 ENCOUNTER — Other Ambulatory Visit: Payer: 59

## 2022-02-04 DIAGNOSIS — N401 Enlarged prostate with lower urinary tract symptoms: Secondary | ICD-10-CM

## 2022-02-05 ENCOUNTER — Telehealth: Payer: Self-pay | Admitting: Family Medicine

## 2022-02-05 LAB — PSA: Prostate Specific Ag, Serum: 2.4 ng/mL (ref 0.0–4.0)

## 2022-02-05 NOTE — Telephone Encounter (Signed)
Patient called asking if results were back. I informed him of the result and made a lab appointment in 6 months for PSA.

## 2022-02-05 NOTE — Telephone Encounter (Signed)
-----   Message from Harle Battiest, PA-C sent at 02/05/2022 10:59 AM EST ----- Please let Mr. Mapps know that his PSA has decreased a bit, so I would like to have it repeated again in 6 months.

## 2022-07-29 ENCOUNTER — Other Ambulatory Visit: Payer: Self-pay | Admitting: Family Medicine

## 2022-07-29 DIAGNOSIS — N138 Other obstructive and reflux uropathy: Secondary | ICD-10-CM

## 2022-08-08 ENCOUNTER — Other Ambulatory Visit: Payer: 59

## 2022-08-15 ENCOUNTER — Other Ambulatory Visit: Payer: Medicare HMO

## 2022-08-15 DIAGNOSIS — N138 Other obstructive and reflux uropathy: Secondary | ICD-10-CM

## 2022-08-16 LAB — PSA: Prostate Specific Ag, Serum: 3 ng/mL (ref 0.0–4.0)

## 2022-11-29 ENCOUNTER — Other Ambulatory Visit: Payer: Self-pay | Admitting: *Deleted

## 2022-11-29 DIAGNOSIS — N401 Enlarged prostate with lower urinary tract symptoms: Secondary | ICD-10-CM

## 2022-12-02 ENCOUNTER — Other Ambulatory Visit: Payer: 59

## 2022-12-02 ENCOUNTER — Other Ambulatory Visit: Payer: No Typology Code available for payment source

## 2022-12-02 DIAGNOSIS — N401 Enlarged prostate with lower urinary tract symptoms: Secondary | ICD-10-CM

## 2022-12-03 LAB — PSA: Prostate Specific Ag, Serum: 2.1 ng/mL (ref 0.0–4.0)

## 2022-12-04 NOTE — Progress Notes (Deleted)
12/05/2022 1:16 PM   Carlos Reynolds 1955-05-15 [id number]  Referring provider: Ailene Ravel, MD [address] [place],  [place] [address]  Urological history: 1. High risk hematuria -smoker -CTU in 10/2017 noted no nephroureterolithiasis. No hydronephrosis. No suspicious enhancing renal mass. No filling defects identified within the opacified renal collecting systems and ureters on delayed images.  Hepatic steatosis.   -Cystoscopy on 11/11/2017 was not completed as patient would not allow the scope to pass.   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 Chronic pelvic pain  3. BPH with LU TS -PSA (11/2022) 2.1  -tamsulosin 0.4 mg daily   4. ED -contributing factors of age, BPH, HTN, alcohol abuse, cocaine use, smoking and pain medication -tadalafil 20 mg, on-demand-dosing  5. Prostate cancer screening -AUA guidelines (2023) recommend screening for men ages 6-69 -African ancestry -father was diagnosed with fatal prostate cancer at age 105  No chief complaint on file.  HPI: Carlos Reynolds is a 67 y.o. male who presents today for yearly visit.  Previous records reviewed.   No acute changes since last visit.       Score:  1-7 Mild 8-19 Moderate 20-35 Severe        Score: 1-7 Severe ED 8-11 Moderate ED 12-16 Mild-Moderate ED 17-21 Mild ED 22-25 No ED   PMH: Past Medical History:  Diagnosis Date   Anxiety    Arthritis    Depression    Heartburn    Hematuria    HLD (hyperlipidemia)    Hypertension    Kidney stone    Pancreatitis 05/20/2017    Surgical History: Past Surgical History:  Procedure Laterality Date   COLONOSCOPY     COLONOSCOPY WITH PROPOFOL N/A 04/15/2017   Procedure: COLONOSCOPY WITH PROPOFOL;  Surgeon: Wyline Mood, MD;  Location[facility] ENDOSCOPY;  Service: Gastroenterology;  Laterality: N/A;   NO PAST SURGERIES     verified with patient on 01/15/17    Home Medications:   Allergies as of 12/05/2022       Reactions   Shellfish Allergy Hives   Shellfish Allergy Hives   Amlodipine Other (See Comments)   Possible hand swelling   Lisinopril Other (See Comments)   Cough   Tape Rash   Brown, elastic tape used by physical therapist; skin breaks out        Medication List        Accurate as of December 04, 2022  1:16 PM. If you have any questions, ask your nurse or doctor.          acetaminophen 500 MG tablet Commonly known as: TYLENOL Take 1 tablet (500 mg total) by mouth every 6 (six) hours as needed.   albuterol 108 (90 Base) MCG/ACT inhaler Commonly known as: VENTOLIN HFA Inhale 1-2 puffs into the lungs every 6 (six) hours as needed for wheezing or shortness of breath.   diazepam 10 MG tablet Commonly known as: Valium Take one tablet 30 minutes prior to cysto   FLUoxetine 20 MG capsule Commonly known as: PROZAC Take 1 capsule (20 mg total) by mouth daily.   gabapentin 300 MG capsule Commonly known as: Neurontin Take 1 capsule (300 mg total) by mouth 2 (two) times daily.   hydrochlorothiazide 25 MG tablet Commonly known as: HYDRODIURIL Take 25 mg by mouth daily.   HYDROcodone-acetaminophen 7.5-325 MG tablet Commonly known as: NORCO Take 1 tablet by mouth 2 (two) times daily as needed for moderate pain. For chronic  pain.  To fill on or after 01/13/2018, 02/11/2018, 03/13/2018   losartan 25 MG tablet Commonly known as: COZAAR Take 25 mg by mouth daily.   oxyCODONE 5 MG immediate release tablet Commonly known as: Oxy IR/ROXICODONE Take 1 tablet (5 mg total) by mouth every 6 (six) hours as needed for breakthrough pain.   pantoprazole 40 MG tablet Commonly known as: PROTONIX Take 40 mg by mouth daily.   rosuvastatin 20 MG tablet Commonly known as: CRESTOR Take 20 mg by mouth at bedtime.   sildenafil 100 MG tablet Commonly known as: VIAGRA Take 1 tablet (100 mg total) by mouth daily as needed for erectile dysfunction. Take two  hours prior to intercourse on an empty stomach   tamsulosin 0.4 MG Caps capsule Commonly known as: FLOMAX Take 1 capsule (0.4 mg total) by mouth daily.        Allergies:  Allergies  Allergen Reactions   Shellfish Allergy Hives   Shellfish Allergy Hives   Amlodipine Other (See Comments)    Possible hand swelling   Lisinopril Other (See Comments)    Cough   Tape Rash    Manson Passey, elastic tape used by physical therapist; skin breaks out    Family History: Family History  Problem Relation Age of Onset   Heart disease Mother    Colon cancer Mother    Diabetes Father    Prostate cancer Father    Colon cancer Brother    Heart disease Brother    Kidney cancer Neg Hx    Bladder Cancer Neg Hx     Social History:  reports that he has never smoked. He has never used smokeless tobacco. He reports current alcohol use of about 2.0 standard drinks of alcohol per week. He reports that he does not use drugs.  ROS: Pertinent ROS in HPI  Physical Exam: There were no vitals taken for this visit.  Constitutional:  Well nourished. Alert and oriented, No acute distress. HEENT: [place] AT, moist mucus membranes.  Trachea midline, no masses. Cardiovascular: No clubbing, cyanosis, or edema. Respiratory: Normal respiratory effort, no increased work of breathing. GI: Abdomen is soft, non tender, non distended, no abdominal masses. Liver and spleen not palpable.  No hernias appreciated.  Stool sample for occult testing is not indicated.   GU: No CVA tenderness.  No bladder fullness or masses.  Patient with circumcised/uncircumcised phallus. ***Foreskin easily retracted***  Urethral meatus is patent.  No penile discharge. No penile lesions or rashes. Scrotum without lesions, cysts, rashes and/or edema.  Testicles are located scrotally bilaterally. No masses are appreciated in the testicles. Left and right epididymis are normal. Rectal: Patient with  normal sphincter tone. Anus and perineum without scarring  or rashes. No rectal masses are appreciated. Prostate is approximately *** grams, *** nodules are appreciated. Seminal vesicles are normal. Skin: No rashes, bruises or suspicious lesions. Lymph: No cervical or inguinal adenopathy. Neurologic: Grossly intact, no focal deficits, moving all 4 extremities. Psychiatric: Normal mood and affect.   Laboratory Data: Component     Latest Ref Rng 12/02/2022  Prostate Specific Ag, Serum     0.0 - 4.0 ng/mL 2.1   Urinalysis See Epic and HPI I have reviewed the labs.   Pertinent Imaging: N/A  Assessment & Plan:    1. High risk hematuria -smoker -work up in 2019 - NED -no reports of gross heme -UA ***  2. BPH with LU TS -DRE benign -continue tamsulosin 0.4 mg daily   3. ED -have a  trial of sildenafil 100 mg, on-demand-dosing  4. PSA screening -PSA stable  -risk factors of family history and African ancestry -recommend continued screening   No follow-ups on file.  These notes generated with voice recognition software. I apologize for typographical errors.  Cloretta Ned  [facility] [address]  [address] [place], [place] [address] [phone]

## 2022-12-05 ENCOUNTER — Ambulatory Visit: Payer: 59 | Admitting: Urology

## 2022-12-05 DIAGNOSIS — Z125 Encounter for screening for malignant neoplasm of prostate: Secondary | ICD-10-CM

## 2022-12-05 DIAGNOSIS — N401 Enlarged prostate with lower urinary tract symptoms: Secondary | ICD-10-CM

## 2022-12-05 DIAGNOSIS — R319 Hematuria, unspecified: Secondary | ICD-10-CM

## 2022-12-05 DIAGNOSIS — N529 Male erectile dysfunction, unspecified: Secondary | ICD-10-CM

## 2022-12-17 NOTE — Progress Notes (Deleted)
12/20/2022 3:00 PM   Carlos Reynolds [birth year] [id number]  Referring provider: Ailene Ravel, MD [address] [place],  [place] [address]  Urological history: 1. High risk hematuria -smoker -CTU in 10/2017 noted no nephroureterolithiasis. No hydronephrosis. No suspicious enhancing renal mass. No filling defects identified within the opacified renal collecting systems and ureters on delayed images.  Hepatic steatosis.   -Cystoscopy on 11/11/2017 was not completed as patient would not allow the scope to pass.   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 Chronic pelvic pain  3. BPH with LU TS -PSA (11/2022) 2.1  -tamsulosin 0.4 mg daily   4. ED -contributing factors of age, BPH, HTN, alcohol abuse, cocaine use, smoking and pain medication -tadalafil 20 mg, on-demand-dosing  5. Prostate cancer screening -AUA guidelines (2023) recommend screening for men ages 39-69 -African ancestry -father was diagnosed with fatal prostate cancer at age 41  No chief complaint on file.  HPI: Carlos Reynolds is a 67 y.o. male who presents today for yearly visit.  Previous records reviewed.   No acute changes since last visit.       Score:  1-7 Mild 8-19 Moderate 20-35 Severe        Score: 1-7 Severe ED 8-11 Moderate ED 12-16 Mild-Moderate ED 17-21 Mild ED 22-25 No ED   PMH: Past Medical History:  Diagnosis Date   Anxiety    Arthritis    Depression    Heartburn    Hematuria    HLD (hyperlipidemia)    Hypertension    Kidney stone    Pancreatitis 05/20/2017    Surgical History: Past Surgical History:  Procedure Laterality Date   COLONOSCOPY     COLONOSCOPY WITH PROPOFOL N/A 04/15/2017   Procedure: COLONOSCOPY WITH PROPOFOL;  Surgeon: Wyline Mood, MD;  Location[facility] ENDOSCOPY;  Service: Gastroenterology;  Laterality: N/A;   NO PAST SURGERIES     verified with patient on 01/15/17    Home Medications:   Allergies as of 12/20/2022       Reactions   Shellfish Allergy Hives   Shellfish Allergy Hives   Amlodipine Other (See Comments)   Possible hand swelling   Lisinopril Other (See Comments)   Cough   Tape Rash   Brown, elastic tape used by physical therapist; skin breaks out        Medication List        Accurate as of December 17, 2022  3:00 PM. If you have any questions, ask your nurse or doctor.          acetaminophen 500 MG tablet Commonly known as: TYLENOL Take 1 tablet (500 mg total) by mouth every 6 (six) hours as needed.   albuterol 108 (90 Base) MCG/ACT inhaler Commonly known as: VENTOLIN HFA Inhale 1-2 puffs into the lungs every 6 (six) hours as needed for wheezing or shortness of breath.   diazepam 10 MG tablet Commonly known as: Valium Take one tablet 30 minutes prior to cysto   FLUoxetine 20 MG capsule Commonly known as: PROZAC Take 1 capsule (20 mg total) by mouth daily.   gabapentin 300 MG capsule Commonly known as: Neurontin Take 1 capsule (300 mg total) by mouth 2 (two) times daily.   hydrochlorothiazide 25 MG tablet Commonly known as: HYDRODIURIL Take 25 mg by mouth daily.   HYDROcodone-acetaminophen 7.5-325 MG tablet Commonly known as: NORCO Take 1 tablet by mouth 2 (two) times daily as needed for moderate pain. For chronic  pain.  To fill on or after 01/13/2018, 02/11/2018, 03/13/2018   losartan 25 MG tablet Commonly known as: COZAAR Take 25 mg by mouth daily.   oxyCODONE 5 MG immediate release tablet Commonly known as: Oxy IR/ROXICODONE Take 1 tablet (5 mg total) by mouth every 6 (six) hours as needed for breakthrough pain.   pantoprazole 40 MG tablet Commonly known as: PROTONIX Take 40 mg by mouth daily.   rosuvastatin 20 MG tablet Commonly known as: CRESTOR Take 20 mg by mouth at bedtime.   sildenafil 100 MG tablet Commonly known as: VIAGRA Take 1 tablet (100 mg total) by mouth daily as needed for erectile dysfunction. Take  two hours prior to intercourse on an empty stomach   tamsulosin 0.4 MG Caps capsule Commonly known as: FLOMAX Take 1 capsule (0.4 mg total) by mouth daily.        Allergies:  Allergies  Allergen Reactions   Shellfish Allergy Hives   Shellfish Allergy Hives   Amlodipine Other (See Comments)    Possible hand swelling   Lisinopril Other (See Comments)    Cough   Tape Rash    Manson Passey, elastic tape used by physical therapist; skin breaks out    Family History: Family History  Problem Relation Age of Onset   Heart disease Mother    Colon cancer Mother    Diabetes Father    Prostate cancer Father    Colon cancer Brother    Heart disease Brother    Kidney cancer Neg Hx    Bladder Cancer Neg Hx     Social History:  reports that he has never smoked. He has never used smokeless tobacco. He reports current alcohol use of about 2.0 standard drinks of alcohol per week. He reports that he does not use drugs.  ROS: Pertinent ROS in HPI  Physical Exam: There were no vitals taken for this visit.  Constitutional:  Well nourished. Alert and oriented, No acute distress. HEENT: [place] AT, moist mucus membranes.  Trachea midline, no masses. Cardiovascular: No clubbing, cyanosis, or edema. Respiratory: Normal respiratory effort, no increased work of breathing. GI: Abdomen is soft, non tender, non distended, no abdominal masses. Liver and spleen not palpable.  No hernias appreciated.  Stool sample for occult testing is not indicated.   GU: No CVA tenderness.  No bladder fullness or masses.  Patient with circumcised/uncircumcised phallus. ***Foreskin easily retracted***  Urethral meatus is patent.  No penile discharge. No penile lesions or rashes. Scrotum without lesions, cysts, rashes and/or edema.  Testicles are located scrotally bilaterally. No masses are appreciated in the testicles. Left and right epididymis are normal. Rectal: Patient with  normal sphincter tone. Anus and perineum without  scarring or rashes. No rectal masses are appreciated. Prostate is approximately *** grams, *** nodules are appreciated. Seminal vesicles are normal. Skin: No rashes, bruises or suspicious lesions. Lymph: No cervical or inguinal adenopathy. Neurologic: Grossly intact, no focal deficits, moving all 4 extremities. Psychiatric: Normal mood and affect.   Laboratory Data: Component     Latest Ref Rng 12/02/2022  Prostate Specific Ag, Serum     0.0 - 4.0 ng/mL 2.1   Urinalysis See Epic and HPI I have reviewed the labs.   Pertinent Imaging: N/A  Assessment & Plan:    1. High risk hematuria -smoker -work up in 2019 - NED -no reports of gross heme -UA ***  2. BPH with LU TS -DRE benign -continue tamsulosin 0.4 mg daily   3. ED -have a  trial of sildenafil 100 mg, on-demand-dosing  4. PSA screening -PSA stable  -risk factors of family history and African ancestry -recommend continued screening   No follow-ups on file.  These notes generated with voice recognition software. I apologize for typographical errors.  Cloretta Ned  [facility] [address]  [address] [place], [place] [address] [phone]

## 2022-12-20 ENCOUNTER — Ambulatory Visit: Payer: 59 | Admitting: Urology

## 2023-04-09 ENCOUNTER — Telehealth: Payer: Self-pay | Admitting: Urology

## 2023-04-09 NOTE — Telephone Encounter (Signed)
Patient called to request refill for Tamsulosin. He said pharmacy told him to contact office. Pharmacy is [facility] in [place]. He hasn't been seen for office appt since 2023. He did have labs done in 6/24. He has not had insurance coverage.

## 2023-04-09 NOTE — Telephone Encounter (Signed)
N/a both numbers.   Pt needs an appt for a refill.   LV 10/23.

## 2023-04-14 NOTE — Telephone Encounter (Signed)
LM informing pt to contact office for an appt.

## 2023-06-01 NOTE — Progress Notes (Unsigned)
 06/03/2023 10:17 AM   Carlos Reynolds [birth year] [id number]  Referring provider: Ailene Ravel, MD [address] [place],  [place] [address]  Urological history: 1. High risk hematuria -smoker -CTU in 10/2017 noted no nephroureterolithiasis. No hydronephrosis. No suspicious enhancing renal mass. No filling defects identified within the opacified renal collecting systems and ureters on delayed images.  Hepatic steatosis.   -Cystoscopy on 11/11/2017 was not completed as patient would not allow the scope to pass.   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 Chronic pelvic pain   3. BPH with LU TS -PSA (11/2022) 2.1  -tamsulosin 0.4 mg daily    4. ED -contributing factors of age, BPH, HTN, alcohol abuse, cocaine use, smoking and pain medication -tadalafil 20 mg, on-demand-dosing   5. Prostate cancer screening -AUA guidelines (2023) recommend screening for men ages 14-69 -African ancestry -father was diagnosed with fatal prostate cancer at age 51  Chief Complaint  Patient presents with   Follow-up   HPI: Carlos Reynolds is a 68 y.o. male who presents today for follow up.   Previous records reviewed.   I PSS 25/5  He states he has been without his Flomax for the month and a half.  He states that is when his urinary symptoms were significantly worsened.  He also had an episode of gross hematuria 3 weeks ago.  He stated he had the urge to void and he manually squeezed his penis to hold the urine back and when he urinated the urine was brown.  Patient denies any modifying or aggravating factors.  Patient denies any recent UTI's, dysuria or suprapubic/flank pain.  Patient denies any fevers, chills, nausea or vomiting.    UA yellow slightly mildly, specific gravity 1.025, pH 6.0, 1+ leukocyte, 11-30 WBCs, 0-2 RBCs, 0-10 epithelial cells, mucus threads present, few bacteria and trichomonas present   IPSS     Row Name  06/03/23 0900         International Prostate Symptom Score   How often have you had the sensation of not emptying your bladder? About half the time     How often have you had to urinate less than every two hours? Less than half the time     How often have you found you stopped and started again several times when you urinated? About half the time     How often have you found it difficult to postpone urination? Almost always     How often have you had a weak urinary stream? Almost always     How often have you had to strain to start urination? More than half the time     How many times did you typically get up at night to urinate? 3 Times     Total IPSS Score 25       Quality of Life due to urinary symptoms   If you were to spend the rest of your life with your urinary condition just the way it is now how would you feel about that? Unhappy              Score:  1-7 Mild 8-19 Moderate 20-35 Severe    SHIM 16  He has not been sexually active in over 2 years.  He states PDE 5 inhibitors did not work for him.  He is not interested in any other treatment modalities.   SHIM     Row Name 06/03/23 [phone]  SHIM: Over the last 6 months:   How do you rate your confidence that you could get and keep an erection? Moderate     When you had erections with sexual stimulation, how often were your erections hard enough for penetration (entering your partner)? Sometimes (about half the time)     During sexual intercourse, how often were you able to maintain your erection after you had penetrated (entered) your partner? Sometimes (about half the time)     During sexual intercourse, how difficult was it to maintain your erection to completion of intercourse? Difficult     When you attempted sexual intercourse, how often was it satisfactory for you? Most Times (much more than half the time)       SHIM Total Score   SHIM 16              Score: 1-7 Severe ED 8-11 Moderate  ED 12-16 Mild-Moderate ED 17-21 Mild ED 22-25 No ED   PMH: Past Medical History:  Diagnosis Date   Anxiety    Arthritis    Depression    Heartburn    Hematuria    HLD (hyperlipidemia)    Hypertension    Kidney stone    Pancreatitis 05/20/2017    Surgical History: Past Surgical History:  Procedure Laterality Date   COLONOSCOPY     COLONOSCOPY WITH PROPOFOL N/A 04/15/2017   Procedure: COLONOSCOPY WITH PROPOFOL;  Surgeon: Wyline Mood, MD;  Location[facility] END[facility]COPY;  Service: Gastroenterology;  Laterality: N/A;   NO PAST SURGERIES     verified with patient on 01/15/17    Home Medications:  Allergies as of 06/03/2023       Reactions   Shellfish Allergy Hives   Shellfish Allergy Hives   Amlodipine Other (See Comments)   Possible hand swelling   Lisinopril Other (See Comments)   Cough   Tape Rash   Brown, elastic tape used by physical therapist; skin breaks out        Medication List        Accurate as of June 03, 2023 10:17 AM. If you have any questions, ask your nurse or doctor.          STOP taking these medications    diazepam 10 MG tablet Commonly known as: Valium Stopped by: Michiel Cowboy   HYDROcodone-acetaminophen 7.5-325 MG tablet Commonly known as: NORCO Stopped by: Rei Medlen   oxyCODONE 5 MG immediate release tablet Commonly known as: Oxy IR/ROXICODONE Stopped by: Casondra Gasca   rosuvastatin 20 MG tablet Commonly known as: CRESTOR Stopped by: Yordi Krager   sildenafil 100 MG tablet Commonly known as: VIAGRA Stopped by: Carrol Hougland       TAKE these medications    acetaminophen 500 MG tablet Commonly known as: TYLENOL Take 1 tablet (500 mg total) by mouth every 6 (six) hours as needed.   albuterol 108 (90 Base) MCG/ACT inhaler Commonly known as: VENTOLIN HFA Inhale 1-2 puffs into the lungs every 6 (six) hours as needed for wheezing or shortness of breath.   FLUoxetine 20 MG capsule Commonly known as:  PROZAC Take 1 capsule (20 mg total) by mouth daily.   gabapentin 300 MG capsule Commonly known as: Neurontin Take 1 capsule (300 mg total) by mouth 2 (two) times daily.   hydrochlorothiazide 25 MG tablet Commonly known as: HYDRODIURIL Take 25 mg by mouth daily.   losartan 25 MG tablet Commonly known as: COZAAR Take 25 mg by mouth daily.   metroNIDAZOLE 500 MG  tablet Commonly known as: Flagyl Take four tablets at once to 2 grams of metronidazole Started by: Carollee Herter Lavon Bothwell   pantoprazole 40 MG tablet Commonly known as: PROTONIX Take 40 mg by mouth daily.   tamsulosin 0.4 MG Caps capsule Commonly known as: FLOMAX Take 1 capsule (0.4 mg total) by mouth daily.        Allergies:  Allergies  Allergen Reactions   Shellfish Allergy Hives   Shellfish Allergy Hives   Amlodipine Other (See Comments)    Possible hand swelling   Lisinopril Other (See Comments)    Cough   Tape Rash    Manson Passey, elastic tape used by physical therapist; skin breaks out    Family History: Family History  Problem Relation Age of Onset   Heart disease Mother    Colon cancer Mother    Diabetes Father    Prostate cancer Father    Colon cancer Brother    Heart disease Brother    Kidney cancer Neg Hx    Bladder Cancer Neg Hx     Social History:  reports that he has never smoked. He has never used smokeless tobacco. He reports current alcohol use of about 2.0 standard drinks of alcohol per week. He reports that he does not use drugs.  ROS: Pertinent ROS in HPI  Physical Exam: BP (!) 158/91   Pulse 77   Ht 5\' 10"  (1.778 m)   Wt 216 lb (98 kg)   BMI 30.99 kg/m   Constitutional:  Well nourished. Alert and oriented, No acute distress. HEENT: [place] AT, moist mucus membranes.  Trachea midline Cardiovascular: No clubbing, cyanosis, or edema. Respiratory: Normal respiratory effort, no increased work of breathing. GU: Patient refused Rectal: Patient refused  Neurologic: Grossly intact, no focal  deficits, moving all 4 extremities. Psychiatric: Normal mood and affect.  Laboratory Data: CBC w/ Differential Order: [id number] Component Ref Range & Units 5 mo ago  WBC 3.6 - 11.2 10*9/L 12.8 High   RBC 4.26 - 5.60 10*12/L 4.72  HGB 12.9 - 16.5 g/dL 40.9  HCT 81.1 - 91.4 % 43.6  MCV 77.6 - 95.7 fL 92.3  MCH 25.9 - 32.4 pg 30.8  MCHC 32.0 - 36.0 g/dL 78.2  RDW 95.6 - 21.3 % 14.3  MPV 6.8 - 10.7 fL 7.2  Platelet 150 - 450 10*9/L 246  nRBC <=4 /100 WBCs 0  Neutrophils % % 77.9  Lymphocytes % % 10.5  Monocytes % % 8.7  Eosinophils % % 2.6  Basophils % % 0.3  Absolute Neutrophils 1.8 - 7.8 10*9/L 10 High   Absolute Lymphocytes 1.1 - 3.6 10*9/L 1.3  Absolute Monocytes 0.3 - 0.8 10*9/L 1.1 High   Absolute Eosinophils 0.0 - 0.5 10*9/L 0.3  Absolute Basophils 0.0 - 0.1 10*9/L 0  Resulting Agency [facility]   Specimen Collected: 12/24/22 15:56   Performed by: [facility] H[facility] LAB[facility] Last Resulted: 12/24/22 16:03  Received From: [facility]  Result Received: 01/19/23 20:17   tains abnormal data Comprehensive metabolic panel Order: [id number] Component Ref Range & Units 5 mo ago  Sodium 135 - 145 mmol/L 134 Low   Potassium 3.5 - 5.0 mmol/L 3.7  Chloride 98 - 107 mmol/L 100  CO2 22.0 - 32.0 mmol/L 27  Anion Gap 7 - 15 mmol/L 7  BUN 7 - 21 mg/dL 16  Creatinine 6.29 - 5.28 mg/dL 1.1  BUN/Creatinine Ratio 15  eGFR CKD-EPI (2021) Male >=60 mL/min/1.9m2 74  Comment: eGFR calculated with CKD-EPI  2021 equation in accordance with [employer] and [employer] of Nephrology Task Force recommendations.  Glucose 74 - 106 mg/dL 696 High   Calcium 8.5 - 10.2 mg/dL 8.9  Albumin 3.4 - 5.0 g/dL 4.1  Total Protein 6.5 - 8.3 g/dL 7.3  Total Bilirubin 0.1 - 1.2 mg/dL 0.9  AST 19 - 55 U/L 38  ALT <50 U/L 22  Alkaline Phosphatase 38 - 126 U/L 66  Resulting Agency [facility] LABORATORY    Specimen Collected: 12/24/22 15:56   Performed by: [facility] LABORATORY Last Resulted: 12/24/22 16:36  Received From: [facility]  Result Received: 01/19/23 20:17   Urinalysis See EPIC and HPI  I have reviewed the labs.   Pertinent Imaging: N/A  Assessment & Plan:    1. Trichomonas infection -metronidazole 2 grams today-script sent to pharmacy -follow up in 3 weeks to 3 months to recheck to ensure resolution -advised to contact sexual partners   2. High risk hematuria -smoker -work up 2019, dysmorphic RBC's, refused nephrology referral -no reports of gross heme -UA negative for micro heme   3. BPH with LU TS -PSA stable  -continue tamsulosin 0.4 mg daily  4. ED -Failed PDE 5 inhibitors - We discussed ICI or referral for IPP, he declined both  5. Gross hematuria - May be secondary to trichomonas infection or the trauma caused by him manually squeezing his penis, but will need to continue to monitor - Today's UA without micro heme - Return in 1 month for repeat UA  Return in about 1 month (around 07/03/2023) for UA, I PSS, PVR .  These notes generated with voice recognition software. I apologize for typographical errors.  Cloretta Ned  [facility] [address]  [address] [place], [place] [address] [phone]

## 2023-06-03 ENCOUNTER — Ambulatory Visit (INDEPENDENT_AMBULATORY_CARE_PROVIDER_SITE_OTHER): Admitting: Urology

## 2023-06-03 ENCOUNTER — Encounter: Payer: Self-pay | Admitting: Urology

## 2023-06-03 VITALS — BP 158/91 | HR 77 | Ht 70.0 in | Wt 216.0 lb

## 2023-06-03 DIAGNOSIS — N138 Other obstructive and reflux uropathy: Secondary | ICD-10-CM

## 2023-06-03 DIAGNOSIS — N401 Enlarged prostate with lower urinary tract symptoms: Secondary | ICD-10-CM | POA: Diagnosis not present

## 2023-06-03 DIAGNOSIS — N529 Male erectile dysfunction, unspecified: Secondary | ICD-10-CM | POA: Diagnosis not present

## 2023-06-03 DIAGNOSIS — R310 Gross hematuria: Secondary | ICD-10-CM | POA: Diagnosis not present

## 2023-06-03 DIAGNOSIS — R319 Hematuria, unspecified: Secondary | ICD-10-CM | POA: Diagnosis not present

## 2023-06-03 DIAGNOSIS — A599 Trichomoniasis, unspecified: Secondary | ICD-10-CM | POA: Diagnosis not present

## 2023-06-03 LAB — URINALYSIS, COMPLETE
Bilirubin, UA: NEGATIVE
Glucose, UA: NEGATIVE
Ketones, UA: NEGATIVE
Nitrite, UA: NEGATIVE
Protein,UA: NEGATIVE
RBC, UA: NEGATIVE
Specific Gravity, UA: 1.025 (ref 1.005–1.030)
Urobilinogen, Ur: 0.2 mg/dL (ref 0.2–1.0)
pH, UA: 6 (ref 5.0–7.5)

## 2023-06-03 LAB — MICROSCOPIC EXAMINATION

## 2023-06-03 MED ORDER — METRONIDAZOLE 500 MG PO TABS: ORAL_TABLET | ORAL | 0 refills | Status: AC

## 2023-06-03 MED ORDER — TAMSULOSIN HCL 0.4 MG PO CAPS: 0.4000 mg | ORAL_CAPSULE | Freq: Every day | ORAL | 4 refills | Status: AC

## 2023-06-17 DIAGNOSIS — H6123 Impacted cerumen, bilateral: Secondary | ICD-10-CM | POA: Diagnosis not present

## 2023-06-17 DIAGNOSIS — H9202 Otalgia, left ear: Secondary | ICD-10-CM | POA: Diagnosis not present

## 2023-06-30 DIAGNOSIS — H6123 Impacted cerumen, bilateral: Secondary | ICD-10-CM | POA: Diagnosis not present

## 2023-07-03 ENCOUNTER — Encounter: Payer: Self-pay | Admitting: Urology

## 2023-07-03 ENCOUNTER — Ambulatory Visit (INDEPENDENT_AMBULATORY_CARE_PROVIDER_SITE_OTHER): Admitting: Urology

## 2023-07-03 VITALS — BP 144/87 | HR 73 | Ht 70.0 in | Wt 217.0 lb

## 2023-07-03 DIAGNOSIS — R310 Gross hematuria: Secondary | ICD-10-CM | POA: Diagnosis not present

## 2023-07-03 DIAGNOSIS — N138 Other obstructive and reflux uropathy: Secondary | ICD-10-CM

## 2023-07-03 DIAGNOSIS — K4091 Unilateral inguinal hernia, without obstruction or gangrene, recurrent: Secondary | ICD-10-CM

## 2023-07-03 DIAGNOSIS — A599 Trichomoniasis, unspecified: Secondary | ICD-10-CM

## 2023-07-03 DIAGNOSIS — N401 Enlarged prostate with lower urinary tract symptoms: Secondary | ICD-10-CM | POA: Diagnosis not present

## 2023-07-03 DIAGNOSIS — R351 Nocturia: Secondary | ICD-10-CM | POA: Diagnosis not present

## 2023-07-03 MED ORDER — TROSPIUM CHLORIDE 20 MG PO TABS
20.0000 mg | ORAL_TABLET | Freq: Every day | ORAL | 3 refills | Status: DC
Start: 2023-07-03 — End: 2023-08-05

## 2023-07-03 MED ORDER — NONFORMULARY OR COMPOUNDED ITEM: 3 refills | Status: DC

## 2023-07-03 NOTE — Progress Notes (Signed)
 07/03/2023 4:16 PM   Carlos Reynolds February 15, 1956 [id number]  Referring provider: Annette Barters, MD [address] [place],  [place] [address]  Urological history: 1. High risk hematuria -smoker -CTU in 10/2017 noted no nephroureterolithiasis. No hydronephrosis. No suspicious enhancing renal mass. No filling defects identified within the opacified renal collecting systems and ureters on delayed images.  Hepatic steatosis.   -Cystoscopy on 11/11/2017 was not completed as patient would not allow the scope to pass.   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 Chronic pelvic pain   3. BPH with LU TS -PSA (11/2022) 2.1  -tamsulosin  0.4 mg daily    4. ED -contributing factors of age, BPH, HTN, alcohol abuse, cocaine use, smoking and pain medication -tadalafil  20 mg, on-demand-dosing   5. Prostate cancer screening -AUA guidelines (2023) recommend screening for men ages 71-69 -African ancestry -father was diagnosed with fatal prostate cancer at age 36  Chief Complaint  Patient presents with   Follow-up   HPI: Carlos Reynolds is a 68 y.o. male who presents today for 1 month follow-up follow up after restarting his tamsulosin  0.4 mg daily and recheck on UA to ensure his trichomonas infection has been cleared.  Previous records reviewed.   At his visit on 06/03/2023, I PSS 25/5.  He states he has been without his Flomax  for the month and a half.  He states that is when his urinary symptoms were significantly worsened.  He also had an episode of gross hematuria 3 weeks ago.  He stated he had the urge to void and he manually squeezed his penis to hold the urine back and when he urinated the urine was brown.   Patient denies any modifying or aggravating factors.  Patient denies any recent UTI's, dysuria or suprapubic/flank pain.  Patient denies any fevers, chills, nausea or vomiting.   UA yellow slightly mildly, specific gravity 1.025,  pH 6.0, 1+ leukocyte, 11-30 WBCs, 0-2 RBCs, 0-10 epithelial cells, mucus threads present, few bacteria and trichomonas present.  We refilled his tamsulosin  and gave him 2 g of Flagyl .    He states he took the 4 pills of Flagyl  as prescribed and he is taking the tamsulosin  0.4 mg daily.   He is unable to give us  a urine specimen today as he states he needs beer to void.   He had an issue 2 weeks ago where he had a sudden onset of left lower quadrant pain that was very intense and lasted for about a week.  It has spontaneous abated at this time.     He is also having issues with nocturia x 2-3.    Patient denies any modifying or aggravating factors.  Patient denies any recent UTI's, gross hematuria, dysuria or suprapubic/flank pain.  Patient denies any fevers, chills, nausea or vomiting.       IPSS     Row Name 07/03/23 1400         International Prostate Symptom Score   How often have you had the sensation of not emptying your bladder? About half the time     How often have you had to urinate less than every two hours? Less than half the time     How often have you found you stopped and started again several times when you urinated? About half the time     How often have you found it difficult to postpone urination? Less than half the time  How often have you had a weak urinary stream? More than half the time     How often have you had to strain to start urination? More than half the time     How many times did you typically get up at night to urinate? 2 Times     Total IPSS Score 20       Quality of Life due to urinary symptoms   If you were to spend the rest of your life with your urinary condition just the way it is now how would you feel about that? Unhappy              Score:  1-7 Mild 8-19 Moderate 20-35 Severe   PMH: Past Medical History:  Diagnosis Date   Anxiety    Arthritis    Depression    Heartburn    Hematuria    HLD (hyperlipidemia)    Hypertension     Kidney stone    Pancreatitis 05/20/2017    Surgical History: Past Surgical History:  Procedure Laterality Date   COLONOSCOPY     COLONOSCOPY WITH PROPOFOL  N/A 04/15/2017   Procedure: COLONOSCOPY WITH PROPOFOL ;  Surgeon: Luke Salaam, MD;  Location[facility] ENDOSCOPY;  Service: Gastroenterology;  Laterality: N/A;   NO PAST SURGERIES     verified with patient on 01/15/17    Home Medications:  Allergies as of 07/03/2023       Reactions   Shellfish Allergy Hives   Shellfish Allergy Hives   Amlodipine Other (See Comments)   Possible hand swelling   Lisinopril Other (See Comments)   Cough   Tape Rash   Brown, elastic tape used by physical therapist; skin breaks out        Medication List        Accurate as of Jul 03, 2023  4:16 PM. If you have any questions, ask your nurse or doctor.          acetaminophen  500 MG tablet Commonly known as: TYLENOL  Take 1 tablet (500 mg total) by mouth every 6 (six) hours as needed.   albuterol  108 (90 Base) MCG/ACT inhaler Commonly known as: VENTOLIN  HFA Inhale 1-2 puffs into the lungs every 6 (six) hours as needed for wheezing or shortness of breath.   FLUoxetine  20 MG capsule Commonly known as: PROZAC  Take 1 capsule (20 mg total) by mouth daily.   gabapentin  300 MG capsule Commonly known as: Neurontin  Take 1 capsule (300 mg total) by mouth 2 (two) times daily.   hydrochlorothiazide  25 MG tablet Commonly known as: HYDRODIURIL  Take 25 mg by mouth daily.   losartan 25 MG tablet Commonly known as: COZAAR Take 25 mg by mouth daily.   metroNIDAZOLE  500 MG tablet Commonly known as: Flagyl  Take four tablets at once to 2 grams of metronidazole    pantoprazole  40 MG tablet Commonly known as: PROTONIX  Take 40 mg by mouth daily.   tamsulosin  0.4 MG Caps capsule Commonly known as: FLOMAX  Take 1 capsule (0.4 mg total) by mouth daily.   trospium 20 MG tablet Commonly known as: SANCTURA Take 1 tablet (20 mg total) by mouth at  bedtime.        Allergies:  Allergies  Allergen Reactions   Shellfish Allergy Hives   Shellfish Allergy Hives   Amlodipine Other (See Comments)    Possible hand swelling   Lisinopril Other (See Comments)    Cough   Tape Rash    Brown, elastic tape used by physical therapist; skin breaks  out    Family History: Family History  Problem Relation Age of Onset   Heart disease Mother    Colon cancer Mother    Diabetes Father    Prostate cancer Father    Colon cancer Brother    Heart disease Brother    Kidney cancer Neg Hx    Bladder Cancer Neg Hx     Social History:  reports that he has never smoked. He has never used smokeless tobacco. He reports current alcohol use of about 2.0 standard drinks of alcohol per week. He reports that he does not use drugs.  ROS: Pertinent ROS in HPI  Physical Exam: BP (!) 144/87   Pulse 73   Ht 5\' 10"  (1.778 m)   Wt 217 lb (98.4 kg)   BMI 31.14 kg/m   Constitutional:  Well nourished. Alert and oriented, No acute distress. HEENT: [place] AT, moist mucus membranes.  Trachea midline, no masses. Cardiovascular: No clubbing, cyanosis, or edema. Respiratory: Normal respiratory effort, no increased work of breathing. GI: Abdomen is soft, non tender, non distended, no abdominal masses.  Liver and spleen not palpable.  An indirect reducible left-sided hernia is appreciated.  Stool sample for occult testing is not indicated.   Neurologic: Grossly intact, no focal deficits, moving all 4 extremities. Psychiatric: Normal mood and affect.   Laboratory Data: N/A   Pertinent Imaging: N/A  Assessment & Plan:    1. Trichomonas infection - Patient cannot provide a urine specimen today to recheck the urine to ensure the triggering illness has cleared - He will come prepared next month to give a specimen so that we can check to ensure the infection is cleared  2. High risk hematuria -smoker -work up 2019, dysmorphic RBC's, refused nephrology  referral -no reports of gross heme   3. BPH with LU TS -PSA stable  -continue tamsulosin  0.4 mg daily  4. Gross hematuria - May be secondary to trichomonas infection or the trauma caused by him manually squeezing his penis, but will need to continue to monitor - Return in 1 month for repeat UA  5.  Left inguinal hernia - Offered referral to general surgery, but he deferred - Discussed red flag signs  6. Nocturia - Prescribed [place] IR 20 mg nightly - He will return in 1 month for reevaluation  Return in about 1 month (around 08/03/2023) for UA, I PSS, PVR .  These notes generated with voice recognition software. I apologize for typographical errors.  Briant Camper  [facility] [address]  [address] [place], [place] [address] [phone]

## 2023-07-04 LAB — MICROSCOPIC EXAMINATION

## 2023-07-09 LAB — URINALYSIS, COMPLETE
Bilirubin, UA: NEGATIVE
Glucose, UA: NEGATIVE
Ketones, UA: NEGATIVE
Leukocytes,UA: NEGATIVE
Nitrite, UA: NEGATIVE
Specific Gravity, UA: 1.03 (ref 1.005–1.030)
Urobilinogen, Ur: 0.2 mg/dL (ref 0.2–1.0)
pH, UA: 6 (ref 5.0–7.5)

## 2023-07-09 LAB — MICROSCOPIC EXAMINATION: Epithelial Cells (non renal): >10 /HPF — AB (ref 0–10)

## 2023-08-04 NOTE — Progress Notes (Unsigned)
 08/05/2023 3:47 PM   Carlos Reynolds [birth year] [id number]  Referring provider: Annette Barters, MD [address] [place],  [place] [address]  Urological history: 1. High risk hematuria -smoker -CTU in 10/2017 noted no nephroureterolithiasis. No hydronephrosis. No suspicious enhancing renal mass. No filling defects identified within the opacified renal collecting systems and ureters on delayed images.  Hepatic steatosis.   -Cystoscopy on 11/11/2017 was not completed as patient would not allow the scope to pass.   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 Chronic pelvic pain   3. BPH with LU TS -PSA (11/2022) 2.1  -tamsulosin  0.4 mg daily    4. ED -contributing factors of age, BPH, HTN, alcohol abuse, cocaine use, smoking and pain medication -tadalafil  20 mg, on-demand-dosing   5. Prostate cancer screening -AUA guidelines (2023) recommend screening for men ages 21-69 -African ancestry -father was diagnosed with fatal prostate cancer at age 2  Chief Complaint  Patient presents with   Other   HPI: Carlos Reynolds is a 68 y.o. male who presents today for 1 month follow-up.    Previous records reviewed.   I PSS 14/5  PVR 0 mL  He did not fill the trospium  prescription as it was going to cost him $80 a month.  He is still having issues with nocturia.  Patient denies any modifying or aggravating factors.  Patient denies any recent UTI's, gross hematuria, dysuria or suprapubic/flank pain.  Patient denies any fevers, chills, nausea or vomiting.     IPSS     Row Name 08/05/23 1500         International Prostate Symptom Score   How often have you had the sensation of not emptying your bladder? Less than half the time     How often have you had to urinate less than every two hours? Less than 1 in 5 times     How often have you found you stopped and started again several times when you urinated? Less than 1 in 5 times      How often have you found it difficult to postpone urination? Almost always     How often have you had a weak urinary stream? About half the time     How often have you had to strain to start urination? Not at All     How many times did you typically get up at night to urinate? 2 Times     Total IPSS Score 14       Quality of Life due to urinary symptoms   If you were to spend the rest of your life with your urinary condition just the way it is now how would you feel about that? Unhappy               Score:  1-7 Mild 8-19 Moderate 20-35 Severe   PMH: Past Medical History:  Diagnosis Date   Anxiety    Arthritis    Depression    Heartburn    Hematuria    HLD (hyperlipidemia)    Hypertension    Kidney stone    Pancreatitis 05/20/2017    Surgical History: Past Surgical History:  Procedure Laterality Date   COLONOSCOPY     COLONOSCOPY WITH PROPOFOL  N/A 04/15/2017   Procedure: COLONOSCOPY WITH PROPOFOL ;  Surgeon: Luke Salaam, MD;  Location: [facility] ENDOSCOPY;  Service: Gastroenterology;  Laterality: N/A;   NO PAST SURGERIES     verified with patient on  01/15/17    Home Medications:  Allergies as of 08/05/2023       Reactions   Shellfish Allergy Hives   Shellfish Allergy Hives   Amlodipine Other (See Comments)   Possible hand swelling   Lisinopril Other (See Comments)   Cough   Tape Rash   Brown, elastic tape used by physical therapist; skin breaks out        Medication List        Accurate as of August 05, 2023  3:47 PM. If you have any questions, ask your nurse or doctor.          STOP taking these medications    trospium  20 MG tablet Commonly known as: SANCTURA  Stopped by: Mertis Mosher       TAKE these medications    acetaminophen  500 MG tablet Commonly known as: TYLENOL  Take 1 tablet (500 mg total) by mouth every 6 (six) hours as needed.   albuterol  108 (90 Base) MCG/ACT inhaler Commonly known as: VENTOLIN  HFA Inhale 1-2 puffs into  the lungs every 6 (six) hours as needed for wheezing or shortness of breath.   FLUoxetine  20 MG capsule Commonly known as: PROZAC  Take 1 capsule (20 mg total) by mouth daily.   gabapentin  300 MG capsule Commonly known as: Neurontin  Take 1 capsule (300 mg total) by mouth 2 (two) times daily.   hydrochlorothiazide  25 MG tablet Commonly known as: HYDRODIURIL  Take 25 mg by mouth daily.   losartan 25 MG tablet Commonly known as: COZAAR Take 25 mg by mouth daily.   metroNIDAZOLE  500 MG tablet Commonly known as: Flagyl  Take four tablets at once to 2 grams of metronidazole    oxybutynin 15 MG 24 hr tablet Commonly known as: DITROPAN XL Take 1 tablet (15 mg total) by mouth daily. Started by: Matilde Son   pantoprazole  40 MG tablet Commonly known as: PROTONIX  Take 40 mg by mouth daily.   tamsulosin  0.4 MG Caps capsule Commonly known as: FLOMAX  Take 1 capsule (0.4 mg total) by mouth daily.        Allergies:  Allergies  Allergen Reactions   Shellfish Allergy Hives   Shellfish Allergy Hives   Amlodipine Other (See Comments)    Possible hand swelling   Lisinopril Other (See Comments)    Cough   Tape Rash    Bevin Bucks, elastic tape used by physical therapist; skin breaks out    Family History: Family History  Problem Relation Age of Onset   Heart disease Mother    Colon cancer Mother    Diabetes Father    Prostate cancer Father    Colon cancer Brother    Heart disease Brother    Kidney cancer Neg Hx    Bladder Cancer Neg Hx     Social History:  reports that he has never smoked. He has never used smokeless tobacco. He reports current alcohol use of about 2.0 standard drinks of alcohol per week. He reports that he does not use drugs.  ROS: Pertinent ROS in HPI  Physical Exam: BP (!) 152/84   Pulse (!) 42   Ht 5\' 11"  (1.803 m)   Wt 211 lb (95.7 kg)   BMI 29.43 kg/m   Constitutional:  Well nourished. Alert and oriented, No acute distress. HEENT: [place] AT, moist  mucus membranes.  Trachea midline Cardiovascular: No clubbing, cyanosis, or edema. Respiratory: Normal respiratory effort, no increased work of breathing. Neurologic: Grossly intact, no focal deficits, moving all 4 extremities. Psychiatric: Normal mood and affect.  Laboratory Data: N/A   Pertinent Imaging: N/A  Assessment & Plan:    1. BPH with LU TS -PSA stable  -continue tamsulosin  0.4 mg daily  2. Nocturia - have a trial of oxybutynin XL 15 mg daily  - He will return in 12 weeks for reevaluation  No follow-ups on file.  These notes generated with voice recognition software. I apologize for typographical errors.  Briant Camper  [facility] [address]  [address] [place], [place] [address] [phone]

## 2023-08-05 ENCOUNTER — Encounter: Payer: Self-pay | Admitting: Urology

## 2023-08-05 ENCOUNTER — Ambulatory Visit (INDEPENDENT_AMBULATORY_CARE_PROVIDER_SITE_OTHER): Admitting: Urology

## 2023-08-05 VITALS — BP 152/84 | HR 42 | Ht 71.0 in | Wt 211.0 lb

## 2023-08-05 DIAGNOSIS — R351 Nocturia: Secondary | ICD-10-CM | POA: Diagnosis not present

## 2023-08-05 DIAGNOSIS — K4091 Unilateral inguinal hernia, without obstruction or gangrene, recurrent: Secondary | ICD-10-CM

## 2023-08-05 DIAGNOSIS — N138 Other obstructive and reflux uropathy: Secondary | ICD-10-CM

## 2023-08-05 DIAGNOSIS — A599 Trichomoniasis, unspecified: Secondary | ICD-10-CM

## 2023-08-05 DIAGNOSIS — N401 Enlarged prostate with lower urinary tract symptoms: Secondary | ICD-10-CM

## 2023-08-05 DIAGNOSIS — R310 Gross hematuria: Secondary | ICD-10-CM

## 2023-08-05 LAB — BLADDER SCAN AMB NON-IMAGING

## 2023-08-05 MED ORDER — OXYBUTYNIN CHLORIDE ER 15 MG PO TB24: 15.0000 mg | ORAL_TABLET | Freq: Every day | ORAL | 2 refills | Status: DC

## 2023-10-27 NOTE — Progress Notes (Deleted)
 10/28/2023 6:59 PM   Carlos Reynolds 1955/02/27 [id number]  Referring provider: Stephanie Charlene CROME, MD [address] [place],  [place] [address]  Urological history: 1. High risk hematuria -smoker -CTU in 10/2017 noted no nephroureterolithiasis. No hydronephrosis. No suspicious enhancing renal mass. No filling defects identified within the opacified renal collecting systems and ureters on delayed images.  Hepatic steatosis.   -Cystoscopy on 11/11/2017 was not completed as patient would not allow the scope to pass.   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 Chronic pelvic pain   3. BPH with LU TS -PSA (11/2022) 2.1  -tamsulosin  0.4 mg daily    4. ED -contributing factors of age, BPH, HTN, alcohol abuse, cocaine use, smoking and pain medication -tadalafil  20 mg, on-demand-dosing   5. Prostate cancer screening -AUA guidelines (2023) recommend screening for men ages 22-69 -African ancestry -father was diagnosed with fatal prostate cancer at age 65  No chief complaint on file.  HPI: Carlos Reynolds is a 68 y.o. male who presents today after a trail of oxybutynin  XL 15 mg daily.    Previous records reviewed.   PVR ***  PMH: Past Medical History:  Diagnosis Date   Anxiety    Arthritis    Depression    Heartburn    Hematuria    HLD (hyperlipidemia)    Hypertension    Kidney stone    Pancreatitis 05/20/2017    Surgical History: Past Surgical History:  Procedure Laterality Date   COLONOSCOPY     COLONOSCOPY WITH PROPOFOL  N/A 04/15/2017   Procedure: COLONOSCOPY WITH PROPOFOL ;  Surgeon: Therisa Bi, MD;  Location: [facility] ENDOSCOPY;  Service: Gastroenterology;  Laterality: N/A;   NO PAST SURGERIES     verified with patient on 01/15/17    Home Medications:  Allergies as of 10/28/2023       Reactions   Shellfish Allergy Hives   Shellfish Allergy Hives   Amlodipine Other (See Comments)   Possible hand swelling    Lisinopril Other (See Comments)   Cough   Tape Rash   Brown, elastic tape used by physical therapist; skin breaks out        Medication List        Accurate as of October 27, 2023  6:59 PM. If you have any questions, ask your nurse or doctor.          acetaminophen  500 MG tablet Commonly known as: TYLENOL  Take 1 tablet (500 mg total) by mouth every 6 (six) hours as needed.   albuterol  108 (90 Base) MCG/ACT inhaler Commonly known as: VENTOLIN  HFA Inhale 1-2 puffs into the lungs every 6 (six) hours as needed for wheezing or shortness of breath.   FLUoxetine  20 MG capsule Commonly known as: PROZAC  Take 1 capsule (20 mg total) by mouth daily.   gabapentin  300 MG capsule Commonly known as: Neurontin  Take 1 capsule (300 mg total) by mouth 2 (two) times daily.   hydrochlorothiazide  25 MG tablet Commonly known as: HYDRODIURIL  Take 25 mg by mouth daily.   losartan 25 MG tablet Commonly known as: COZAAR Take 25 mg by mouth daily.   metroNIDAZOLE  500 MG tablet Commonly known as: Flagyl  Take four tablets at once to 2 grams of metronidazole    oxybutynin  15 MG 24 hr tablet Commonly known as: DITROPAN  XL Take 1 tablet (15 mg total) by mouth daily.   pantoprazole  40 MG tablet Commonly known as: PROTONIX  Take 40 mg by mouth daily.  tamsulosin  0.4 MG Caps capsule Commonly known as: FLOMAX  Take 1 capsule (0.4 mg total) by mouth daily.        Allergies:  Allergies  Allergen Reactions   Shellfish Allergy Hives   Shellfish Allergy Hives   Amlodipine Other (See Comments)    Possible hand swelling   Lisinopril Other (See Comments)    Cough   Tape Rash    [place], elastic tape used by physical therapist; skin breaks out    Family History: Family History  Problem Relation Age of Onset   Heart disease Mother    Colon cancer Mother    Diabetes Father    Prostate cancer Father    Colon cancer Brother    Heart disease Brother    Kidney cancer Neg Hx    Bladder  Cancer Neg Hx     Social History:  reports that he has never smoked. He has never used smokeless tobacco. He reports current alcohol use of about 2.0 standard drinks of alcohol per week. He reports that he does not use drugs.  ROS: Pertinent ROS in HPI  Physical Exam: There were no vitals taken for this visit.  Constitutional:  Well nourished. Alert and oriented, No acute distress. HEENT: [place] AT, moist mucus membranes.  Trachea midline, no masses. Cardiovascular: No clubbing, cyanosis, or edema. Respiratory: Normal respiratory effort, no increased work of breathing. GI: Abdomen is soft, non tender, non distended, no abdominal masses. Liver and spleen not palpable.  No hernias appreciated.  Stool sample for occult testing is not indicated.   GU: No CVA tenderness.  No bladder fullness or masses.  Patient with circumcised/uncircumcised phallus. ***Foreskin easily retracted***  Urethral meatus is patent.  No penile discharge. No penile lesions or rashes. Scrotum without lesions, cysts, rashes and/or edema.  Testicles are located scrotally bilaterally. No masses are appreciated in the testicles. Left and right epididymis are normal. Rectal: Patient with  normal sphincter tone. Anus and perineum without scarring or rashes. No rectal masses are appreciated. Prostate is approximately *** grams, *** nodules are appreciated. Seminal vesicles are normal. Skin: No rashes, bruises or suspicious lesions. Lymph: No cervical or inguinal adenopathy. Neurologic: Grossly intact, no focal deficits, moving all 4 extremities. Psychiatric: Normal mood and affect.   Laboratory Data: See EPIC and HPI I have reviewed the labs.  See HPI.      Pertinent Imaging: N/A  Assessment & Plan:    1. BPH with LU TS -PSA stable  -continue tamsulosin  0.4 mg daily  2. Nocturia -***  No follow-ups on file.  These notes generated with voice recognition software. I apologize for typographical errors.  CLOTILDA HELON RIGGERS  [facility] [address]  [address] [place], [place] [address] [phone]

## 2023-10-28 ENCOUNTER — Ambulatory Visit: Admitting: Urology

## 2023-10-28 DIAGNOSIS — N138 Other obstructive and reflux uropathy: Secondary | ICD-10-CM

## 2023-10-28 DIAGNOSIS — R351 Nocturia: Secondary | ICD-10-CM

## 2023-11-01 ENCOUNTER — Other Ambulatory Visit: Payer: Self-pay | Admitting: Urology

## 2023-11-01 DIAGNOSIS — R351 Nocturia: Secondary | ICD-10-CM

## 2024-04-05 ENCOUNTER — Ambulatory Visit: Admitting: Student in an Organized Health Care Education/Training Program
